# Patient Record
Sex: Female | Born: 1995 | Race: White | Hispanic: No | Marital: Single | Smoking: Current some day smoker
Health system: Southern US, Community
[De-identification: ages and names within clinical notes are randomized; demographics above are authoritative.]

## PROBLEM LIST (undated history)

## (undated) ENCOUNTER — Emergency Department: Discharge status: Absent without leave | Payer: Medicaid Other

## (undated) DIAGNOSIS — J309 Allergic rhinitis, unspecified: Secondary | ICD-10-CM

## (undated) DIAGNOSIS — E119 Type 2 diabetes mellitus without complications: Secondary | ICD-10-CM

## (undated) DIAGNOSIS — R569 Unspecified convulsions: Secondary | ICD-10-CM

## (undated) DIAGNOSIS — F909 Attention-deficit hyperactivity disorder, unspecified type: Secondary | ICD-10-CM

## (undated) DIAGNOSIS — K819 Cholecystitis, unspecified: Secondary | ICD-10-CM

## (undated) DIAGNOSIS — F319 Bipolar disorder, unspecified: Secondary | ICD-10-CM

## (undated) DIAGNOSIS — J45909 Unspecified asthma, uncomplicated: Secondary | ICD-10-CM

## (undated) HISTORY — PX: ADENOIDECTOMY: SUR15

## (undated) HISTORY — PX: TONSILLECTOMY: SUR1361

---

## 2016-10-15 ENCOUNTER — Emergency Department
Admission: EM | Admit: 2016-10-15 | Discharge: 2016-10-16 | Disposition: A | Discharge status: Home / Self Care | Payer: Medicaid - Out of State | Attending: Emergency Medicine | Admitting: Emergency Medicine

## 2016-10-15 DIAGNOSIS — J45909 Unspecified asthma, uncomplicated: Secondary | ICD-10-CM | POA: Insufficient documentation

## 2016-10-15 DIAGNOSIS — T7840XA Allergy, unspecified, initial encounter: Secondary | ICD-10-CM

## 2016-10-15 DIAGNOSIS — T782XXA Anaphylactic shock, unspecified, initial encounter: Secondary | ICD-10-CM | POA: Diagnosis not present

## 2016-10-15 DIAGNOSIS — Z87891 Personal history of nicotine dependence: Secondary | ICD-10-CM | POA: Diagnosis not present

## 2016-10-15 HISTORY — DX: Unspecified convulsions: R56.9

## 2016-10-15 HISTORY — DX: Unspecified asthma, uncomplicated: J45.909

## 2016-10-15 HISTORY — DX: Allergic rhinitis, unspecified: J30.9

## 2016-10-15 HISTORY — DX: Bipolar disorder, unspecified: F31.9

## 2016-10-16 LAB — BASIC METABOLIC PANEL
ANION GAP: 9 (ref 5–15)
BUN: 13 mg/dL (ref 6–20)
CHLORIDE: 105 mmol/L (ref 101–111)
CO2: 26 mmol/L (ref 22–32)
Calcium: 9.1 mg/dL (ref 8.9–10.3)
Creatinine, Ser: 0.77 mg/dL (ref 0.44–1.00)
GFR calc non Af Amer: >60 mL/min (ref >60)
GLUCOSE: 137 mg/dL — AB (ref 65–99)
POTASSIUM: 3.7 mmol/L (ref 3.5–5.1)
Sodium: 140 mmol/L (ref 135–145)

## 2016-10-16 LAB — CBC
HEMATOCRIT: 41 % (ref 35.0–47.0)
HEMOGLOBIN: 13.6 g/dL (ref 12.0–16.0)
MCH: 26.8 pg (ref 26.0–34.0)
MCHC: 33.1 g/dL (ref 32.0–36.0)
MCV: 81.1 fL (ref 80.0–100.0)
Platelets: 338 10*3/uL (ref 150–440)
RBC: 5.06 MIL/uL (ref 3.80–5.20)
RDW: 14.5 % (ref 11.5–14.5)
WBC: 23.6 10*3/uL — AB (ref 3.6–11.0)

## 2016-10-16 MED ORDER — EPINEPHRINE 0.3 MG/0.3ML IJ SOAJ
0.3000 mg | Freq: Once | INTRAMUSCULAR | Status: AC
  Administered 2016-10-16: 0.3 mg via INTRAMUSCULAR
  Filled 2016-10-16: qty 0.3

## 2016-10-16 MED ORDER — SODIUM CHLORIDE 0.9 % IV BOLUS (SEPSIS)
1000.0000 mL | Freq: Once | INTRAVENOUS | Status: AC
  Administered 2016-10-16: 1000 mL via INTRAVENOUS

## 2016-10-16 MED ORDER — ONDANSETRON HCL 4 MG/2ML IJ SOLN
4.0000 mg | Freq: Once | INTRAMUSCULAR | Status: AC
  Administered 2016-10-16: 4 mg via INTRAVENOUS

## 2016-10-16 MED ORDER — PREDNISONE 20 MG PO TABS: 60.0000 mg | ORAL_TABLET | Freq: Every day | ORAL | 0 refills | Status: DC

## 2016-10-16 MED ORDER — ONDANSETRON HCL 4 MG/2ML IJ SOLN
INTRAMUSCULAR | Status: AC
  Filled 2016-10-16: qty 2

## 2016-10-16 NOTE — ED Notes (Addendum)
Pt reports multiple allergies, and has hx of airborne allergies. Pt reports being around an opened box of ginger snaps and pt has hx of cinnamon allergy. Pt states once seeing ginger snaps she dev itchiness and "fire ant bite" sensation to body. Pt also reports SHOB and chest tightness. Pt given medication by EMS, on arrival pt reports breathing is better at this time and chest tightness has lessened.   Hx of hospitalization in the past due to recurrent allergic rxns.

## 2016-10-16 NOTE — ED Notes (Signed)
Per EDP, pt can drink water, pt drank water without difficulty.
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
## 2016-10-16 NOTE — Clinical Social Work Note (Signed)
CSW consulted for placement. Patient is from the homeless shelter and came in due to an allergic reaction she had to ginger snaps. Patient will need to have an Epi Pen obtained for her by RN CM. Patient will more than likely need to return to the homeless shelter at discharge from ED. CSW has left message for Gaye PollackRion Thompson: Outreach Coordinator: [phone] to discuss this further. CSW has updated patient's ED nurse, Vernona RiegerLaura. York SpanielMonica Jo-Ann Johanning MSW,LCSW [phone]

## 2016-10-16 NOTE — ED Notes (Signed)
Monica CSW spoke with Dr. Alphonzo LemmingsMcShane about pt plan of care.

## 2016-10-16 NOTE — ED Triage Notes (Signed)
Per EMS, pt had airborne allergic rxn to cinnamon. Pt states hx of airborne allergy and states she was around someone who opened ginger snaps and with pt's allergy to cinnamon she began to itch and "felt like fire ants all over my body" as well as SHOB and chest tightness. Per EMS, they gave 50 benadryl, .3epi, 125 solumedrol and pepcid. Pt states she is breathing better at this time and the "fire ants aren't as bad." Pt states last episode she had to be hospitalized. Pt able to swallow and maintain saliva at this time, 99% on RA. Pt able to answer questions without difficulty at this time.

## 2016-10-16 NOTE — ED Notes (Signed)
Spoke with Liberty from CSW. She states that if we are able to provide an epipen for the patient, she should be able to return to shelter. MD and Elnita Maxwell with case management made aware.

## 2016-10-16 NOTE — ED Notes (Signed)
Pt reports feeling nauseous, EDP notified, see MAR for follow up.

## 2016-10-16 NOTE — Care Management Note (Signed)
Case Management Note  Patient Details  Name: Sabrina HatchetJade Germond MRN: [id number] Date of Birth: 03-Jan-1996  Subjective/Objective:     Called pharmacy about pending medication assist with Epipen, and the pharmacy has now stocked out Pyxis. So the MD will write an order for it to be dispensed. RN for the patient is aware. No further needs identified.               Action/Plan:   Expected Discharge Date:                  Expected Discharge Plan:     In-House Referral:     Discharge planning Services     Post Acute Care Choice:    Choice offered to:     DME Arranged:    DME Agency:     HH Arranged:    HH Agency:     Status of Service:     If discussed at [employer] Length of Stay Meetings, dates discussed:    Additional Comments:  Berna BueCheryl Janssen Zee, RN 10/16/2016, 11:13 AM

## 2016-10-16 NOTE — ED Notes (Signed)
Monica from CSW stated that pt and mom wanted to leave and "take a chance" with being able to go back to homeless shelter. Mom drove to ED and will take pt back to gather belongings from homeless shelter. Dr. Alphonzo LemmingsMcShane updated and writing DC papers.

## 2016-10-16 NOTE — ED Notes (Signed)
[facility] Production designer, theatre/television/filmmanager about epi-pen for pt. CSW Maxine GlennMonica has stated that pt will need epi-pen to return to homeless shelter. Cheryl called pharmacy about sending prescription for epi-pen to be filled and then sent up to ED for pt to take with her. Pharmacy stated that ED has epi-pens in pyxis and the doctor could write for this RN to pull epi-pen out of pyxis and give to pt upon DC. Informed Dr. Alphonzo LemmingsMcShane about this plan. He and Dr. Cyril LoosenKinner verbalized concern about epi-pen from pyxis. Cheryl case manager talked to both doctors and informed that is what pharmacy stated to her. Both MD's verbalized understanding.
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
## 2016-10-20 ENCOUNTER — Emergency Department
Admission: EM | Admit: 2016-10-20 | Discharge: 2016-10-20 | Disposition: A | Discharge status: Home / Self Care | Payer: Medicaid - Out of State | Attending: Emergency Medicine | Admitting: Emergency Medicine

## 2016-10-20 ENCOUNTER — Emergency Department: Payer: Medicaid - Out of State

## 2016-10-20 ENCOUNTER — Emergency Department
Admission: EM | Admit: 2016-10-20 | Discharge: 2016-10-20 | Disposition: A | Discharge status: Home / Self Care | Payer: Medicaid Other | Attending: Emergency Medicine | Admitting: Emergency Medicine

## 2016-10-20 ENCOUNTER — Encounter: Payer: Self-pay | Admitting: Emergency Medicine

## 2016-10-20 DIAGNOSIS — Z87891 Personal history of nicotine dependence: Secondary | ICD-10-CM | POA: Insufficient documentation

## 2016-10-20 DIAGNOSIS — Y929 Unspecified place or not applicable: Secondary | ICD-10-CM | POA: Diagnosis not present

## 2016-10-20 DIAGNOSIS — S93402A Sprain of unspecified ligament of left ankle, initial encounter: Secondary | ICD-10-CM | POA: Diagnosis not present

## 2016-10-20 DIAGNOSIS — Y999 Unspecified external cause status: Secondary | ICD-10-CM | POA: Diagnosis not present

## 2016-10-20 DIAGNOSIS — J45909 Unspecified asthma, uncomplicated: Secondary | ICD-10-CM | POA: Diagnosis not present

## 2016-10-20 DIAGNOSIS — S99912A Unspecified injury of left ankle, initial encounter: Secondary | ICD-10-CM | POA: Diagnosis present

## 2016-10-20 DIAGNOSIS — T7840XA Allergy, unspecified, initial encounter: Secondary | ICD-10-CM | POA: Diagnosis not present

## 2016-10-20 DIAGNOSIS — W19XXXA Unspecified fall, initial encounter: Secondary | ICD-10-CM | POA: Insufficient documentation

## 2016-10-20 DIAGNOSIS — R600 Localized edema: Secondary | ICD-10-CM | POA: Diagnosis present

## 2016-10-20 DIAGNOSIS — Y939 Activity, unspecified: Secondary | ICD-10-CM | POA: Insufficient documentation

## 2016-10-20 MED ORDER — FAMOTIDINE IN NACL 20-0.9 MG/50ML-% IV SOLN
20.0000 mg | Freq: Once | INTRAVENOUS | Status: AC
  Administered 2016-10-20: 20 mg via INTRAVENOUS
  Filled 2016-10-20: qty 50

## 2016-10-20 MED ORDER — OXYCODONE-ACETAMINOPHEN 7.5-325 MG PO TABS: 1.0000 | ORAL_TABLET | Freq: Four times a day (QID) | ORAL | 0 refills | Status: DC | PRN

## 2016-10-20 MED ORDER — OXYCODONE-ACETAMINOPHEN 5-325 MG PO TABS
1.0000 | ORAL_TABLET | Freq: Once | ORAL | Status: AC
  Administered 2016-10-20: 1 via ORAL
  Filled 2016-10-20: qty 1

## 2016-10-20 MED ORDER — METHYLPREDNISOLONE SODIUM SUCC 125 MG IJ SOLR
125.0000 mg | Freq: Once | INTRAMUSCULAR | Status: AC
  Administered 2016-10-20: 125 mg via INTRAVENOUS
  Filled 2016-10-20: qty 2

## 2016-10-20 MED ORDER — PREDNISONE 50 MG PO TABS: 50.0000 mg | ORAL_TABLET | Freq: Every day | ORAL | 0 refills | Status: DC

## 2016-10-20 NOTE — ED Notes (Signed)
Discussed with patient medication/prescription. Pt stated she discussed this with the MD. Did not like or want prednisone and probably would not fill prescription anyways. This nurse asked to clarify, however pt stated "no, I just want to be discharged/ go home".

## 2016-10-20 NOTE — ED Provider Notes (Signed)
[facility] Emergency Department Provider Note   ____________________________________________   First MD Initiated Contact with Patient 10/20/16 2138     (approximate)  I have reviewed the triage vital signs and the nursing notes.   HISTORY  Chief Complaint Fall and Ankle Pain  HPI Sabrina Blevins is a 21 y.o. female Patient presents with left ankle pain secondary to a fall. Patient has a went beneath her and she fell on her ankle. Patient stated this obvious deformity. Patient rates the pain between 7 and 8/10. Patient immediately ice the ankle after fall. No other palliative measures for complaint.   Past Medical History:  Diagnosis Date  . Airborne allergy, current reaction   . Asthma   . Bipolar disorder (HCC)   . Seizures (HCC)     There are no active problems to display for this patient.   Past Surgical History:  Procedure Laterality Date  . ADENOIDECTOMY    . TONSILLECTOMY      Prior to Admission medications   Medication Sig Start Date End Date Taking? Authorizing Provider  oxyCODONE-acetaminophen (PERCOCET) 7.5-325 MG tablet Take 1 tablet by mouth every 6 (six) hours as needed for severe pain. 10/20/16   Joni Reining, PA-C  predniSONE (DELTASONE) 50 MG tablet Take 1 tablet (50 mg total) by mouth daily with breakfast. 10/20/16   Jene Every, MD    Allergies Bee venom; Cinnamon; Contrast media [iodinated diagnostic agents]; Motrin [ibuprofen]; Nutmeg oil (myristica oil); Penicillins; and Topamax [topiramate]  No family history on file.  Social History Social History  Substance Use Topics  . Smoking status: Former Games developer  . Smokeless tobacco: Never Used  . Alcohol use No    Review of Systems  Constitutional: No fever/chills Eyes: No visual changes. ENT: No sore throat. Cardiovascular: Denies chest pain. Respiratory: Denies shortness of breath. Gastrointestinal: No abdominal pain.  No nausea, no vomiting.  No diarrhea.  No  constipation. Genitourinary: Negative for dysuria. Musculoskeletal: Negative for back pain. Skin: Negative for rash. Neurological: Negative for headaches, focal weakness or numbness. Psychiatric:Bipolar Allergic/Immunilogical: See medication list ____________________________________________   PHYSICAL EXAM:  VITAL SIGNS: ED Triage Vitals  Enc Vitals Group     BP --      Pulse Rate 10/20/16 2118 (!) 125     Resp 10/20/16 2118 20     Temp 10/20/16 2118 98.2 F (36.8 C)     Temp Source 10/20/16 2118 Oral     SpO2 10/20/16 2118 98 %     Weight --      Height --      Head Circumference --      Peak Flow --      Pain Score 10/20/16 2117 8     Pain Loc --      Pain Edu? --      Excl. in GC? --     Constitutional: Alert and oriented. Well appearing and in no acute distress. Morbid obesity Cardiovascular: Initial tachycardic have retaken pulse which is in normal range. Grossly normal heart sounds.  Good peripheral circulation. Respiratory: Normal respiratory effort.  No retractions. Lungs CTAB. Musculoskeletal: No obvious deformity to the left ankle in comparison to the right. Patient is moderate edema to the medial aspect of the left ankle. Patient unable to weight-bear secondary to complain of pain.  Neurologic:  Normal speech and language. No gross focal neurologic deficits are appreciated. No gait instability. Skin:  Skin is warm, dry and intact. No rash noted. Psychiatric: Mood  and affect are normal. Speech and behavior are normal.  ____________________________________________   LABS (all labs ordered are listed, but only abnormal results are displayed)  Labs Reviewed - No data to display ____________________________________________  EKG   ____________________________________________  RADIOLOGY  Dg Ankle Complete Left  Result Date: 10/20/2016 CLINICAL DATA:  Acute left ankle pain and swelling following fall. Initial encounter. EXAM: LEFT ANKLE COMPLETE - 3+ VIEW  COMPARISON:  None. FINDINGS: There is no evidence of acute fracture, subluxation or dislocation. Medial soft tissue swelling is noted. No suspicious focal bony lesions are present. The joint spaces are unremarkable. IMPRESSION: Soft tissue swelling without acute bony abnormality. Electronically Signed   By: Harmon PierJeffrey  Hu M.D.   On: 10/20/2016 21:56    ____________________________________________   PROCEDURES  Procedure(s) performed: None  Procedures  Critical Care performed: No  ____________________________________________   INITIAL IMPRESSION / ASSESSMENT AND PLAN / ED COURSE  Pertinent labs & imaging results that were available during my care of the patient were reviewed by me and considered in my medical decision making (see chart for details).  Left ankle pain secondary to sprain. Discussed x-ray finding with patient. Patient placed in a ankle stirrup splint and given crutches to assist ambulation. Patient given a work note for 2 days. [facility].      ____________________________________________   FINAL CLINICAL IMPRESSION(S) / ED DIAGNOSES  Final diagnoses:  Sprain of left ankle, unspecified ligament, initial encounter      NEW MEDICATIONS STARTED DURING THIS VISIT:  Discharge Medication List as of 10/20/2016 10:08 PM    START taking these medications   Details  oxyCODONE-acetaminophen (PERCOCET) 7.5-325 MG tablet Take 1 tablet by mouth every 6 (six) hours as needed for severe pain., Starting Sun 10/20/2016, Print         Note:  This document was prepared using Dragon voice recognition software and may include unintentional dictation errors.    Joni ReiningSmith, Ronald K, PA-C 10/20/16 2223    Sharman CheekStafford, Phillip, MD 10/20/16 [phone]

## 2016-10-20 NOTE — ED Triage Notes (Signed)
Patient presents with deformity to left ankle after a fall. States her left leg went behind her and she fell on her ankle.

## 2016-10-20 NOTE — ED Provider Notes (Signed)
[facility] Emergency Department Provider Note   ____________________________________________    I have reviewed the triage vital signs and the nursing notes.   HISTORY  Chief Complaint Allergic Reaction     HPI Sabrina Blevins is a 21 y.o. female who presents after an allergic reaction. Patient reports she has an allergy to cinnamon and she smelled it today and developed a sensation of her skin being on fire which she states is typical of her allergic reaction. She also reports some mild facial swelling. No throat swelling or difficulty breathing. Her mother gave her an EpiPen and she had 50 mg of by mouth Benadryl as well. Currently she reports she is feeling better   Past Medical History:  Diagnosis Date  . Airborne allergy, current reaction   . Asthma   . Bipolar disorder (HCC)   . Seizures (HCC)     There are no active problems to display for this patient.   Past Surgical History:  Procedure Laterality Date  . ADENOIDECTOMY    . TONSILLECTOMY      Prior to Admission medications   Medication Sig Start Date End Date Taking? Authorizing Provider  predniSONE (DELTASONE) 20 MG tablet Take 3 tablets (60 mg total) by mouth daily. 10/16/16  Yes Jeanmarie PlantMcShane, James A, MD     Allergies Bee venom; Cinnamon; Contrast media [iodinated diagnostic agents]; Motrin [ibuprofen]; Nutmeg oil (myristica oil); Penicillins; and Topamax [topiramate]  History reviewed. No pertinent family history.  Social History Social History  Substance Use Topics  . Smoking status: Former Games developermoker  . Smokeless tobacco: Never Used  . Alcohol use No    Review of Systems  Constitutional: No Dizziness Eyes: No Swelling  ENT: No Throat swelling Cardiovascular: Denies chest pain. Respiratory: Denies shortness of breath. Gastrointestinal: No abdominal pain.  No nausea, no vomiting.    Musculoskeletal: Negative for back pain. Skin: Negative for rash. Neurological: Negative  for headaches    ____________________________________________   PHYSICAL EXAM:  VITAL SIGNS: ED Triage Vitals  Enc Vitals Group     BP 10/20/16 1015 (!) 136/98     Pulse Rate 10/20/16 1015 81     Resp 10/20/16 1015 18     Temp 10/20/16 1019 98.3 F (36.8 C)     Temp Source 10/20/16 1015 Oral     SpO2 10/20/16 1015 99 %     Weight 10/20/16 1015 136.1 kg (300 lb)     Height 10/20/16 1015 1.702 m (5\' 7" )     Head Circumference --      Peak Flow --      Pain Score 10/20/16 1015 0     Pain Loc --      Pain Edu? --      Excl. in GC? --     Constitutional: Alert and oriented. No acute distress.  Eyes: Conjunctivae are normal.   Nose: No congestion/rhinnorhea. Mouth/Throat: Mucous membranes are moist. Pharynx without swelling  Neck:  Painless ROM, no stridor Cardiovascular: Normal rate, regular rhythm. Good peripheral circulation. Respiratory: Normal respiratory effort.  No retractions. Lungs CTAB. Gastrointestinal: Soft and nontender.  Genitourinary: deferred Musculoskeletal: .  Warm and well perfused Neurologic:  Normal speech and language. No gross focal neurologic deficits are appreciated.  Skin:  Skin is warm, dry and intact. No rash noted. Psychiatric: Mood and affect are normal. Speech and behavior are normal.  ____________________________________________   LABS (all labs ordered are listed, but only abnormal results are displayed)  Labs Reviewed -  No data to display ____________________________________________  EKG  None ____________________________________________  RADIOLOGY  None ____________________________________________   PROCEDURES  Procedure(s) performed: No    Critical Care performed: No ____________________________________________   INITIAL IMPRESSION / ASSESSMENT AND PLAN / ED COURSE  Pertinent labs & imaging results that were available during my care of the patient were reviewed by me and considered in my medical decision making  (see chart for details).  Patient well-appearing and in no acute distress currently. We will give IV steroids IV Pepcid and monitor in the emergency department  Patient monitored with no recurrence of symptoms. Will d/c with prednisone   ____________________________________________   FINAL CLINICAL IMPRESSION(S) / ED DIAGNOSES  Final diagnoses:  Allergic reaction, initial encounter      NEW MEDICATIONS STARTED DURING THIS VISIT:  New Prescriptions   No medications on file     Note:  This document was prepared using Dragon voice recognition software and may include unintentional dictation errors.    Jene Every, MD 10/20/16 [phone]

## 2016-10-20 NOTE — ED Triage Notes (Addendum)
Pt presents to ED via [facility] c/o allergic reaction. Pt reports she has an airborne allegy to cinnamon. Is currently staying at a shelter with mother and shelter staff made cinnamon rolls this morning that pt could smell from down the hall. Pt states reaction felt "like fire ants all over my body." No hives or difficulty breathing noted at this time. Mother gave pt 50mg  benadryl around 0930 and an epi-pen injection at 0950.

## 2016-10-20 NOTE — Discharge Instructions (Signed)
Wear splint and ambulate with support for 2-3 days as needed.

## 2016-10-29 ENCOUNTER — Emergency Department: Admission: EM | Admit: 2016-10-29 | Payer: Self-pay | Source: Home / Self Care

## 2016-10-30 ENCOUNTER — Encounter: Payer: Self-pay | Admitting: Emergency Medicine

## 2016-10-30 ENCOUNTER — Emergency Department
Admission: EM | Admit: 2016-10-30 | Discharge: 2016-10-30 | Disposition: A | Discharge status: Home / Self Care | Payer: Medicaid - Out of State | Attending: Emergency Medicine | Admitting: Emergency Medicine

## 2016-10-30 DIAGNOSIS — L509 Urticaria, unspecified: Secondary | ICD-10-CM | POA: Diagnosis present

## 2016-10-30 DIAGNOSIS — J45909 Unspecified asthma, uncomplicated: Secondary | ICD-10-CM | POA: Diagnosis not present

## 2016-10-30 DIAGNOSIS — T7840XA Allergy, unspecified, initial encounter: Secondary | ICD-10-CM | POA: Insufficient documentation

## 2016-10-30 DIAGNOSIS — Z87891 Personal history of nicotine dependence: Secondary | ICD-10-CM | POA: Diagnosis not present

## 2016-10-30 MED ORDER — FAMOTIDINE IN NACL 20-0.9 MG/50ML-% IV SOLN
20.0000 mg | Freq: Once | INTRAVENOUS | Status: AC
  Administered 2016-10-30: 20 mg via INTRAVENOUS
  Filled 2016-10-30: qty 50

## 2016-10-30 MED ORDER — EPINEPHRINE 0.3 MG/0.3ML IJ SOAJ
0.3000 mg | Freq: Once | INTRAMUSCULAR | Status: AC
  Administered 2016-10-30: 0.3 mg via INTRAMUSCULAR
  Filled 2016-10-30: qty 0.3

## 2016-10-30 MED ORDER — PREDNISONE 20 MG PO TABS: 60.0000 mg | ORAL_TABLET | Freq: Every day | ORAL | 0 refills | Status: AC

## 2016-10-30 MED ORDER — DIPHENHYDRAMINE HCL 50 MG/ML IJ SOLN
25.0000 mg | Freq: Once | INTRAMUSCULAR | Status: AC
  Administered 2016-10-30: 25 mg via INTRAVENOUS
  Filled 2016-10-30: qty 1

## 2016-10-30 NOTE — ED Notes (Signed)
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
## 2016-10-30 NOTE — ED Notes (Signed)
Pt calls out requesting RN. THis RN to room, pt c/o worsening rash to face and chest. Rash appears to be unchanged by this RN, MD consulted. MD agreed, but complied with giving pt additional meds as requested.

## 2016-10-30 NOTE — ED Notes (Signed)
Mother at bedside and pt voice continual concerns for need for admission, need for shelter, need for protection. This rn and md reassure pt and family additional resource attempts to be made.

## 2016-10-30 NOTE — ED Triage Notes (Addendum)
Pt bib [facility] from homeless shelter where she is currently staying with her mother. Pt recently seen here for same. Pt exposed to cinnamon, c/o chest tightness, bright red hives (reddened puffy skin on arrival), clear lungs. 50 benadryl given IV, 125 solumedrol though IV. #20 left AC established. Pt in NAD upon arrival

## 2016-11-16 ENCOUNTER — Emergency Department
Admission: EM | Admit: 2016-11-16 | Discharge: 2016-11-16 | Disposition: A | Discharge status: Home / Self Care | Payer: Medicaid - Out of State | Attending: Emergency Medicine | Admitting: Emergency Medicine

## 2016-11-16 ENCOUNTER — Encounter: Payer: Self-pay | Admitting: Emergency Medicine

## 2016-11-16 DIAGNOSIS — Z87891 Personal history of nicotine dependence: Secondary | ICD-10-CM | POA: Insufficient documentation

## 2016-11-16 DIAGNOSIS — S90462A Insect bite (nonvenomous), left great toe, initial encounter: Secondary | ICD-10-CM | POA: Diagnosis not present

## 2016-11-16 DIAGNOSIS — Y929 Unspecified place or not applicable: Secondary | ICD-10-CM | POA: Insufficient documentation

## 2016-11-16 DIAGNOSIS — Y939 Activity, unspecified: Secondary | ICD-10-CM | POA: Diagnosis not present

## 2016-11-16 DIAGNOSIS — Y999 Unspecified external cause status: Secondary | ICD-10-CM | POA: Insufficient documentation

## 2016-11-16 DIAGNOSIS — W57XXXA Bitten or stung by nonvenomous insect and other nonvenomous arthropods, initial encounter: Secondary | ICD-10-CM | POA: Diagnosis not present

## 2016-11-16 DIAGNOSIS — J45909 Unspecified asthma, uncomplicated: Secondary | ICD-10-CM | POA: Insufficient documentation

## 2016-11-16 DIAGNOSIS — R600 Localized edema: Secondary | ICD-10-CM | POA: Diagnosis present

## 2016-11-16 LAB — GLUCOSE, CAPILLARY: Glucose-Capillary: 173 mg/dL — ABNORMAL HIGH (ref 65–99)

## 2016-11-16 MED ORDER — DIPHENHYDRAMINE HCL 25 MG PO CAPS
50.0000 mg | ORAL_CAPSULE | Freq: Once | ORAL | Status: AC
  Administered 2016-11-16: 50 mg via ORAL
  Filled 2016-11-16: qty 2

## 2016-11-16 MED ORDER — ACETAMINOPHEN 500 MG PO TABS
1000.0000 mg | ORAL_TABLET | Freq: Once | ORAL | Status: AC
  Administered 2016-11-16: 1000 mg via ORAL
  Filled 2016-11-16: qty 2

## 2016-11-16 MED ORDER — TRIAMCINOLONE ACETONIDE 0.5 % EX OINT: 1.0000 "application " | TOPICAL_OINTMENT | Freq: Two times a day (BID) | CUTANEOUS | 0 refills | Status: DC

## 2016-11-16 NOTE — ED Triage Notes (Signed)
Pt to ed with c/o ?insect bite to left foot first toe.  Small amount of redness noted. No swelling noted.

## 2016-11-16 NOTE — ED Notes (Signed)
Entered pt's room to administer tylenol and benadryl. Pt informed of names of medications before taking them. After taking pt asked if the benadryl was name brand or generic because she has a swelling reaction to generic. Unsure which formulation is used. PA notified and benadryl added to allergy list.

## 2016-11-16 NOTE — ED Provider Notes (Signed)
[facility] Emergency Department Provider Note   ____________________________________________   First MD Initiated Contact with Patient 11/16/16 1326     (approximate)  I have reviewed the triage vital signs and the nursing notes.   HISTORY  Chief Complaint Insect Bite   HPI Sabrina Blevins is a 21 y.o. female is here with complaint of possible insect bite to her left great toe. Patient states that swallowing she felt something that was sharp. Since that time she has had pain and swelling to the area. Patient is fearful that her toe may become infected. Patient denies any difficulty breathing or swallowing. Currently she complains of itching and pain. Patient has a history of "borderline diabetes". Currently she rates her pain as 6/10.   Past Medical History:  Diagnosis Date  . Airborne allergy, current reaction   . Asthma   . Bipolar disorder (HCC)   . Seizures (HCC)     There are no active problems to display for this patient.   Past Surgical History:  Procedure Laterality Date  . ADENOIDECTOMY    . TONSILLECTOMY      Prior to Admission medications   Medication Sig Start Date End Date Taking? Authorizing Provider  oxyCODONE-acetaminophen (PERCOCET) 7.5-325 MG tablet Take 1 tablet by mouth every 6 (six) hours as needed for severe pain. 10/20/16   Joni Reining, PA-C  predniSONE (DELTASONE) 50 MG tablet Take 1 tablet (50 mg total) by mouth daily with breakfast. 10/20/16   Jene Every, MD  triamcinolone ointment (KENALOG) 0.5 % Apply 1 application topically 2 (two) times daily. 11/16/16   Tommi Rumps, PA-C    Allergies Bee venom; Cinnamon; Contrast media [iodinated diagnostic agents]; Motrin [ibuprofen]; Nutmeg oil (myristica oil); Penicillins; Benadryl [diphenhydramine hcl]; and Topamax [topiramate]  History reviewed. No pertinent family history.  Social History Social History  Substance Use Topics  . Smoking status: Former Games developer  .  Smokeless tobacco: Never Used  . Alcohol use No    Review of Systems  Constitutional: No fever/chills Cardiovascular: Denies chest pain. Respiratory: Denies shortness of breath. Gastrointestinal: No abdominal pain.  No nausea, no vomiting.   Musculoskeletal: Pain left great toe. Skin: Positive for erythema left great toe. Neurological: Negative for headaches, focal weakness or numbness. ____________________________________________   PHYSICAL EXAM:  VITAL SIGNS: ED Triage Vitals  Enc Vitals Group     BP 11/16/16 1303 111/82     Pulse Rate 11/16/16 1303 97     Resp 11/16/16 1303 18     Temp 11/16/16 1303 97.9 F (36.6 C)     Temp Source 11/16/16 1303 Oral     SpO2 11/16/16 1303 99 %     Weight 11/16/16 1304 300 lb (136.1 kg)     Height --      Head Circumference --      Peak Flow --      Pain Score 11/16/16 1303 6     Pain Loc --      Pain Edu? --      Excl. in GC? --    Constitutional: Alert and oriented. Well appearing and in no acute distress. Eyes: Conjunctivae are normal.  Head: Atraumatic. Nose: No congestion/rhinnorhea. Neck: No stridor.   Cardiovascular: Normal rate, regular rhythm. Grossly normal heart sounds.  Good peripheral circulation. Respiratory: Normal respiratory effort.  No retractions. Lungs CTAB. Musculoskeletal: No lower extremity tenderness nor edema.  No joint effusions. Neurologic:  Normal speech and language. No gross focal neurologic deficits are appreciated.  No gait instability. Skin:  Skin is warm, dry.  Dorsal aspect of left great toe is erythematous and tender to touch. There is 2 small superficial open areas without any bleeding or drainage. Motor sensory function intact and capillary refill is less than 3 seconds. Psychiatric: Mood and affect are normal. Speech and behavior are normal.  ____________________________________________   LABS (all labs ordered are listed, but only abnormal results are displayed)  Labs Reviewed  GLUCOSE,  CAPILLARY - Abnormal; Notable for the following:       Result Value   Glucose-Capillary 173 (*)    All other components within normal limits  CBG MONITORING, ED     PROCEDURES  Procedure(s) performed: None  Procedures  Critical Care performed: No  ____________________________________________   INITIAL IMPRESSION / ASSESSMENT AND PLAN / ED COURSE  Pertinent labs & imaging results that were available during my care of the patient were reviewed by me and considered in my medical decision making (see chart for details).  Patient was given Benadryl and Tylenol while in the department for pain and itching. She is given a prescription for Kenalog cream to apply to area as needed for itching. She is continue taking Tylenol as needed for pain. She is to follow-up with her PCP if any continued problems or one of  the clinics listed on her discharge papers. Patient also was made aware that her nonfasting blood sugar was elevated and should be rechecked.      ____________________________________________   FINAL CLINICAL IMPRESSION(S) / ED DIAGNOSES  Final diagnoses:  Insect bite, initial encounter      NEW MEDICATIONS STARTED DURING THIS VISIT:  Discharge Medication List as of 11/16/2016  3:03 PM    START taking these medications   Details  triamcinolone ointment (KENALOG) 0.5 % Apply 1 application topically 2 (two) times daily., Starting Sat 11/16/2016, Print         Note:  This document was prepared using Dragon voice recognition software and may include unintentional dictation errors.    Tommi Rumps, PA-C 11/16/16 Silva Bandy    Merrily Brittle, MD 11/17/16 1055

## 2016-11-16 NOTE — Discharge Instructions (Signed)
Follow-up with one of the 2 clinics listed on her discharge papers. Begin using triamcinolone cream to the area and take Tylenol as needed for pain.

## 2017-01-11 ENCOUNTER — Encounter: Payer: Self-pay | Admitting: Emergency Medicine

## 2017-01-11 ENCOUNTER — Other Ambulatory Visit: Payer: Self-pay

## 2017-01-11 DIAGNOSIS — F17210 Nicotine dependence, cigarettes, uncomplicated: Secondary | ICD-10-CM | POA: Diagnosis not present

## 2017-01-11 DIAGNOSIS — R112 Nausea with vomiting, unspecified: Secondary | ICD-10-CM | POA: Insufficient documentation

## 2017-01-11 DIAGNOSIS — E860 Dehydration: Secondary | ICD-10-CM | POA: Diagnosis not present

## 2017-01-11 DIAGNOSIS — R42 Dizziness and giddiness: Secondary | ICD-10-CM | POA: Insufficient documentation

## 2017-01-11 DIAGNOSIS — J45909 Unspecified asthma, uncomplicated: Secondary | ICD-10-CM | POA: Diagnosis not present

## 2017-01-11 DIAGNOSIS — Z79899 Other long term (current) drug therapy: Secondary | ICD-10-CM | POA: Diagnosis not present

## 2017-01-11 DIAGNOSIS — K219 Gastro-esophageal reflux disease without esophagitis: Secondary | ICD-10-CM | POA: Diagnosis not present

## 2017-01-11 DIAGNOSIS — R1013 Epigastric pain: Secondary | ICD-10-CM | POA: Insufficient documentation

## 2017-01-11 LAB — BASIC METABOLIC PANEL
ANION GAP: 10 (ref 5–15)
BUN: 17 mg/dL (ref 6–20)
CALCIUM: 9.1 mg/dL (ref 8.9–10.3)
CO2: 24 mmol/L (ref 22–32)
Chloride: 103 mmol/L (ref 101–111)
Creatinine, Ser: 0.7 mg/dL (ref 0.44–1.00)
GFR calc non Af Amer: >60 mL/min (ref >60)
Glucose, Bld: 171 mg/dL — ABNORMAL HIGH (ref 65–99)
POTASSIUM: 3.8 mmol/L (ref 3.5–5.1)
SODIUM: 137 mmol/L (ref 135–145)

## 2017-01-11 LAB — URINALYSIS, COMPLETE (UACMP) WITH MICROSCOPIC
BILIRUBIN URINE: NEGATIVE
Bacteria, UA: NONE SEEN
Glucose, UA: NEGATIVE mg/dL
HGB URINE DIPSTICK: NEGATIVE
Ketones, ur: NEGATIVE mg/dL
LEUKOCYTES UA: NEGATIVE
NITRITE: NEGATIVE
PH: 5 (ref 5.0–8.0)
Protein, ur: NEGATIVE mg/dL
SPECIFIC GRAVITY, URINE: 1.018 (ref 1.005–1.030)

## 2017-01-11 LAB — CBC
HCT: 43.1 % (ref 35.0–47.0)
HEMOGLOBIN: 14.5 g/dL (ref 12.0–16.0)
MCH: 27.3 pg (ref 26.0–34.0)
MCHC: 33.7 g/dL (ref 32.0–36.0)
MCV: 81.2 fL (ref 80.0–100.0)
Platelets: 336 10*3/uL (ref 150–440)
RBC: 5.31 MIL/uL — ABNORMAL HIGH (ref 3.80–5.20)
RDW: 14 % (ref 11.5–14.5)
WBC: 16.4 10*3/uL — AB (ref 3.6–11.0)

## 2017-01-11 LAB — POCT PREGNANCY, URINE: Preg Test, Ur: NEGATIVE

## 2017-01-11 LAB — TROPONIN I: Troponin I: <0.03 ng/mL (ref <0.03)

## 2017-01-11 NOTE — ED Triage Notes (Signed)
Pt reports that x 1 hour ago she has had dizziness with nausea. Pt states that she has ac ouple of episodes of emesis in the AM. Pt is ambulatory to triage with NAD.

## 2017-01-12 ENCOUNTER — Emergency Department
Admission: EM | Admit: 2017-01-12 | Discharge: 2017-01-12 | Disposition: A | Discharge status: Home / Self Care | Payer: Medicaid Other | Attending: Emergency Medicine | Admitting: Emergency Medicine

## 2017-01-12 ENCOUNTER — Emergency Department: Payer: Medicaid Other

## 2017-01-12 DIAGNOSIS — K219 Gastro-esophageal reflux disease without esophagitis: Secondary | ICD-10-CM

## 2017-01-12 DIAGNOSIS — E860 Dehydration: Secondary | ICD-10-CM

## 2017-01-12 DIAGNOSIS — R42 Dizziness and giddiness: Secondary | ICD-10-CM

## 2017-01-12 DIAGNOSIS — R1013 Epigastric pain: Secondary | ICD-10-CM

## 2017-01-12 LAB — LIPASE, BLOOD: Lipase: 27 U/L (ref 11–51)

## 2017-01-12 LAB — HEPATIC FUNCTION PANEL
ALBUMIN: 3.8 g/dL (ref 3.5–5.0)
ALT: 18 U/L (ref 14–54)
AST: 17 U/L (ref 15–41)
Alkaline Phosphatase: 93 U/L (ref 38–126)
BILIRUBIN TOTAL: 0.3 mg/dL (ref 0.3–1.2)
Bilirubin, Direct: <0.1 mg/dL — ABNORMAL LOW (ref 0.1–0.5)
Total Protein: 7.7 g/dL (ref 6.5–8.1)

## 2017-01-12 LAB — GLUCOSE, CAPILLARY: GLUCOSE-CAPILLARY: 116 mg/dL — AB (ref 65–99)

## 2017-01-12 MED ORDER — ONDANSETRON HCL 4 MG/2ML IJ SOLN
4.0000 mg | Freq: Once | INTRAMUSCULAR | Status: AC
  Administered 2017-01-12: 4 mg via INTRAVENOUS
  Filled 2017-01-12: qty 2

## 2017-01-12 MED ORDER — FAMOTIDINE 20 MG PO TABS: 20.0000 mg | ORAL_TABLET | Freq: Two times a day (BID) | ORAL | 0 refills | Status: DC

## 2017-01-12 MED ORDER — ONDANSETRON 4 MG PO TBDP: 4.0000 mg | ORAL_TABLET | Freq: Three times a day (TID) | ORAL | 0 refills | Status: DC | PRN

## 2017-01-12 MED ORDER — SODIUM CHLORIDE 0.9 % IV BOLUS (SEPSIS)
1000.0000 mL | Freq: Once | INTRAVENOUS | Status: AC
  Administered 2017-01-12: 1000 mL via INTRAVENOUS

## 2017-01-12 MED ORDER — FAMOTIDINE IN NACL 20-0.9 MG/50ML-% IV SOLN
20.0000 mg | Freq: Once | INTRAVENOUS | Status: AC
  Administered 2017-01-12: 20 mg via INTRAVENOUS
  Filled 2017-01-12: qty 50

## 2017-01-12 NOTE — Discharge Instructions (Signed)
1.  Start Pepcid 20 mg twice daily (#60). 2.  You may take Zofran as needed for nausea. 3.  Drink plenty of fluids daily. 4.  Return to the ER for worsening symptoms, persistent vomiting, difficulty breathing or other concerns.

## 2017-01-12 NOTE — ED Notes (Signed)
Pt states she is eating and drinking today but less appetite than normal

## 2017-01-12 NOTE — ED Provider Notes (Signed)
[facility] Emergency Department Provider Note   ____________________________________________   First MD Initiated Contact with Patient 01/12/17 0041     (approximate)  I have reviewed the triage vital signs and the nursing notes.   HISTORY  Chief Complaint Dizziness  History obtained via patient and her mother  HPI Sabrina Blevins is a 21 y.o. female who presents to the ED from home with a chief complaint of dizziness.  Patient reports a several day history of epigastric discomfort which she describes as burning, waxing/waning and associated with nausea.  Reports history of "gallbladder issues".  Approximately 1 hour prior to arrival, patient stood up from using the restroom, experience dizziness with a sensation of room spinning, associated with nausea.  Exacerbated by positioning.  States she had a couple of episodes of emesis earlier yesterday morning.  Denies recent fever, chills, chest pain, shortness of breath, vomiting, dysuria, diarrhea.  Denies recent travel or trauma.   Past Medical History:  Diagnosis Date  . Airborne allergy, current reaction   . Asthma   . Bipolar disorder (HCC)   . Seizures (HCC)     There are no active problems to display for this patient.   Past Surgical History:  Procedure Laterality Date  . ADENOIDECTOMY    . TONSILLECTOMY      Prior to Admission medications   Medication Sig Start Date End Date Taking? Authorizing Provider  oxyCODONE-acetaminophen (PERCOCET) 7.5-325 MG tablet Take 1 tablet by mouth every 6 (six) hours as needed for severe pain. 10/20/16   Joni Reining, PA-C  predniSONE (DELTASONE) 50 MG tablet Take 1 tablet (50 mg total) by mouth daily with breakfast. 10/20/16   Jene Every, MD  triamcinolone ointment (KENALOG) 0.5 % Apply 1 application topically 2 (two) times daily. 11/16/16   Tommi Rumps, PA-C    Allergies Bee venom; Cinnamon; Contrast media [iodinated diagnostic agents]; Motrin  [ibuprofen]; Nutmeg oil (myristica oil); Penicillins; Benadryl [diphenhydramine hcl]; and Topamax [topiramate]  No family history on file.  Social History Social History   Tobacco Use  . Smoking status: Current Some Day Smoker  . Smokeless tobacco: Never Used  Substance Use Topics  . Alcohol use: No  . Drug use: No    Review of Systems  Constitutional: No fever/chills. Eyes: No visual changes. ENT: No sore throat. Cardiovascular: Denies chest pain. Respiratory: Denies shortness of breath. Gastrointestinal: Positive for abdominal pain and nausea, no vomiting.  No diarrhea.  No constipation. Genitourinary: Negative for dysuria. Musculoskeletal: Negative for back pain. Skin: Negative for rash. Neurological: Positive for dizziness.  Negative for headaches, focal weakness or numbness.   ____________________________________________   PHYSICAL EXAM:  VITAL SIGNS: ED Triage Vitals  Enc Vitals Group     BP 01/11/17 2132 (!) 143/79     Pulse Rate 01/11/17 2132 79     Resp 01/11/17 2132 18     Temp 01/11/17 2132 98.7 F (37.1 C)     Temp Source 01/11/17 2132 Oral     SpO2 01/11/17 2132 97 %     Weight 01/11/17 2132 (!) 310 lb (140.6 kg)     Height 01/11/17 2132 5\' 8"  (1.727 m)     Head Circumference --      Peak Flow --      Pain Score 01/11/17 2128 7     Pain Loc --      Pain Edu? --      Excl. in GC? --     Constitutional: Alert  and oriented. Well appearing and in no acute distress. Eyes: Conjunctivae are normal. PERRL. EOMI. Head: Atraumatic. Nose: No congestion/rhinnorhea. Mouth/Throat: Mucous membranes are moist.  Oropharynx non-erythematous. Neck: No stridor.  No carotid bruits. Cardiovascular: Normal rate, regular rhythm. Grossly normal heart sounds.  Good peripheral circulation. Respiratory: Normal respiratory effort.  No retractions. Lungs CTAB. Gastrointestinal: Obese.  Soft and mildly tender to palpation epigastrium without rebound or guarding. No  distention. No abdominal bruits. No CVA tenderness. Musculoskeletal: No lower extremity tenderness nor edema.  No joint effusions. Neurologic: Alert and oriented x4.  CN II-XII grossly intact.  Normal speech and language. No gross focal neurologic deficits are appreciated. MAEx4. No gait instability. Skin:  Skin is warm, dry and intact. No rash noted. Psychiatric: Mood and affect are normal. Speech and behavior are normal.  ____________________________________________   LABS (all labs ordered are listed, but only abnormal results are displayed)  Labs Reviewed  BASIC METABOLIC PANEL - Abnormal; Notable for the following components:      Result Value   Glucose, Bld 171 (*)    All other components within normal limits  CBC - Abnormal; Notable for the following components:   WBC 16.4 (*)    RBC 5.31 (*)    All other components within normal limits  URINALYSIS, COMPLETE (UACMP) WITH MICROSCOPIC - Abnormal; Notable for the following components:   Color, Urine YELLOW (*)    APPearance CLEAR (*)    Squamous Epithelial / LPF 0-5 (*)    All other components within normal limits  HEPATIC FUNCTION PANEL - Abnormal; Notable for the following components:   Bilirubin, Direct <0.1 (*)    All other components within normal limits  GLUCOSE, CAPILLARY - Abnormal; Notable for the following components:   Glucose-Capillary 116 (*)    All other components within normal limits  TROPONIN I  LIPASE, BLOOD  CBG MONITORING, ED  POC URINE PREG, ED  POCT PREGNANCY, URINE   ____________________________________________  EKG  ED ECG REPORT I, Kurtiss Wence,Kazi J, the attending physician, personally viewed and interpreted this ECG.   Date: 01/12/2017  EKG Time: 2129  Rate: 80  Rhythm: normal EKG, normal sinus rhythm  Axis: Normal  Intervals:none  ST&T Change: Nonspecific  ____________________________________________  RADIOLOGY  [place] Abdomen Limited Ruq  Result Date: 01/12/2017 CLINICAL DATA:   Epigastric pain, nausea, and dizziness for 1 day. EXAM: ULTRASOUND ABDOMEN LIMITED RIGHT UPPER QUADRANT COMPARISON:  None. FINDINGS: Gallbladder: No gallstones or wall thickening visualized. No sonographic Murphy sign noted by sonographer. Common bile duct: Diameter: 2.2 mm, normal Liver: Diffusely increased hepatic parenchymal echotexture likely representing fatty infiltration. No focal lesions identified, but due to poor penetration, focal lesions would be difficult to visualize. Portal vein is patent on color Doppler imaging with normal direction of blood flow towards the liver. IMPRESSION: No evidence of cholelithiasis or cholecystitis. Diffuse fatty infiltration of the liver. Electronically Signed   By: Burman NievesWilliam  Stevens M.D.   On: 01/12/2017 02:57    ____________________________________________   PROCEDURES  Procedure(s) performed: None  Procedures  Critical Care performed: No  ____________________________________________   INITIAL IMPRESSION / ASSESSMENT AND PLAN / ED COURSE  As part of my medical decision making, I reviewed the following data within the electronic MEDICAL RECORD NUMBER History obtained from family, Nursing notes reviewed and incorporated, Labs reviewed, EKG interpreted, Radiograph reviewed and Notes from prior ED visits.   21 year old female who presents with dizziness, epigastric discomfort and nausea. Differential diagnosis includes, but is not limited to, vertigo, TIA,  CVA, encephalopathy, orthostasis, biliary disease (biliary colic, acute cholecystitis, cholangitis, choledocholithiasis, etc), intrathoracic causes for epigastric abdominal pain including ACS, gastritis, duodenitis, pancreatitis, small bowel or large bowel obstruction, abdominal aortic aneurysm, hernia, and gastritis.  Initial laboratory urinalysis results remarkable for moderate leukocytosis which is fairly nonspecific.  Will add hepatic panel, lipase and obtain limited RUQ ultrasound.  Will obtain  orthostatic vital signs, infuse IV fluids, IV Pepcid and antiemetic.  Clinical Course as of Jan 13 356  Wynelle LinkSun Jan 12, 2017  [id number] Updated patient and her mother of laboratory and ultrasound results.  She was orthostatic.  Received 1 L IV fluids and has improved.  Strict return precautions given.  Both verbalize understanding and agree with plan of care.  [JS]    Clinical Course User Index [JS] Irean HongSung, Sharnae J, MD     ____________________________________________   FINAL CLINICAL IMPRESSION(S) / ED DIAGNOSES  Final diagnoses:  Dizziness  Epigastric pain  Gastroesophageal reflux disease, esophagitis presence not specified  Dehydration     ED Discharge Orders    None       Note:  This document was prepared using Dragon voice recognition software and may include unintentional dictation errors.    Irean HongSung, Lexus J, MD 01/12/17 [phone]

## 2017-01-12 NOTE — ED Notes (Signed)
Pt has not been feeling good most of today, nauseated. Mother states she came out of the bathroom tonight and was unsteady and fell against the table.

## 2017-02-10 ENCOUNTER — Emergency Department
Admission: EM | Admit: 2017-02-10 | Discharge: 2017-02-11 | Disposition: A | Discharge status: Home / Self Care | Payer: Medicaid Other | Attending: Emergency Medicine | Admitting: Emergency Medicine

## 2017-02-10 DIAGNOSIS — F17200 Nicotine dependence, unspecified, uncomplicated: Secondary | ICD-10-CM | POA: Insufficient documentation

## 2017-02-10 DIAGNOSIS — Z79899 Other long term (current) drug therapy: Secondary | ICD-10-CM | POA: Diagnosis not present

## 2017-02-10 DIAGNOSIS — R0600 Dyspnea, unspecified: Secondary | ICD-10-CM | POA: Diagnosis present

## 2017-02-10 DIAGNOSIS — L509 Urticaria, unspecified: Secondary | ICD-10-CM | POA: Diagnosis not present

## 2017-02-10 DIAGNOSIS — J45909 Unspecified asthma, uncomplicated: Secondary | ICD-10-CM | POA: Insufficient documentation

## 2017-02-10 DIAGNOSIS — T781XXA Other adverse food reactions, not elsewhere classified, initial encounter: Secondary | ICD-10-CM | POA: Insufficient documentation

## 2017-02-10 DIAGNOSIS — F319 Bipolar disorder, unspecified: Secondary | ICD-10-CM | POA: Insufficient documentation

## 2017-02-10 MED ORDER — SODIUM CHLORIDE 0.9 % IV BOLUS (SEPSIS)
500.0000 mL | Freq: Once | INTRAVENOUS | Status: AC
  Administered 2017-02-10: 500 mL via INTRAVENOUS

## 2017-02-10 MED ORDER — METHYLPREDNISOLONE SODIUM SUCC 125 MG IJ SOLR
INTRAMUSCULAR | Status: AC
  Filled 2017-02-10: qty 2

## 2017-02-10 MED ORDER — DIPHENHYDRAMINE HCL 50 MG/ML IJ SOLN
25.0000 mg | Freq: Once | INTRAMUSCULAR | Status: AC
  Administered 2017-02-10: 25 mg via INTRAVENOUS

## 2017-02-10 MED ORDER — FAMOTIDINE IN NACL 20-0.9 MG/50ML-% IV SOLN
20.0000 mg | Freq: Once | INTRAVENOUS | Status: AC
  Administered 2017-02-10: 20 mg via INTRAVENOUS

## 2017-02-10 MED ORDER — FAMOTIDINE IN NACL 20-0.9 MG/50ML-% IV SOLN
INTRAVENOUS | Status: AC
  Filled 2017-02-10: qty 50

## 2017-02-10 MED ORDER — DIPHENHYDRAMINE HCL 50 MG/ML IJ SOLN
INTRAMUSCULAR | Status: DC
Start: 2017-02-10 — End: 2017-02-11
  Filled 2017-02-10: qty 1

## 2017-02-10 MED ORDER — PREDNISONE 20 MG PO TABS: 40.0000 mg | ORAL_TABLET | Freq: Every day | ORAL | 0 refills | Status: DC

## 2017-02-10 MED ORDER — METHYLPREDNISOLONE SODIUM SUCC 125 MG IJ SOLR
125.0000 mg | INTRAMUSCULAR | Status: AC
  Administered 2017-02-10: 125 mg via INTRAVENOUS

## 2017-02-10 MED ORDER — EPINEPHRINE 0.3 MG/0.3ML IJ SOAJ: 0.3000 mg | Freq: Once | INTRAMUSCULAR | 0 refills | Status: AC

## 2017-02-10 NOTE — ED Notes (Signed)
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 Was a [employer] and starting feeling her throat swelling

## 2017-02-10 NOTE — ED Triage Notes (Signed)
Pt came in via POV.  Pt brought straight back to room.  Pt states she was eating spaghetti at [employer] and started to develop a raspy voice and itching.  Pt allergic to cinnamon.  Pt unsure if she was exposed.  Pt states she took 50mg  benadryl at restaurant.  Pt is A&Ox4, tachypneic and shaky at this time.  EDP notified and ordered placed.

## 2017-02-10 NOTE — ED Notes (Signed)
EDP stating that he does not feel like pt requires epi pen at this time.

## 2017-02-10 NOTE — Discharge Instructions (Signed)
You have been seen in the Emergency Department (ED) today for an allergic reaction.  You have been stable throughout your stay in the Emergency Department.  Please take your medications as prescribed and follow up with your doctor as indicated.  You should also take over-the-counter Benadryl (BRAND NAME ONLY) around the clock for the next three days according to the dosing instructions on the package.  Please keep your Epi-Pen with you at all times and use it if experience shortness of breath or difficulty breathing or if you believe you are having a severe allergic reaction.  If you use the Epi-Pen, though, please call 911 afterwards or go immediately to your nearest Emergency Department.  Return to the Emergency Department (ED) if you experience any worsening or new symptoms that concern you.

## 2017-02-10 NOTE — ED Provider Notes (Signed)
[facility] Emergency Department Provider Note   ____________________________________________   First MD Initiated Contact with Patient 02/10/17 2149     (approximate)  I have reviewed the triage vital signs and the nursing notes.   HISTORY  Chief Complaint Allergic Reaction    HPI Sabrina Blevins is a 21 y.o. 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  She did not have an EpiPen with her, but did take 2 adult dose Benadryl prior to arrival.  She reports her symptoms seem to be slightly better at this time and she also had a red rash and some hives appearing around her mouth.  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  No wheezing or trouble breathing except for feeling of her throat closing.  No nausea or vomiting.  No fevers or chills or headache.  Past Medical History:  Diagnosis Date  . Airborne allergy, current reaction   . Asthma   . Bipolar disorder (HCC)   . Seizures (HCC)     There are no active problems to display for this patient.   Past Surgical History:  Procedure Laterality Date  . ADENOIDECTOMY    . TONSILLECTOMY      Prior to Admission medications   Medication Sig Start Date End Date Taking? Authorizing Provider  famotidine (PEPCID) 20 MG tablet Take 1 tablet (20 mg total) by mouth 2 (two) times daily. 01/12/17   Irean Hong, MD  ondansetron (ZOFRAN ODT) 4 MG disintegrating tablet Take 1 tablet (4 mg total) by mouth every 8 (eight) hours as needed for nausea or vomiting. 01/12/17   Irean Hong, MD  predniSONE  (DELTASONE) 20 MG tablet Take 2 tablets (40 mg total) by mouth daily with breakfast. 02/10/17   Sharyn Creamer, MD  triamcinolone ointment (KENALOG) 0.5 % Apply 1 application topically 2 (two) times daily. 11/16/16   Tommi Rumps, PA-C    Allergies Bee venom; Cinnamon; Contrast media [iodinated diagnostic agents]; Motrin [ibuprofen]; Nutmeg oil (myristica oil); Penicillins; Benadryl [diphenhydramine hcl]; and Topamax [topiramate] interestingly, patient lists an allergy to Benadryl however she took 2 Benadryl tablets on her own prior to arrival here.  Reports she can only take brand name Benadryl  No family history on file.  Social History Social History   Tobacco Use  . Smoking status: Current Some Day Smoker  . Smokeless tobacco: Never Used  Substance Use Topics  . Alcohol use: No  . Drug use: No    Review of Systems Constitutional: No fever/chills Eyes: No visual changes. ENT: See HPI Cardiovascular: Denies chest pain. Respiratory: Denies shortness of breath. Gastrointestinal: No abdominal pain.  No nausea, no vomiting.  No diarrhea.  No constipation. Genitourinary: Negative for dysuria. Musculoskeletal: Negative for back pain. Skin: Slight redness of the cheeks and a small hives around the jawline which are improving Neurological: Negative for headaches, focal weakness or numbness.    ____________________________________________   PHYSICAL EXAM:  VITAL SIGNS: ED Triage Vitals  Enc Vitals Group     BP --      Pulse --  Resp --      Temp --      Temp src --      SpO2 02/10/17 2138 96 %     Weight 02/10/17 2142 (!) 310 lb (140.6 kg)     Height --      Head Circumference --      Peak Flow --      Pain Score --      Pain Loc --      Pain Edu? --      Excl. in GC? --     Constitutional: Alert and oriented. Well appearing and in no acute distress. Eyes: Conjunctivae are normal. Head: Atraumatic. Nose: No congestion/rhinnorhea. Mouth/Throat: Mucous  membranes are moist.  Oropharynx is widely patent without any edema. Neck: No stridor.  Voice is clear, no hoarseness.  No trouble swallowing or breathing detected.  Of note, the patient does report her symptoms are starting to get better. Cardiovascular: Normal rate, regular rhythm. Grossly normal heart sounds.  Good peripheral circulation. Respiratory: Normal respiratory effort.  No retractions. Lungs CTAB. Gastrointestinal: Soft and nontender. No distention. Musculoskeletal: No lower extremity tenderness nor edema. Neurologic:  Normal speech and language. No gross focal neurologic deficits are appreciated.  Skin:  Skin is warm, dry and intact.  There is some slight erythema of both cheeks, and a few small hives are found over the patient's lower mandible. Psychiatric: Mood and affect are normal. Speech and behavior are normal.  ____________________________________________   LABS (all labs ordered are listed, but only abnormal results are displayed)  Labs Reviewed - No data to display ____________________________________________  EKG   ____________________________________________  RADIOLOGY   ____________________________________________   PROCEDURES  Procedure(s) performed: None  Procedures  Critical Care performed: No  ____________________________________________   INITIAL IMPRESSION / ASSESSMENT AND PLAN / ED COURSE  Pertinent labs & imaging results that were available during my care of the patient were reviewed by me and considered in my medical decision making (see chart for details).  Presents for evaluation of possible allergic reaction.  She does not demonstrate evidence of acute anaphylaxis on arrival, and her airway is widely patent with symptoms improving after taking Benadryl.  I will give her Pepcid as well as IV steroid continue to monitor very closely, at the present time she does not appear to require any epinephrine.  She is alert, well oriented and  reporting her symptoms are starting to improve.  Clinical Course as of Feb 11 9  Mon Feb 10, 2017  2236 Patient resting comfortably, reports she is feeling much better.  No ongoing trouble breathing or swallowing.  Her mother is present at the bedside, discussed plan to observe her for additional 2 hours as she does report having a history of a rebound anaphylaxis in the past once.  She did not require any epinephrine today however.  Plan to discharge her at roughly midnight should she continued to be asymptomatic.  She will continue with over-the-counter Benadryl, will prescribe her prednisone, as well as an epinephrine pen.  We discussed very careful symptoms and return precautions as well as use of an epinephrine pen.  But she does have some financial constraints, in the interim I certainly notified her that she should call 911 right away if she begins to experience any symptoms of severe reaction again.  [place], and she is agreeable with this plan  [MQ]    Clinical Course User Index [MQ] Sharyn CreamerQuale, Mark, MD   Redness over the  face is improved, hives over the lower mandible have disappeared.  She reports she is feeling much better.  ----------------------------------------- 12:09 AM on 02/11/2017 -----------------------------------------  Patient reported that she began to have some slight itching and redness, reevaluate her and she does have a little bit of redness right around her T shirt line but no hives.  Her lungs are clear prima her oropharynx completely clear no trouble breathing no hoarse voice.  Will give a little bit of IV Benadryl (reports she is not allergic to IV Benadryl )and continue to monitor closely.  Despite observation for an additional hour.  Ongoing care assigned to Dr. Dolores FrameSung.  ____________________________________________   FINAL CLINICAL IMPRESSION(S) / ED DIAGNOSES  Final diagnoses:  Urticaria  Allergic reaction to food, initial encounter       NEW MEDICATIONS STARTED DURING THIS VISIT:  This SmartLink is deprecated. Use AVSMEDLIST instead to display the medication list for a patient.   Note:  This document was prepared using Dragon voice recognition software and may include unintentional dictation errors.     Sharyn CreamerQuale, Mark, MD 02/11/17 Burna Mortimer0010

## 2017-02-11 NOTE — ED Provider Notes (Signed)
-----------------------------------------   1:43 AM on 02/11/2017 -----------------------------------------  Hives completely resolved.  Patient reports feeling much better.  Strict return precautions given.  Patient verbalizes understanding and agrees with plan of care.   Irean HongSung, Marchetta J, MD 02/11/17 [phone]

## 2017-02-11 NOTE — ED Notes (Signed)
Pt discharged to home.  Family member driving.  Discharge instructions reviewed.  Verbalized understanding.  No questions or concerns at this time.  Teach back verified.  Pt in NAD.  No items left in ED.   

## 2017-04-13 ENCOUNTER — Emergency Department: Payer: Medicaid Other

## 2017-04-13 ENCOUNTER — Encounter: Payer: Self-pay | Admitting: Emergency Medicine

## 2017-04-13 ENCOUNTER — Other Ambulatory Visit: Payer: Self-pay

## 2017-04-13 ENCOUNTER — Emergency Department
Admission: EM | Admit: 2017-04-13 | Discharge: 2017-04-14 | Disposition: A | Discharge status: Home / Self Care | Payer: Medicaid Other | Attending: Emergency Medicine | Admitting: Emergency Medicine

## 2017-04-13 DIAGNOSIS — R1010 Upper abdominal pain, unspecified: Secondary | ICD-10-CM | POA: Diagnosis not present

## 2017-04-13 DIAGNOSIS — J45909 Unspecified asthma, uncomplicated: Secondary | ICD-10-CM | POA: Insufficient documentation

## 2017-04-13 DIAGNOSIS — K760 Fatty (change of) liver, not elsewhere classified: Secondary | ICD-10-CM

## 2017-04-13 DIAGNOSIS — R0789 Other chest pain: Secondary | ICD-10-CM | POA: Insufficient documentation

## 2017-04-13 DIAGNOSIS — R079 Chest pain, unspecified: Secondary | ICD-10-CM | POA: Diagnosis present

## 2017-04-13 DIAGNOSIS — F17200 Nicotine dependence, unspecified, uncomplicated: Secondary | ICD-10-CM | POA: Diagnosis not present

## 2017-04-13 HISTORY — DX: Morbid (severe) obesity due to excess calories: E66.01

## 2017-04-13 HISTORY — DX: Cholecystitis, unspecified: K81.9

## 2017-04-13 LAB — HEPATIC FUNCTION PANEL
ALT: 26 U/L (ref 14–54)
AST: 31 U/L (ref 15–41)
Albumin: 3.8 g/dL (ref 3.5–5.0)
Alkaline Phosphatase: 88 U/L (ref 38–126)
BILIRUBIN DIRECT: 0.2 mg/dL (ref 0.1–0.5)
BILIRUBIN INDIRECT: 0.4 mg/dL (ref 0.3–0.9)
BILIRUBIN TOTAL: 0.6 mg/dL (ref 0.3–1.2)
Total Protein: 7.4 g/dL (ref 6.5–8.1)

## 2017-04-13 LAB — BASIC METABOLIC PANEL
Anion gap: 8 (ref 5–15)
BUN: 11 mg/dL (ref 6–20)
CHLORIDE: 102 mmol/L (ref 101–111)
CO2: 23 mmol/L (ref 22–32)
CREATININE: 0.72 mg/dL (ref 0.44–1.00)
Calcium: 8.8 mg/dL — ABNORMAL LOW (ref 8.9–10.3)
GFR calc Af Amer: >60 mL/min (ref >60)
GFR calc non Af Amer: >60 mL/min (ref >60)
GLUCOSE: 175 mg/dL — AB (ref 65–99)
Potassium: 3.9 mmol/L (ref 3.5–5.1)
Sodium: 133 mmol/L — ABNORMAL LOW (ref 135–145)

## 2017-04-13 LAB — CBC
HCT: 40.5 % (ref 35.0–47.0)
Hemoglobin: 13.8 g/dL (ref 12.0–16.0)
MCH: 27.2 pg (ref 26.0–34.0)
MCHC: 34.1 g/dL (ref 32.0–36.0)
MCV: 80 fL (ref 80.0–100.0)
PLATELETS: 317 10*3/uL (ref 150–440)
RBC: 5.06 MIL/uL (ref 3.80–5.20)
RDW: 14.6 % — ABNORMAL HIGH (ref 11.5–14.5)
WBC: 17.6 10*3/uL — ABNORMAL HIGH (ref 3.6–11.0)

## 2017-04-13 LAB — TROPONIN I: Troponin I: <0.03 ng/mL (ref <0.03)

## 2017-04-13 LAB — LIPASE, BLOOD: Lipase: 30 U/L (ref 11–51)

## 2017-04-13 NOTE — ED Triage Notes (Signed)
Cp x less than one hour. Was on the computer at the time. Mid chest radiating to rt shoulder with hx of gall bladder problems

## 2017-04-13 NOTE — ED Provider Notes (Signed)
[facility] Emergency Department Provider Note  ____________________________________________   First MD Initiated Contact with Patient 04/13/17 2257     (approximate)  I have reviewed the triage vital signs and the nursing notes.   HISTORY  Chief Complaint Chest Pain    HPI Sabrina Blevins is a 22 y.o. female with frequent emergency department visits typically for allergic reactions who presents for evaluation of acute onset mid substernal chest pain radiating to her right shoulder.  She states that she had something to eat, some rice, about 15 minutes prior to the onset of the pain.  She describes it as sharp, stabbing, and aching.  It will "hit me really hard" and then go away but then come back "even harder".  Nothing in particular makes it better or worse.  She describes it as severe.  She has felt nauseated all day but has not had any vomiting, abdominal pain, nor diarrhea.  Reports that she has had similar discomfort in the past multiple years ago when she was told she had a problem with her gallbladder, but at the time she and her mother report that she was told she was too young to have surgery, and they do not know exactly what the issue was at that time.  She has no history of blood clots in her legs nor her lungs.  She has had no recent surgeries, immobilizations, nor recent travel.  No exogenous estrogen use.  No known CAD or heart issues.   Past Medical History:  Diagnosis Date  . Airborne allergy, current reaction   . Asthma   . Bipolar disorder (HCC)   . Cholecystitis   . Morbid obesity (HCC)   . Seizures (HCC)     There are no active problems to display for this patient.   Past Surgical History:  Procedure Laterality Date  . ADENOIDECTOMY    . TONSILLECTOMY      Prior to Admission medications   Not on File    Allergies Bee venom; Cinnamon; Contrast media [iodinated diagnostic agents]; Motrin [ibuprofen]; Nutmeg oil (myristica oil);  Penicillins; Benadryl [diphenhydramine hcl]; and Topamax [topiramate]  History reviewed. No pertinent family history.  Social History Social History   Tobacco Use  . Smoking status: Current Some Day Smoker  . Smokeless tobacco: Never Used  Substance Use Topics  . Alcohol use: No  . Drug use: No    Review of Systems Constitutional: No fever/chills Eyes: No visual changes. ENT: No sore throat. Cardiovascular: Substernal chest pain as described above with pain radiating to the right shoulder Respiratory: Denies shortness of breath. Gastrointestinal: Upper abdominal pain.  nausea, no vomiting.  No diarrhea.  No constipation. Genitourinary: Negative for dysuria. Musculoskeletal: Negative for neck pain.  Negative for back pain. Integumentary: Negative for rash. Neurological: Negative for headaches, focal weakness or numbness.   ____________________________________________   PHYSICAL EXAM:  VITAL SIGNS: ED Triage Vitals  Enc Vitals Group     BP 04/13/17 2207 116/84     Pulse Rate 04/13/17 2207 (!) 105     Resp 04/13/17 2207 (!) 25     Temp 04/13/17 2207 98.3 F (36.8 C)     Temp Source 04/13/17 2207 Oral     SpO2 04/13/17 2207 97 %     Weight 04/13/17 2216 136.1 kg (300 lb)     Height 04/13/17 2216 1.727 m (5\' 8" )     Head Circumference --      Peak Flow --  Pain Score 04/13/17 2216 5     Pain Loc --      Pain Edu? --      Excl. in GC? --     Constitutional: Alert and oriented. Well appearing and in no acute distress. Eyes: Conjunctivae are normal.  Head: Atraumatic. Nose: No congestion/rhinnorhea. Mouth/Throat: Mucous membranes are moist. Neck: No stridor.  No meningeal signs.   Cardiovascular: Normal rate, regular rhythm. Good peripheral circulation. Grossly normal heart sounds. Respiratory: Normal respiratory effort.  No retractions. Lungs CTAB. Gastrointestinal: Morbid obesity.  Soft tenderness to palpation of her right upper quadrant but localization is  difficult secondary to her body habitus, equivocal Murphy sign.  No lower abdominal tenderness to palpation, no rebound and no guarding. no distention.  Musculoskeletal: No lower extremity tenderness nor edema. No gross deformities of extremities. Neurologic:  Normal speech and language. No gross focal neurologic deficits are appreciated.  Skin:  Skin is warm, dry and intact. No rash noted. Psychiatric: Mood and affect are normal. Speech and behavior are normal.  ____________________________________________   LABS (all labs ordered are listed, but only abnormal results are displayed)  Labs Reviewed  BASIC METABOLIC PANEL - Abnormal; Notable for the following components:      Result Value   Sodium 133 (*)    Glucose, Bld 175 (*)    Calcium 8.8 (*)    All other components within normal limits  CBC - Abnormal; Notable for the following components:   WBC 17.6 (*)    RDW 14.6 (*)    All other components within normal limits  TROPONIN I  LIPASE, BLOOD  HEPATIC FUNCTION PANEL  POC URINE PREG, ED   ____________________________________________  EKG  ED ECG REPORT I, Loleta Roseory Jahmai Finelli, the attending physician, personally viewed and interpreted this ECG.  Date: 04/13/2017 EKG Time: 22: 14 Rate: 103 Rhythm: Borderline sinus tachycardia QRS Axis: normal Intervals: normal ST/T Wave abnormalities: normal Narrative Interpretation: no evidence of acute ischemia  ____________________________________________  RADIOLOGY I, Loleta Roseory Camree Wigington, personally viewed and evaluated these images (plain radiographs) as part of my medical decision making, as well as reviewing the written report by the radiologist.  ED MD interpretation: No acute abnormalities visualized by me on chest x-ray.  Official radiologist report concurs.  Official radiology report(s): Dg Chest 2 View  Result Date: 04/13/2017 CLINICAL DATA:  22 year old female with chest pain. EXAM: CHEST  2 VIEW COMPARISON:  None. FINDINGS: The  heart size and mediastinal contours are within normal limits. Both lungs are clear. The visualized skeletal structures are unremarkable. IMPRESSION: No active cardiopulmonary disease. Electronically Signed   By: Elgie CollardArash  Radparvar M.D.   On: 04/13/2017 22:55   [place] Abdomen Limited Ruq  Result Date: 04/14/2017 CLINICAL DATA:  Chest pain EXAM: ULTRASOUND ABDOMEN LIMITED RIGHT UPPER QUADRANT COMPARISON:  01/12/2017 FINDINGS: Gallbladder: No gallstones or wall thickening visualized. No sonographic Murphy sign noted by sonographer. Common bile duct: Diameter: Normal caliber, 3 mm Liver: Increased echotexture compatible with fatty infiltration. No focal abnormality or biliary ductal dilatation. Portal vein is patent on color Doppler imaging with normal direction of blood flow towards the liver. IMPRESSION: Fatty infiltration of the liver. Electronically Signed   By: Charlett NoseKevin  Dover M.D.   On: 04/14/2017 00:48    ____________________________________________   PROCEDURES  Critical Care performed: No   Procedure(s) performed:   Procedures   ____________________________________________   INITIAL IMPRESSION / ASSESSMENT AND PLAN / ED COURSE  As part of my medical decision making, I reviewed the following data  within the electronic MEDICAL RECORD NUMBER History obtained from family, Nursing notes reviewed and incorporated, Labs reviewed , EKG interpreted , Old chart reviewed and Notes from prior ED visits    Differential diagnosis includes, but is not limited to, biliary disease (biliary colic, acute cholecystitis, cholangitis, choledocholithiasis, etc), intrathoracic causes for epigastric abdominal pain including ACS, PE, gastritis, duodenitis, pancreatitis, small bowel or large bowel obstruction, abdominal aortic aneurysm, hernia, and gastritis.  The patient's labs are still being processed but her lab work is notable for a mild leukocytosis.  Her vital signs are normal and she is afebrile.  She does have  some upper abdominal tenderness palpation and biliary colic is much more likely in this patient than ACS, PE, or most other acute or life-threatening causes of her chest/upper abdominal pain.  I am afraid that ultrasound evaluation will be difficult based on body habitus but we will try.  It may be that a CT scan is necessary to evaluate the gallbladder but if the ultrasound technologist is able to visualize the gallbladder that may provide all the information we need. Patient is PERC negative and low risk for ACS based on HEART score.  Discussed plan with patient and mother, they agree with plan.   Clinical Course as of Apr 15 111  Wynelle Link Apr 13, 2017  2307 WBC: (!) 17.6 [CF]  Mon Apr 14, 2017  0006 Normal hepatic function pain and lipase  [CF]  0109 Patient has been watching TV in no apparent discomfort.  I updated her about her reassuring ultrasound results demonstrating only fatty liver disease but no biliary disease.  I told her and her mother about her lab work including the slight leukocytosis and her mother commented that she always has an elevated white blood cell count, which I actually find reassuring.  I will give a GI cocktail but I suspect her symptoms are related to acid reflux or GI causes in general as I described above.  The patient is comfortable and also comfortable with the plan to go home and follow-up as an outpatient.  I gave my usual and customary return precautions.   [CF]    Clinical Course User Index [CF] Loleta Rose, MD    ____________________________________________  FINAL CLINICAL IMPRESSION(S) / ED DIAGNOSES  Final diagnoses:  Atypical chest pain  Upper abdominal pain  Fatty liver disease, nonalcoholic     MEDICATIONS GIVEN DURING THIS VISIT:  Medications  gi cocktail (Maalox,Lidocaine,Donnatal) (not administered)     ED Discharge Orders    None       Note:  This document was prepared using Dragon voice recognition software and may include  unintentional dictation errors.    Loleta Rose, MD 04/14/17 [phone]

## 2017-04-14 ENCOUNTER — Emergency Department: Payer: Medicaid Other

## 2017-04-14 MED ORDER — GI COCKTAIL ~~LOC~~: 30.0000 mL | Freq: Once | ORAL | Status: DC

## 2017-04-14 NOTE — Discharge Instructions (Signed)
You have been seen in the Emergency Department (ED) for chest/abdominal pain.  Your evaluation did not identify a clear cause of your symptoms but was generally reassuring.  Please follow up as instructed above regarding today?s emergent visit and the symptoms that are bothering you.  Return to the ED if your abdominal pain worsens or fails to improve, you develop bloody vomiting, bloody diarrhea, you are unable to tolerate fluids due to vomiting, fever greater than 101, or other symptoms that concern you.

## 2017-06-02 ENCOUNTER — Other Ambulatory Visit: Payer: Self-pay

## 2017-06-02 ENCOUNTER — Emergency Department
Admission: EM | Admit: 2017-06-02 | Discharge: 2017-06-02 | Disposition: A | Discharge status: Other Acute Inpatient Hospital | Payer: Medicaid Other | Attending: Emergency Medicine | Admitting: Emergency Medicine

## 2017-06-02 ENCOUNTER — Inpatient Hospital Stay
Admission: AD | Admit: 2017-06-02 | Discharge: 2017-06-06 | DRG: 885 | Disposition: A | Discharge status: Home / Self Care | Payer: Medicaid Other | Source: Intra-hospital | Attending: Psychiatry | Admitting: Psychiatry

## 2017-06-02 ENCOUNTER — Encounter: Payer: Self-pay | Admitting: Emergency Medicine

## 2017-06-02 DIAGNOSIS — R45851 Suicidal ideations: Secondary | ICD-10-CM

## 2017-06-02 DIAGNOSIS — J45909 Unspecified asthma, uncomplicated: Secondary | ICD-10-CM | POA: Diagnosis present

## 2017-06-02 DIAGNOSIS — Z886 Allergy status to analgesic agent status: Secondary | ICD-10-CM

## 2017-06-02 DIAGNOSIS — G4700 Insomnia, unspecified: Secondary | ICD-10-CM | POA: Diagnosis present

## 2017-06-02 DIAGNOSIS — Z62810 Personal history of physical and sexual abuse in childhood: Secondary | ICD-10-CM | POA: Diagnosis present

## 2017-06-02 DIAGNOSIS — F332 Major depressive disorder, recurrent severe without psychotic features: Secondary | ICD-10-CM | POA: Diagnosis not present

## 2017-06-02 DIAGNOSIS — Z91018 Allergy to other foods: Secondary | ICD-10-CM | POA: Diagnosis not present

## 2017-06-02 DIAGNOSIS — R569 Unspecified convulsions: Secondary | ICD-10-CM | POA: Diagnosis present

## 2017-06-02 DIAGNOSIS — F339 Major depressive disorder, recurrent, unspecified: Principal | ICD-10-CM

## 2017-06-02 DIAGNOSIS — Z888 Allergy status to other drugs, medicaments and biological substances status: Secondary | ICD-10-CM | POA: Diagnosis not present

## 2017-06-02 DIAGNOSIS — Z720 Tobacco use: Secondary | ICD-10-CM | POA: Diagnosis not present

## 2017-06-02 DIAGNOSIS — Z880 Allergy status to penicillin: Secondary | ICD-10-CM

## 2017-06-02 DIAGNOSIS — Z915 Personal history of self-harm: Secondary | ICD-10-CM | POA: Diagnosis not present

## 2017-06-02 DIAGNOSIS — Z811 Family history of alcohol abuse and dependence: Secondary | ICD-10-CM

## 2017-06-02 DIAGNOSIS — F410 Panic disorder [episodic paroxysmal anxiety] without agoraphobia: Secondary | ICD-10-CM | POA: Diagnosis present

## 2017-06-02 DIAGNOSIS — F909 Attention-deficit hyperactivity disorder, unspecified type: Secondary | ICD-10-CM | POA: Diagnosis present

## 2017-06-02 DIAGNOSIS — Z91030 Bee allergy status: Secondary | ICD-10-CM | POA: Diagnosis not present

## 2017-06-02 DIAGNOSIS — F4310 Post-traumatic stress disorder, unspecified: Secondary | ICD-10-CM | POA: Diagnosis present

## 2017-06-02 DIAGNOSIS — Z91041 Radiographic dye allergy status: Secondary | ICD-10-CM

## 2017-06-02 DIAGNOSIS — F329 Major depressive disorder, single episode, unspecified: Secondary | ICD-10-CM | POA: Diagnosis present

## 2017-06-02 DIAGNOSIS — F4010 Social phobia, unspecified: Secondary | ICD-10-CM | POA: Diagnosis present

## 2017-06-02 DIAGNOSIS — Z818 Family history of other mental and behavioral disorders: Secondary | ICD-10-CM | POA: Diagnosis not present

## 2017-06-02 DIAGNOSIS — Z91411 Personal history of adult psychological abuse: Secondary | ICD-10-CM | POA: Diagnosis not present

## 2017-06-02 DIAGNOSIS — Z62811 Personal history of psychological abuse in childhood: Secondary | ICD-10-CM | POA: Diagnosis present

## 2017-06-02 DIAGNOSIS — Z5181 Encounter for therapeutic drug level monitoring: Secondary | ICD-10-CM | POA: Diagnosis not present

## 2017-06-02 LAB — URINALYSIS, COMPLETE (UACMP) WITH MICROSCOPIC
Bilirubin Urine: NEGATIVE
Glucose, UA: NEGATIVE mg/dL
Hgb urine dipstick: NEGATIVE
KETONES UR: NEGATIVE mg/dL
LEUKOCYTES UA: NEGATIVE
Nitrite: NEGATIVE
PH: 5 (ref 5.0–8.0)
PROTEIN: NEGATIVE mg/dL
Specific Gravity, Urine: 1.021 (ref 1.005–1.030)

## 2017-06-02 LAB — COMPREHENSIVE METABOLIC PANEL
ALK PHOS: 103 U/L (ref 38–126)
ALT: 36 U/L (ref 14–54)
AST: 35 U/L (ref 15–41)
Albumin: 4 g/dL (ref 3.5–5.0)
Anion gap: 8 (ref 5–15)
BILIRUBIN TOTAL: 0.4 mg/dL (ref 0.3–1.2)
BUN: 9 mg/dL (ref 6–20)
CALCIUM: 9.2 mg/dL (ref 8.9–10.3)
CO2: 25 mmol/L (ref 22–32)
Chloride: 105 mmol/L (ref 101–111)
Creatinine, Ser: 0.66 mg/dL (ref 0.44–1.00)
GFR calc Af Amer: >60 mL/min (ref >60)
GFR calc non Af Amer: >60 mL/min (ref >60)
GLUCOSE: 203 mg/dL — AB (ref 65–99)
Potassium: 3.6 mmol/L (ref 3.5–5.1)
SODIUM: 138 mmol/L (ref 135–145)
Total Protein: 7.7 g/dL (ref 6.5–8.1)

## 2017-06-02 LAB — URINE DRUG SCREEN, QUALITATIVE (ARMC ONLY)
Amphetamines, Ur Screen: NOT DETECTED
BARBITURATES, UR SCREEN: NOT DETECTED
Benzodiazepine, Ur Scrn: NOT DETECTED
Cannabinoid 50 Ng, Ur ~~LOC~~: NOT DETECTED
Cocaine Metabolite,Ur ~~LOC~~: NOT DETECTED
MDMA (Ecstasy)Ur Screen: NOT DETECTED
METHADONE SCREEN, URINE: NOT DETECTED
OPIATE, UR SCREEN: NOT DETECTED
PHENCYCLIDINE (PCP) UR S: NOT DETECTED
Tricyclic, Ur Screen: NOT DETECTED

## 2017-06-02 LAB — CBC
HEMATOCRIT: 41.7 % (ref 35.0–47.0)
HEMOGLOBIN: 13.8 g/dL (ref 12.0–16.0)
MCH: 27 pg (ref 26.0–34.0)
MCHC: 33.2 g/dL (ref 32.0–36.0)
MCV: 81.3 fL (ref 80.0–100.0)
Platelets: 330 10*3/uL (ref 150–440)
RBC: 5.12 MIL/uL (ref 3.80–5.20)
RDW: 14.1 % (ref 11.5–14.5)
WBC: 16.3 10*3/uL — ABNORMAL HIGH (ref 3.6–11.0)

## 2017-06-02 LAB — ACETAMINOPHEN LEVEL

## 2017-06-02 LAB — PREGNANCY, URINE: Preg Test, Ur: NEGATIVE

## 2017-06-02 LAB — ETHANOL: Alcohol, Ethyl (B): <10 mg/dL (ref <10)

## 2017-06-02 LAB — SALICYLATE LEVEL: Salicylate Lvl: <7 mg/dL (ref 2.8–30.0)

## 2017-06-02 MED ORDER — ACETAMINOPHEN 500 MG PO TABS
1000.0000 mg | ORAL_TABLET | Freq: Once | ORAL | Status: AC
  Administered 2017-06-02: 1000 mg via ORAL
  Filled 2017-06-02: qty 2

## 2017-06-02 NOTE — ED Notes (Signed)
Hourly rounding reveals patient in day room. No complaints, stable, in no acute distress. Q15 minute rounds and monitoring via [employer] to continue. 

## 2017-06-02 NOTE — Consult Note (Signed)
[place] [facility] Consult   Reason for Consult: Consult for 22 year old woman who comes to the emergency room complaining of suicidal thoughts Referring Physician: Joni Fears Patient Identification: Sabrina Blevins MRN:  [id number] Principal Diagnosis: Severe recurrent major depression without psychotic features [facility]) Diagnosis:   Patient Active Problem List   Diagnosis Date Noted  . Severe recurrent major depression without psychotic features ([place]) [F33.2] 06/02/2017  . Suicidal ideation [R45.851] 06/02/2017    Total Time spent with patient: 1 hour  Subjective:   Sabrina Blevins is a 22 y.o. female patient admitted with "I am having depression and suicidal thoughts".  HPI: Patient interviewed chart reviewed.  22 year old woman comes in voluntarily saying that for at least the last week she has been extremely depressed.  Mood feels down and negative and overwhelmed.  She has not been sleeping for several days and has not been eating well.  She has been having intrusive thoughts about killing herself.  She is not reporting any acute hallucinations.  She says a lot of the stress she is under is caused by the upcoming anniversary of a traumatic event a year ago in which she "lost 7 members of our family".  I did not press her for details on this but looking at the chart it sounds like she has been through a lot of stress and relocation and the death of her father in the past year.  Medical history: Patient is overweight and has possibly a history of asthma but most notably seems to have profound allergies to several things most notably cinnamon which she has had a life threatening anaphylaxis 2.  Social history: Lives with her mother.  Not currently working.  Little activity during the day.  Substance abuse history: Denies alcohol or drug use saying the only time she is ever used marijuana and it made her psychotic.  Past Psychiatric History: One previous psychiatric hospitalization in [place]  and one previous suicide attempt both in 2017.  She says she has been prescribed medications in the past including Topamax but it caused some kind of profound side effects.  She is not currently on any medication.  She has gone to [facility] but they referred her over to here.  Risk to Self: Is patient at risk for suicide?: Yes Risk to Others:   Prior Inpatient Therapy:   Prior Outpatient Therapy:    Past Medical History:  Past Medical History:  Diagnosis Date  . Airborne allergy, current reaction   . Asthma   . Bipolar disorder ([place])   . Cholecystitis   . Morbid obesity ([place])   . Seizures ([place])     Past Surgical History:  Procedure Laterality Date  . ADENOIDECTOMY    . TONSILLECTOMY     Family History: No family history on file. Family Psychiatric  History: Family history of bipolar disorder Social History:  Social History   Substance and Sexual Activity  Alcohol Use No     Social History   Substance and Sexual Activity  Drug Use No    Social History   Socioeconomic History  . Marital status: Single    Spouse name: Not on file  . Number of children: Not on file  . Years of education: Not on file  . Highest education level: Not on file  Occupational History  . Not on file  Social Needs  . Financial resource strain: Not on file  . Food insecurity:    Worry: Not on file    Inability: Not on file  .  Transportation needs:    Medical: Not on file    Non-medical: Not on file  Tobacco Use  . Smoking status: Current Some Day Smoker  . Smokeless tobacco: Never Used  Substance and Sexual Activity  . Alcohol use: No  . Drug use: No  . Sexual activity: Not on file  Lifestyle  . Physical activity:    Days per week: Not on file    Minutes per session: Not on file  . Stress: Not on file  Relationships  . Social connections:    Talks on phone: Not on file    Gets together: Not on file    Attends religious service: Not on file    Active member of club or organization:  Not on file    Attends meetings of clubs or organizations: Not on file    Relationship status: Not on file  Other Topics Concern  . Not on file  Social History Narrative  . Not on file   Additional Social History:    Allergies:   Allergies  Allergen Reactions  . Bee Venom Anaphylaxis  . Cinnamon Anaphylaxis  . Contrast Media [Iodinated Diagnostic Agents] Anaphylaxis  . Motrin [Ibuprofen] Anaphylaxis  . Nutmeg Oil (Myristica Oil) Anaphylaxis  . Penicillins Anaphylaxis    Has patient had a PCN reaction causing immediate rash, facial/tongue/throat swelling, SOB or lightheadedness with hypotension: Yes Has patient had a PCN reaction causing severe rash involving mucus membranes or skin necrosis: No Has patient had a PCN reaction that required hospitalization: Unknown Has patient had a PCN reaction occurring within the last 10 years: Unknown If all of the above answers are "NO", then may proceed with Cephalosporin use.   . Benadryl [Diphenhydramine Hcl] Swelling    Pt states reaction is to GENERIC formula only  . Topamax [Topiramate] Other (See Comments)    Drops BP per mother    Labs:  Results for orders placed or performed during the hospital encounter of 06/02/17 (from the past 48 hour(s))  Comprehensive metabolic panel     Status: Abnormal   Collection Time: 06/02/17  4:30 PM  Result Value Ref Range   Sodium 138 135 - 145 mmol/L   Potassium 3.6 3.5 - 5.1 mmol/L   Chloride 105 101 - 111 mmol/L   CO2 25 22 - 32 mmol/L   Glucose, Bld 203 (H) 65 - 99 mg/dL   BUN 9 6 - 20 mg/dL   Creatinine, Ser 0.66 0.44 - 1.00 mg/dL   Calcium 9.2 8.9 - 10.3 mg/dL   Total Protein 7.7 6.5 - 8.1 g/dL   Albumin 4.0 3.5 - 5.0 g/dL   AST 35 15 - 41 U/L   ALT 36 14 - 54 U/L   Alkaline Phosphatase 103 38 - 126 U/L   Total Bilirubin 0.4 0.3 - 1.2 mg/dL   GFR calc non Af Amer >60 >60 mL/min   GFR calc Af Amer >60 >60 mL/min    Comment: (NOTE) The eGFR has been calculated using the CKD EPI  equation. This calculation has not been validated in all clinical situations. eGFR's persistently <60 mL/min signify possible Chronic Kidney Disease.    Anion gap 8 5 - 15    Comment: Performed at [facility], [address] [place], [place] [address]  Ethanol     Status: None   Collection Time: 06/02/17  4:30 PM  Result Value Ref Range   Alcohol, Ethyl (B) <10 <10 mg/dL    Comment:  LOWEST DETECTABLE LIMIT FOR SERUM ALCOHOL IS 10 mg/dL FOR MEDICAL PURPOSES ONLY Performed at [facility], [address] [place], [place] [address]   Salicylate level     Status: None   Collection Time: 06/02/17  4:30 PM  Result Value Ref Range   Salicylate Lvl <5.4 2.8 - 30.0 mg/dL    Comment: Performed at [facility], [address] [place], [place] [address]  Acetaminophen level     Status: Abnormal   Collection Time: 06/02/17  4:30 PM  Result Value Ref Range   Acetaminophen (Tylenol), Serum <10 (L) 10 - 30 ug/mL    Comment:        THERAPEUTIC CONCENTRATIONS VARY SIGNIFICANTLY. A RANGE OF 10-30 ug/mL MAY BE AN EFFECTIVE CONCENTRATION FOR MANY PATIENTS. HOWEVER, SOME ARE BEST TREATED AT CONCENTRATIONS OUTSIDE THIS RANGE. ACETAMINOPHEN CONCENTRATIONS >150 ug/mL AT 4 HOURS AFTER INGESTION AND >50 ug/mL AT 12 HOURS AFTER INGESTION ARE OFTEN ASSOCIATED WITH TOXIC REACTIONS. Performed at [facility], [address] [place], [place] [address]   cbc     Status: Abnormal   Collection Time: 06/02/17  4:30 PM  Result Value Ref Range   WBC 16.3 (H) 3.6 - 11.0 K/uL   RBC 5.12 3.80 - 5.20 MIL/uL   Hemoglobin 13.8 12.0 - 16.0 g/dL   HCT 41.7 35.0 - 47.0 %   MCV 81.3 80.0 - 100.0 fL   MCH 27.0 26.0 - 34.0 pg   MCHC 33.2 32.0 - 36.0 g/dL   RDW 14.1 11.5 - 14.5 %   Platelets 330 150 - 440 K/uL    Comment: Performed at [facility], [address] [place], [place] [address]  Urine Drug Screen, Qualitative     Status: None    Collection Time: 06/02/17  5:20 PM  Result Value Ref Range   Tricyclic, Ur Screen NONE DETECTED NONE DETECTED   Amphetamines, Ur Screen NONE DETECTED NONE DETECTED   MDMA (Ecstasy)Ur Screen NONE DETECTED NONE DETECTED   Cocaine Metabolite,Ur [place] NONE DETECTED NONE DETECTED   Opiate, Ur Screen NONE DETECTED NONE DETECTED   Phencyclidine (PCP) Ur S NONE DETECTED NONE DETECTED   Cannabinoid 50 Ng, Ur [place] NONE DETECTED NONE DETECTED   Barbiturates, Ur Screen NONE DETECTED NONE DETECTED   Benzodiazepine, Ur Scrn NONE DETECTED NONE DETECTED   Methadone Scn, Ur NONE DETECTED NONE DETECTED    Comment: (NOTE) Tricyclics + metabolites, urine    Cutoff 1000 ng/mL Amphetamines + metabolites, urine  Cutoff 1000 ng/mL MDMA (Ecstasy), urine              Cutoff 500 ng/mL Cocaine Metabolite, urine          Cutoff 300 ng/mL Opiate + metabolites, urine        Cutoff 300 ng/mL Phencyclidine (PCP), urine         Cutoff 25 ng/mL Cannabinoid, urine                 Cutoff 50 ng/mL Barbiturates + metabolites, urine  Cutoff 200 ng/mL Benzodiazepine, urine              Cutoff 200 ng/mL Methadone, urine                   Cutoff 300 ng/mL The urine drug screen provides only a preliminary, unconfirmed analytical test result and should not be used for non-medical purposes. Clinical consideration and professional judgment should be applied to any positive drug screen result due to possible  interfering substances. A more specific alternate chemical method must be used in order to obtain a confirmed analytical result. Gas chromatography / mass spectrometry (GC/MS) is the preferred confirmat ory method. Performed at [facility], [address] [place], [place] [address]   Urinalysis, Complete w Microscopic     Status: Abnormal   Collection Time: 06/02/17  5:20 PM  Result Value Ref Range   Color, Urine YELLOW (A) YELLOW   APPearance HAZY (A) CLEAR   Specific Gravity, Urine 1.021 1.005 - 1.030   pH 5.0  5.0 - 8.0   Glucose, UA NEGATIVE NEGATIVE mg/dL   Hgb urine dipstick NEGATIVE NEGATIVE   Bilirubin Urine NEGATIVE NEGATIVE   Ketones, ur NEGATIVE NEGATIVE mg/dL   Protein, ur NEGATIVE NEGATIVE mg/dL   Nitrite NEGATIVE NEGATIVE   Leukocytes, UA NEGATIVE NEGATIVE   RBC / HPF 0-5 0 - 5 RBC/hpf   WBC, UA 6-30 0 - 5 WBC/hpf   Bacteria, UA RARE (A) NONE SEEN   Squamous Epithelial / LPF 6-30 (A) NONE SEEN    Comment: Performed at [facility], [address] [place], [place] [address]    No current facility-administered medications for this encounter.    No current outpatient medications on file.    Musculoskeletal: Strength & Muscle Tone: within normal limits Gait & Station: normal Patient leans: N/A  Psychiatric Specialty Exam: Physical Exam  Nursing note and vitals reviewed. Constitutional: She appears well-developed and well-nourished.  HENT:  Head: Normocephalic and atraumatic.  Eyes: Pupils are equal, round, and reactive to light. Conjunctivae are normal.  Neck: Normal range of motion.  Cardiovascular: Regular rhythm and normal heart sounds.  Respiratory: Effort normal. No respiratory distress.  GI: Soft.  Musculoskeletal: Normal range of motion.  Neurological: She is alert.  Skin: Skin is warm and dry.  Psychiatric: Her mood appears anxious. Her speech is delayed. She is slowed and withdrawn. Cognition and memory are impaired. She expresses impulsivity. She exhibits a depressed mood. She expresses suicidal ideation. She expresses no suicidal plans.    Review of Systems  Constitutional: Negative.   HENT: Negative.   Eyes: Negative.   Respiratory: Negative.   Cardiovascular: Negative.   Gastrointestinal: Negative.   Musculoskeletal: Negative.   Skin: Negative.   Neurological: Negative.   Psychiatric/Behavioral: Positive for depression and suicidal ideas. Negative for hallucinations, memory loss and substance abuse. The patient is nervous/anxious and has  insomnia.     Blood pressure (!) 136/103, pulse (!) 112, temperature 97.8 F (36.6 C), temperature source Oral, resp. rate 16, height 5' 8"  (1.727 m), weight (!) 167.8 kg (370 lb), last menstrual period 05/15/2017, SpO2 98 %.Body mass index is 56.26 kg/m.  General Appearance: Casual  Eye Contact:  Good  Speech:  Slow  Volume:  Decreased  Mood:  Depressed  Affect:  Congruent  Thought Process:  Goal Directed  Orientation:  Full (Time, Place, and Person)  Thought Content:  Logical  Suicidal Thoughts:  Yes.  with intent/plan  Homicidal Thoughts:  No  Memory:  Immediate;   Fair Recent;   Fair Remote;   Fair  Judgement:  Fair  Insight:  Fair  Psychomotor Activity:  Decreased  Concentration:  Concentration: Fair  Recall:  [employer] of Knowledge:  Fair  Language:  Fair  Akathisia:  No  Handed:  Right  AIMS (if indicated):     Assets:  Desire for Improvement Housing Resilience Social Support  ADL's:  Intact  Cognition:  WNL  Sleep:  Treatment Plan Summary: Plan 22 year old woman is reporting multiple symptoms of severe depression along with active suicidal ideation.  Patient meets criteria for admission to the hospital.  Case reviewed with TTS and emergency room physician.  Patient agreeable to plan.  Because of her history of bad reactions to multiple medicines I am not going to get aggressive about starting anything right now.  Full set of labs to be done.  Disposition: Recommend psychiatric Inpatient admission when medically cleared. Supportive therapy provided about ongoing stressors.  Alethia Berthold, MD 06/02/2017 7:14 PM

## 2017-06-02 NOTE — ED Triage Notes (Addendum)
C/O being "extrememly suicidal and depressed".  [facility].  SI.  Denies HI.  Has had suicide attempts in the past, last in 2017.  Denies current plan.  Patient is voluntary.

## 2017-06-02 NOTE — BH Assessment (Signed)
Assessment Note  Sabrina HatchetJade Blevins is an 22 y.o. female. Sabrina Blevins arrive to the ED by way of personal transportation by her mother.  She reports that she is "depressed and suicidal thoughts". She reports that this has been occurring for "a couple days". She states that she has not slept in 3 days.  She reports that she is "barely eating, not getting any sleep, my mind is constantly racing, I tend to lock myself in the bedroom and stay to myself, and I have been showering more".  She states that she is new to [place] and has been here approximately 3 months.  She reports that she has "massive social anxiety".  She denied having auditory or visual hallucinations. She denied homicidal ideation or intent.  She reports that she is facing financial stressors and family stressors, in addition she report that she has been getting a lot of cyberbullying, and the internet was one of her basic coping skills. She reports this time is the anniversary of her miscarriage and her father's death.  She reports that she lost 7 people in the same month.  Diagnosis: Depression Past Medical History:  Past Medical History:  Diagnosis Date  . Airborne allergy, current reaction   . Asthma   . Bipolar disorder ([facility])   . Cholecystitis   . Morbid obesity ([facility])   . Seizures ([facility])     Past Surgical History:  Procedure Laterality Date  . ADENOIDECTOMY    . TONSILLECTOMY      Family History: No family history on file.  Social History:  reports that she has been smoking.  She has never used smokeless tobacco. She reports that she does not drink alcohol or use drugs.  Additional Social History:  Alcohol / Drug Use History of alcohol / drug use?: No history of alcohol / drug abuse  CIWA: CIWA-Ar BP: (!) 136/103 Pulse Rate: (!) 112 COWS:    Allergies:  Allergies  Allergen Reactions  . Bee Venom Anaphylaxis  . Cinnamon Anaphylaxis  . Contrast Media [Iodinated Diagnostic Agents] Anaphylaxis  . Motrin [Ibuprofen]  Anaphylaxis  . Nutmeg Oil (Myristica Oil) Anaphylaxis  . Penicillins Anaphylaxis    Has patient had a PCN reaction causing immediate rash, facial/tongue/throat swelling, SOB or lightheadedness with hypotension: Yes Has patient had a PCN reaction causing severe rash involving mucus membranes or skin necrosis: No Has patient had a PCN reaction that required hospitalization: Unknown Has patient had a PCN reaction occurring within the last 10 years: Unknown If all of the above answers are "NO", then may proceed with Cephalosporin use.   . Benadryl [Diphenhydramine Hcl] Swelling    Pt states reaction is to GENERIC formula only  . Topamax [Topiramate] Other (See Comments)    Drops BP per mother    Home Medications:  (Not in a hospital admission)  OB/GYN Status:  Patient's last menstrual period was 05/15/2017.  General Assessment Data [facility] T[facility] Assessment: In system Is this a Tele or Face-to-Face Assessment?: Face-to-Face Is this an Initial Assessment or a Re-assessment for this encounter?: Initial Assessment Marital status: Single Maiden name: Stephen Is patient pregnant?: No Pregnancy Status: No Living Arrangements: Parent Can pt return to current living arrangement?: Yes Admission Status: Voluntary Is patient capable of signing voluntary admission?: Yes Referral Source: Self/Family/Friend Insurance type: Medicaid  Medical Screening Exam [facility] Walk-in ONLY) Medical Exam completed: Yes  Crisis Care Plan Living Arrangements: Parent Legal Guardian: Other:(Self) Name of Psychiatrist: None Name of Therapist: None  Education Status Is patient currently in school?: No Is the patient employed, unemployed or receiving disability?: Unemployed  Risk to self with the past 6 months Suicidal Ideation: Yes-Currently Present Has patient been a risk to self within the past 6 months prior to admission? : Yes Suicidal Intent: Yes-Currently Present Has patient had  any suicidal intent within the past 6 months prior to admission? : Yes Is patient at risk for suicide?: No Suicidal Plan?: Yes-Currently Present Has patient had any suicidal plan within the past 6 months prior to admission? : Yes Specify Current Suicidal Plan: Overdosing Access to Means: No What has been your use of drugs/alcohol within the last 12 months?: denied use Previous Attempts/Gestures: Yes How many times?: 3 Other Self Harm Risks: history of cutting Triggers for Past Attempts: None known Intentional Self Injurious Behavior: Cutting(in the past) Family Suicide History: Yes(Aunt) Recent stressful life event(s): Conflict (Comment), Financial Problems, Other (Comment)(Cyberbullying,  family stressors) Persecutory voices/beliefs?: No Depression: Yes Depression Symptoms: Despondent, Feeling worthless/self pity Substance abuse history and/or treatment for substance abuse?: No Suicide prevention information given to non-admitted patients: Not applicable  Risk to Others within the past 6 months Homicidal Ideation: No Does patient have any lifetime risk of violence toward others beyond the six months prior to admission? : No Thoughts of Harm to Others: No Current Homicidal Intent: No Current Homicidal Plan: No Access to Homicidal Means: No Identified Victim: None identified History of harm to others?: No Assessment of Violence: None Noted Violent Behavior Description: None reported Does patient have access to weapons?: No Criminal Charges Pending?: No Does patient have a court date: No Is patient on probation?: No  Psychosis Hallucinations: None noted Delusions: None noted  Mental Status Report Appearance/Hygiene: In scrubs Eye Contact: Fair Motor Activity: Unremarkable Speech: Logical/coherent Level of Consciousness: Alert Mood: Depressed Affect: Flat Anxiety Level: Minimal Thought Processes: Coherent Judgement: Partial Orientation: Person, Place,  Situation Obsessive Compulsive Thoughts/Behaviors: None  Cognitive Functioning Concentration: Poor Memory: Recent Intact Is patient IDD: No Is patient DD?: No Insight: Fair Impulse Control: Fair Appetite: Poor Have you had any weight changes? : No Change Sleep: Decreased Vegetative Symptoms: None  ADLScreening [facility] Assessment Services) Patient's cognitive ability adequate to safely complete daily activities?: Yes Patient able to express need for assistance with ADLs?: Yes Independently performs ADLs?: Yes (appropriate for developmental age)  Prior Inpatient Therapy Prior Inpatient Therapy: Yes Prior Therapy Dates: 2017 Prior Therapy Facilty/Provider(s): (in [place]) Reason for Treatment: Depression, Suicidal thoughts, prior attempt  Prior Outpatient Therapy Prior Outpatient Therapy: Yes Prior Therapy Dates: Current Prior Therapy Facilty/Provider(s): [facility] Reason for Treatment: Depression Does patient have an ACCT team?: No Does patient have Intensive In-House Services?  : No Does patient have [facility] services? : No Does patient have P4CC services?: No  ADL Screening (condition at time of admission) Patient's cognitive ability adequate to safely complete daily activities?: Yes Is the patient deaf or have difficulty hearing?: No Does the patient have difficulty seeing, even when wearing glasses/contacts?: No Does the patient have difficulty concentrating, remembering, or making decisions?: No Patient able to express need for assistance with ADLs?: Yes Does the patient have difficulty dressing or bathing?: No Independently performs ADLs?: Yes (appropriate for developmental age) Does the patient have difficulty walking or climbing stairs?: No Weakness of Legs: None Weakness of Arms/Hands: None  Home Assistive Devices/Equipment Home Assistive Devices/Equipment: None    Abuse/Neglect Assessment (Assessment to be complete while patient is alone) Abuse/Neglect Assessment  Can Be Completed: (Patient denied)  Disposition:  Disposition Initial Assessment Completed for this Encounter: Yes  On Site Evaluation by:   Reviewed with Physician:    Justice Deeds 06/02/2017 8:34 PM

## 2017-06-02 NOTE — ED Notes (Signed)
Hourly rounding reveals patient in room. No complaints, stable, in no acute distress. Q15 minute rounds and monitoring via Security Cameras to continue. 

## 2017-06-02 NOTE — ED Provider Notes (Addendum)
[facility] Emergency Department Provider Note  ____________________________________________  Time seen: Approximately 6:26 PM  I have reviewed the triage vital signs and the nursing notes.   HISTORY  Chief Complaint Suicidal    HPI Sabrina Blevins is a 22 y.o. female who complains of feeling very depressed and having suicidal thoughts. She has a history of self-harm including with ingesting foods that she knows she has an anaphylactic reaction to an with cutting. She hasn't slept in 3 days, has loss of appetite, is tearful and has a depressed mood. She was seen at [facility] where she reported her suicidal thoughts and they sent her to the ED for evaluation. Symptoms are severe and constant without aggravating or alleviating factors.      Past Medical History:  Diagnosis Date  . Airborne allergy, current reaction   . Asthma   . Bipolar disorder (HCC)   . Cholecystitis   . Morbid obesity (HCC)   . Seizures (HCC)      There are no active problems to display for this patient.    Past Surgical History:  Procedure Laterality Date  . ADENOIDECTOMY    . TONSILLECTOMY       Prior to Admission medications   Not on File     Allergies Bee venom; Cinnamon; Contrast media [iodinated diagnostic agents]; Motrin [ibuprofen]; Nutmeg oil (myristica oil); Penicillins; Benadryl [diphenhydramine hcl]; and Topamax [topiramate]   No family history on file.  Social History Social History   Tobacco Use  . Smoking status: Current Some Day Smoker  . Smokeless tobacco: Never Used  Substance Use Topics  . Alcohol use: No  . Drug use: No    Review of Systems  Constitutional:   No fever or chills.  ENT:   No sore throat. No rhinorrhea. Cardiovascular:   No chest pain or syncope. Respiratory:   No dyspnea or cough. Gastrointestinal:   Negative for abdominal pain, vomiting and diarrhea.  Musculoskeletal:   Negative for focal pain or swelling All other systems  reviewed and are negative except as documented above in ROS and HPI.  ____________________________________________   PHYSICAL EXAM:  VITAL SIGNS: ED Triage Vitals  Enc Vitals Group     BP 06/02/17 1625 (!) 136/103     Pulse Rate 06/02/17 1625 (!) 112     Resp 06/02/17 1625 16     Temp 06/02/17 1625 97.8 F (36.6 C)     Temp Source 06/02/17 1625 Oral     SpO2 06/02/17 1625 98 %     Weight 06/02/17 1623 (!) 370 lb (167.8 kg)     Height 06/02/17 1623 5\' 8"  (1.727 m)     Head Circumference --      Peak Flow --      Pain Score 06/02/17 1623 0     Pain Loc --      Pain Edu? --      Excl. in GC? --     Vital signs reviewed, nursing assessments reviewed.   Constitutional:   Alert and oriented. Well appearing and in no distress. tearful Eyes:   Conjunctivae are normal. EOMI. ENT      Head:   Normocephalic and atraumatic.      Nose:   No congestion/rhinnorhea.       Mouth/Throat:   MMM, no pharyngeal erythema. No peritonsillar mass.       Neck:   No meningismus. Full ROM. Hematological/Lymphatic/Immunilogical:   No cervical lymphadenopathy. Cardiovascular:   RRR. Symmetric bilateral radial and  DP pulses.  No murmurs.  Respiratory:   Normal respiratory effort without tachypnea/retractions. Breath sounds are clear and equal bilaterally. No wheezes/rales/rhonchi. Gastrointestinal:   Soft and nontender. Non distended. There is no CVA tenderness.  No rebound, rigidity, or guarding.  Musculoskeletal:   Normal range of motion in all extremities. No joint effusions.  No lower extremity tenderness.  No edema.no apparent injuries Neurologic:   Normal speech and language.  Motor grossly intact. No acute focal neurologic deficits are appreciated.  Skin:    Skin is warm, dry and intact. no wounds ____________________________________________    LABS (pertinent positives/negatives) (all labs ordered are listed, but only abnormal results are displayed) Labs Reviewed  COMPREHENSIVE  METABOLIC PANEL - Abnormal; Notable for the following components:      Result Value   Glucose, Bld 203 (*)    All other components within normal limits  ACETAMINOPHEN LEVEL - Abnormal; Notable for the following components:   Acetaminophen (Tylenol), Serum <10 (*)    All other components within normal limits  CBC - Abnormal; Notable for the following components:   WBC 16.3 (*)    All other components within normal limits  ETHANOL  SALICYLATE LEVEL  URINE DRUG SCREEN, QUALITATIVE ([facility] ONLY)  URINALYSIS, COMPLETE (UACMP) WITH MICROSCOPIC  POC URINE PREG, ED   ____________________________________________   EKG    ____________________________________________    RADIOLOGY  No results found.  ____________________________________________   PROCEDURES Procedures  ____________________________________________    CLINICAL IMPRESSION / ASSESSMENT AND PLAN / ED COURSE  Pertinent labs & imaging results that were available during my care of the patient were reviewed by me and considered in my medical decision making (see chart for details).      Clinical Course as of Jun 02 1824  Mon Jun 02, 2017  1743 SI, sx concerning for MDD. Will consult psych / TTS.    [PS]    Clinical Course User Index [PS] Sharman CheekStafford, Gordon Vandunk, MD    ----------------------------------------- 6:28 PM on 06/02/2017 -----------------------------------------  Discussed with psychiatry Dr. Toni Amendlapacs after his evaluation. Plans to admit for further psychiatric stabilization. Patient remains medically stable. Labs unremarkable. Suitable for transfer to the ED BHU while further psychiatric care is coordinated.   ----------------------------------------- 6:54 PM on 06/02/2017 -----------------------------------------  Urinalysis just resulted, negative. No evidence of infection. I think her triage tachycardia and elevated white blood cell count or due to her current psychological state and not indicative  of underlying infection. Patient is certainly not septic. She has no acute complaints to suggest any underlying infection.  ____________________________________________   FINAL CLINICAL IMPRESSION(S) / ED DIAGNOSES    Final diagnoses:  Suicidal ideation  Severe episode of recurrent major depressive disorder, without psychotic features [facility](HCC)     ED Discharge Orders    None      Portions of this note were generated with dragon dictation software. Dictation errors may occur despite best attempts at proofreading.    Sharman CheekStafford, Anaaya Fuster, MD 06/02/17 Izola [id number]    Sharman CheekStafford, Jenness Stemler, MD 06/02/17 [phone]

## 2017-06-02 NOTE — ED Notes (Signed)
Pt. Transferred to BHU from ED to room 5 after screening for contraband. Report to include Situation, Background, Assessment and [facility]. Pt. Oriented to unit including Q15 minute rounds as well as the security cameras for their protection. Patient is alert and oriented, warm and dry in no acute distress. Patient denies SI, HI, and AVH. Pt. Encouraged to let me know if needs arise.

## 2017-06-02 NOTE — ED Notes (Signed)
Patient in hallway beside nurse's station. Pt has had questions regarding why she is in purple scrubs, etc. Explained our goal of patient safety and assured her we are in her corner and will take good care of her, providing her with resources, etc. Pt tearful, but demonstrates acceptance.

## 2017-06-03 DIAGNOSIS — Z5181 Encounter for therapeutic drug level monitoring: Secondary | ICD-10-CM

## 2017-06-03 DIAGNOSIS — F339 Major depressive disorder, recurrent, unspecified: Principal | ICD-10-CM

## 2017-06-03 LAB — LIPID PANEL
CHOL/HDL RATIO: 4.5 ratio
Cholesterol: 181 mg/dL (ref 0–200)
HDL: 40 mg/dL — AB (ref >40)
LDL Cholesterol: 103 mg/dL — ABNORMAL HIGH (ref 0–99)
Triglycerides: 192 mg/dL — ABNORMAL HIGH (ref <150)
VLDL: 38 mg/dL (ref 0–40)

## 2017-06-03 LAB — HEMOGLOBIN A1C
Hgb A1c MFr Bld: 6 % — ABNORMAL HIGH (ref 4.8–5.6)
MEAN PLASMA GLUCOSE: 125.5 mg/dL

## 2017-06-03 LAB — TSH: TSH: 5.183 u[IU]/mL — AB (ref 0.350–4.500)

## 2017-06-03 LAB — T4, FREE: FREE T4: 1.05 ng/dL (ref 0.61–1.12)

## 2017-06-03 LAB — GLUCOSE, CAPILLARY: Glucose-Capillary: 196 mg/dL — ABNORMAL HIGH (ref 65–99)

## 2017-06-03 MED ORDER — ACETAMINOPHEN 325 MG PO TABS: 650.0000 mg | ORAL_TABLET | Freq: Four times a day (QID) | ORAL | Status: DC | PRN

## 2017-06-03 MED ORDER — TRAZODONE HCL 100 MG PO TABS
100.0000 mg | ORAL_TABLET | Freq: Every day | ORAL | Status: DC
  Administered 2017-06-03 – 2017-06-05 (×3): 100 mg via ORAL
  Filled 2017-06-03 (×3): qty 1

## 2017-06-03 MED ORDER — ALUM & MAG HYDROXIDE-SIMETH 200-200-20 MG/5ML PO SUSP: 30.0000 mL | ORAL | Status: DC | PRN

## 2017-06-03 MED ORDER — LAMOTRIGINE 25 MG PO TABS
25.0000 mg | ORAL_TABLET | Freq: Every day | ORAL | Status: DC
  Administered 2017-06-03 – 2017-06-06 (×4): 25 mg via ORAL
  Filled 2017-06-03 (×4): qty 1

## 2017-06-03 MED ORDER — MAGNESIUM HYDROXIDE 400 MG/5ML PO SUSP: 30.0000 mL | Freq: Every day | ORAL | Status: DC | PRN

## 2017-06-03 NOTE — [facility] Suicide Risk Assessment (Signed)
[facility] INPATIENT:  Family/Significant Other Suicide Prevention Education  Suicide Prevention Education:  Patient Refusal for Family/Significant Other Suicide Prevention Education: The patient Sabrina HatchetJade Milam has refused to provide written consent for family/significant other to be provided Family/Significant Other Suicide Prevention Education during admission and/or prior to discharge.  Physician notified.  Johny ShearsCassandra  Amit Leece 06/03/2017, 10:40 AM

## 2017-06-03 NOTE — [facility] Group Notes (Signed)
06/03/2017 1PM  Type of Therapy/Topic:  Group Therapy:  Feelings about Diagnosis  Participation Level:  Active   Description of Group:   This group will allow patients to explore their thoughts and feelings about diagnoses they have received. Patients will be guided to explore their level of understanding and acceptance of these diagnoses. Facilitator will encourage patients to process their thoughts and feelings about the reactions of others to their diagnosis and will guide patients in identifying ways to discuss their diagnosis with significant others in their lives. This group will be process-oriented, with patients participating in exploration of their own experiences, giving and receiving support, and processing challenge from other group members.   Therapeutic Goals: 1. Patient will demonstrate understanding of diagnosis as evidenced by identifying two or more symptoms of the disorder 2. Patient will be able to express two feelings regarding the diagnosis 3. Patient will demonstrate their ability to communicate their needs through discussion and/or role play  Summary of Patient Progress: Actively and appropriately engaged in the group. Patient was able to provide support and validation to other group members.Patient practiced active listening when interacting with the facilitator and other group members. Sabrina RubensteinJade spoke about her symptoms that she experiences such as anxiety. She also reports "my dog helps me when I feel sad or upset." Patient in still in the process of obtaining treatment goals.        Therapeutic Modalities:   Cognitive Behavioral Therapy Brief Therapy Feelings Identification    Johny ShearsCassandra  Camaron Cammack, LCSW 06/03/2017 2:00 PM

## 2017-06-03 NOTE — Progress Notes (Signed)
Recreation Therapy Notes  INPATIENT RECREATION THERAPY ASSESSMENT  Patient Details Name: Sabrina Blevins MRN: [id number] DOB: 03-01-1995 Today's Date: 06/03/2017       Information Obtained From: Patient  Able to Participate in Assessment/Interview: Yes  Patient Presentation: Responsive  Reason for Admission (Per Patient): Other (Comments)(stress (anniversaryof death of father and miscarriage))  Patient Stressors: Family, Death(Finances)  Coping Skills:   Other (Comment)(Video games, walk dog)  Leisure Interests (2+):  (Walk dog in park)  Frequency of Recreation/Participation:    Awareness of Community Resources:  Yes  Community Resources:  Library, [employer], [place]  Current Use: Yes  If no, Barriers?:    Expressed Interest in [employer] CorporationCommunity Resource Information:    [place]  Patient Main Form of Transportation: Other (Comment)(My mother drives me)  Patient Strengths:  easy going, fast learner, polite  Patient Identified Areas of Improvement:  Be more social  Patient Goal for Hospitalization:  Try to get on the best medicine for my anxiety  Current SI (including self-harm):  No  Current HI:  No  Current AVH: No  Staff Intervention Plan: Collaborate with Interdisciplinary Treatment Team, Group Attendance  Consent to Intern Participation: N/A  Sabrina Blevins 06/03/2017, 4:40 PM

## 2017-06-03 NOTE — Plan of Care (Signed)
Patient has the ability to utilize techniques to improve her thought processes. Patient verbalizes understanding of the general information that has been provided to her as well as her prescribed therapeutic regimen and has not voiced any further questions or concerns at this time. Patient has the ability to cope and has been out in the milieu as well as attending unit groups. Patient has the ability to verbalize feelings by stating to this writer her depression level is a "5/10" and her anxiety level is an "8/10" stating she feels this way because of "just being here in a new environment". Patient has the ability to identify the available resources to help assist her in meeting her health care needs, however she has not mentioned any to this Clinical research associatewriter today. Patient denies SI/HI/AVH at this time. Patient has been free from any health-related complications. Patient has worked towards keeping her clinical measurements within normal limits. Patient has maintained adequate nutrition and her risk for activity intolerance has decreased. Patient's overall comfort level has improved and her risk for impaired skin integrity has also decreased. Patient has remained free from injury thus far on the unit and remains safe on the unit at this time.
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
## 2017-06-03 NOTE — BHH Group Notes (Signed)
  06/03/2017  Time: 0900  Type of Therapy and Topic: Group Therapy: Goals Group: SMART Goals   Participation Level:  Did Not Attend   Description of Group:   The purpose of a daily goals group is to assist and guide patients in setting recovery/wellness-related goals. The objective is to set goals as they relate to the crisis in which they were admitted. Patients will be using SMART goal modalities to set measurable goals. Characteristics of realistic goals will be discussed and patients will be assisted in setting and processing how one will reach their goal. Facilitator will also assist patients in applying interventions and coping skills learned in psycho-education groups to the SMART goal and process how one will achieve defined goal.   Therapeutic Goals:  -Patients will develop and document one goal related to or their crisis in which brought them into treatment.  -Patients will be guided by LCSW using SMART goal setting modality in how to set a measurable, attainable, realistic and time sensitive goal.  -Patients will process barriers in reaching goal.  -Patients will process interventions in how to overcome and successful in reaching goal.   Patient's Goal:  Pt was invited to attend group but chose not to attend. CSW will continue to encourage pt to attend group throughout their admission.   Therapeutic Modalities:  Motivational Interviewing  Cognitive Behavioral Therapy  Crisis Intervention Model  SMART goals setting   Federico Maiorino, MSW, LCSW Clinical Social Worker 06/03/2017 10:01 AM  

## 2017-06-03 NOTE — Progress Notes (Signed)
D- Patient alert and oriented. Patient presents in a pleasant mood on assessment stating that she didn't sleep well at all last night and has sleep issues on a regular. Patient rates her depression level a "5/10" and her anxiety level a "8/10" stating that she's feeling this way because of "just being here in a new environment". Patient denies SI, HI, AVH, and pain at this time. Patient's goal for today is to "create a goal to wellness, hopefully get more sleep", in which she doesn't know exactly how she's going to accomplish this goal.  A- Scheduled medications administered to patient, per MD orders. Support and encouragement provided.  Routine safety checks conducted every 15 minutes.  Patient informed to notify staff with problems or concerns..  R- No adverse drug reactions noted. Patient contracts for safety at this time. Patient compliant with medications and treatment plan. Patient receptive, calm, and cooperative. Patient interacts well with others on the unit.  Patient remains safe at this time.
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
## 2017-06-03 NOTE — [facility] Group Notes (Signed)
[facility] Group Notes:  (Nursing/MHT/Case Management/Adjunct)  Date:  06/03/2017  Time:  11:08 PM  Type of Therapy:  Group Therapy  Participation Level:  Active  Participation Quality:  Appropriate  Affect:  Appropriate  Cognitive:  Alert  Insight:  Appropriate  Engagement in Group:  Engaged  Modes of Intervention:  Support  Summary of Progress/Problems:  Mayra NeerJackie L Maddisen Vought 06/03/2017, 11:08 PM

## 2017-06-03 NOTE — Progress Notes (Signed)
Recreation Therapy Notes  Date: 06/03/2017  Time: 9:30 am   Location: Craft Room   Behavioral response: N/A   Intervention Topic: Coping Skills   Discussion/Intervention: Patient did not attend group.   Clinical Observations/Feedback:  Patient did not attend group.   Garvis Downum LRT/CTRS        Mariame Rybolt 06/03/2017 11:44 AM 

## 2017-06-03 NOTE — Tx Team (Signed)
Initial Treatment Plan 06/03/2017 1:16 AM Lorenda HatchetJade Sprecher WUJ:[id number]    PATIENT STRESSORS: Financial difficulties Health problems Medication change or noncompliance Occupational concerns   PATIENT STRENGTHS: Average or above average intelligence Capable of independent living Motivation for treatment/growth Physical Health   PATIENT IDENTIFIED PROBLEMS: Lack of sleep    Poor eating habit    Intrusive thought of harm to self    Loss of family members.         DISCHARGE CRITERIA:  Ability to meet basic life and health needs Adequate post-discharge living arrangements Improved stabilization in mood, thinking, and/or behavior Medical problems require only outpatient monitoring Motivation to continue treatment in a less acute level of care Reduction of life-threatening or endangering symptoms to within safe limits  PRELIMINARY DISCHARGE PLAN: Attend aftercare/continuing care group Outpatient therapy Participate in family therapy Placement in alternative living arrangements  PATIENT/FAMILY INVOLVEMENT: This treatment plan has been presented to and reviewed with the patient, Lorenda HatchetJade Ferryman,   The patient have been given the opportunity to ask questions and make suggestions.  Lelan PonsAlexander  Dresden Ament, RN 06/03/2017, 1:16 AM

## 2017-06-03 NOTE — Progress Notes (Signed)
New admit in the unit with complain of lack of sleep for several days and not eating well and intrusive thought of kelling her self couples with loss of her family members,patient is feeling depressed , mood is flat, patient is over weight and may contribute to her anxiety and depression, patient is supported and encouraged .Bukola RN did the body search and skin checks  No contraband found and skin is clean, patient now denies any thought of hurting her self or others and no signs of AVH,, patient is  reminded of 15 minute   Safety rounding. Assessment is complete no distress . Patient voice no complains at this time

## 2017-06-03 NOTE — [facility] Suicide Risk Assessment (Signed)
[facility]   Nursing information obtained from:  Patient Demographic factors:  Caucasian Current Mental Status:  Self-harm behaviors Loss Factors:  Loss of significant relationship Historical Factors:  NA Risk Reduction Factors:  Sense of responsibility to family  Total Time spent with patient: 1 hour Principal Problem: Major depressive disorder, recurrent episode with mixed features (HCC) Diagnosis:   Patient Active Problem List   Diagnosis Date Noted  . Major depressive disorder, recurrent episode with mixed features (HCC) [F33.9] 06/03/2017    Priority: High  . Suicidal ideation [R45.851] 06/02/2017   Subjective Data: See H&P  Continued Clinical Symptoms:  Alcohol Use Disorder Identification Test Final Score (AUDIT): 3 The "Alcohol Use Disorders Identification Test", Guidelines for Use in Primary Care, Second Edition.  World Science writerHealth Organization [facility](WHO). Score between 0-7:  no or low risk or alcohol related problems. Score between 8-15:  moderate risk of alcohol related problems. Score between 16-19:  high risk of alcohol related problems. Score 20 or above:  warrants further diagnostic evaluation for alcohol dependence and treatment.   CLINICAL FACTORS:   Depression:   Hopelessness Impulsivity Insomnia Personality Disorders:   Cluster B      COGNITIVE FEATURES THAT CONTRIBUTE TO RISK:  None    SUICIDE RISK:   Moderate:  Frequent suicidal ideation with limited intensity, and duration, some specificity in terms of plans, no associated intent, good self-control, limited dysphoria/symptomatology, some risk factors present, and identifiable protective factors, including available and accessible social support.  PLAN OF CARE: See H&P  I certify that inpatient services furnished can reasonably be expected to improve the patient's condition.   Sabrina RilingHolly R McNew, MD 06/03/2017, 12:39 PM

## 2017-06-04 MED ORDER — LORAZEPAM 1 MG PO TABS
1.0000 mg | ORAL_TABLET | Freq: Four times a day (QID) | ORAL | Status: DC | PRN
  Administered 2017-06-05: 1 mg via ORAL
  Filled 2017-06-04: qty 1

## 2017-06-04 MED ORDER — CALCIUM CARBONATE ANTACID 500 MG PO CHEW
2.0000 | CHEWABLE_TABLET | Freq: Three times a day (TID) | ORAL | Status: DC
  Administered 2017-06-04 – 2017-06-05 (×4): 400 mg via ORAL
  Filled 2017-06-04 (×7): qty 2

## 2017-06-04 NOTE — Plan of Care (Signed)
Patient slept for Estimated Hours of 6.30; Precautionary checks every 15 minutes for safety maintained, room free of safety hazards, patient sustains no injury or falls during this shift.  Problem: Education: Goal: Utilization of techniques to improve thought processes will improve Outcome: Progressing Goal: Knowledge of the prescribed therapeutic regimen will improve Outcome: Progressing   Problem: Coping: Goal: Coping ability will improve Outcome: Progressing Goal: Will verbalize feelings Outcome: Progressing   Problem: Health Behavior/Discharge Planning: Goal: Identification of resources available to assist in meeting health care needs will improve Outcome: Progressing   Problem: Self-Concept: Goal: Ability to disclose and discuss suicidal ideas will improve Outcome: Progressing Goal: Will verbalize positive feelings about self Outcome: Progressing   Problem: Education: Goal: Knowledge of General Education information will improve Outcome: Progressing   Problem: Health Behavior/Discharge Planning: Goal: Ability to manage health-related needs will improve Outcome: Progressing   Problem: Clinical Measurements: Goal: Ability to maintain clinical measurements within normal limits will improve Outcome: Progressing Goal: Will remain free from infection Outcome: Progressing Goal: Diagnostic test results will improve Outcome: Progressing Goal: Respiratory complications will improve Outcome: Progressing Goal: Cardiovascular complication will be avoided Outcome: Progressing   Problem: Activity: Goal: Risk for activity intolerance will decrease Outcome: Progressing   Problem: Nutrition: Goal: Adequate nutrition will be maintained Outcome: Progressing   Problem: Coping: Goal: Level of anxiety will decrease Outcome: Progressing   Problem: Elimination: Goal: Will not experience complications related to bowel motility Outcome: Progressing Goal: Will not experience  complications related to urinary retention Outcome: Progressing   Problem: Pain Managment: Goal: General experience of comfort will improve Outcome: Progressing   Problem: Safety: Goal: Ability to remain free from injury will improve Outcome: Progressing   Problem: Skin Integrity: Goal: Risk for impaired skin integrity will decrease Outcome: Progressing

## 2017-06-04 NOTE — Progress Notes (Signed)
Patient ID: Sabrina HatchetJade Berkey, female   DOB: [birth year], 22 y.o.   MRN: [id number] Visible in the day room, improved appearance and hygiene, denied pain, denied SI/HI/AVH, participated in group activities, complied with medications at bedtime.

## 2017-06-04 NOTE — Progress Notes (Signed)
[facility]  06/04/2017 1:49 PM Sabrina Blevins  MRN:  [id number]   Subjective:  Pt states that she slept much better last night. She has been socializing with peers on the unit and playing cards and laughing with them. She had a visit by her mother and went well. She denies SI or any thoughts of self harm. She is attend groups and actually has enjoyed them to her pleasant surprise. She states taht if the groups at [facility] were as small as they are here that she would like that. Lorella NimrodHarvey with [facility] did meet with her and reassured her that they are small groups. She feels they would be beneficial because she identifies one of her problems of being that she isolates and does not have much socialization. She is also going to be set up with a peer which she is looking froward to. She states that she felt slightly jittery after taking her first dose of Lamictal but feels it may be due to anxiety with being in the hospital. She states that she is very much looking forward to her birthday and found out that her friends were going to take her to [place] over the weekend.   Principal Problem: Major depressive disorder, recurrent episode with mixed features (HCC) Diagnosis:   Patient Active Problem List   Diagnosis Date Noted  . Major depressive disorder, recurrent episode with mixed features (HCC) [F33.9] 06/03/2017    Priority: High  . Suicidal ideation [R45.851] 06/02/2017   Total Time spent with patient: 20 minutes  Past Psychiatric History: See H&p  Past Medical History:  Past Medical History:  Diagnosis Date  . Airborne allergy, current reaction   . Asthma   . Bipolar disorder (HCC)   . Cholecystitis   . Morbid obesity (HCC)   . Seizures (HCC)     Past Surgical History:  Procedure Laterality Date  . ADENOIDECTOMY    . TONSILLECTOMY     Family History: History reviewed. No pertinent family history. Family Psychiatric  History: See H&P Social History:  Social History   Substance and Sexual  Activity  Alcohol Use No     Social History   Substance and Sexual Activity  Drug Use No    Social History   Socioeconomic History  . Marital status: Single    Spouse name: Not on file  . Number of children: Not on file  . Years of education: Not on file  . Highest education level: Not on file  Occupational History  . Not on file  Social Needs  . Financial resource strain: Not on file  . Food insecurity:    Worry: Not on file    Inability: Not on file  . Transportation needs:    Medical: Not on file    Non-medical: Not on file  Tobacco Use  . Smoking status: Current Some Day Smoker  . Smokeless tobacco: Never Used  Substance and Sexual Activity  . Alcohol use: No  . Drug use: No  . Sexual activity: Not on file  Lifestyle  . Physical activity:    Days per week: Not on file    Minutes per session: Not on file  . Stress: Not on file  Relationships  . Social connections:    Talks on phone: Not on file    Gets together: Not on file    Attends religious service: Not on file    Active member of club or organization: Not on file    Attends meetings  of clubs or organizations: Not on file    Relationship status: Not on file  Other Topics Concern  . Not on file  Social History Narrative  . Not on file   Additional Social History:                         Sleep: Fair-improving  Appetite:  Good  Current Medications: Current Facility-Administered Medications  Medication Dose Route Frequency Provider Last Rate Last Dose  . acetaminophen (TYLENOL) tablet 650 mg  650 mg Oral Q6H PRN Clapacs, John T, MD      . alum & mag hydroxide-simeth (MAALOX/MYLANTA) 200-200-20 MG/5ML suspension 30 mL  30 mL Oral Q4H PRN Clapacs, John T, MD      . calcium carbonate (TUMS - dosed in mg elemental calcium) chewable tablet 400 mg of elemental calcium  2 tablet Oral TID WC McNew, Holly R, MD   400 mg of elemental calcium at 06/04/17 1234  . lamoTRIgine (LAMICTAL) tablet 25 mg  25  mg Oral Daily McNew, Ileene Hutchinson, MD   25 mg at 06/04/17 0846  . LORazepam (ATIVAN) tablet 1 mg  1 mg Oral Q6H PRN McNew, Ileene Hutchinson, MD      . magnesium hydroxide (MILK OF MAGNESIA) suspension 30 mL  30 mL Oral Daily PRN Clapacs, Jackquline Denmark, MD      . traZODone (DESYREL) tablet 100 mg  100 mg Oral QHS McNew, Ileene Hutchinson, MD   100 mg at 06/03/17 2118    Lab Results:  Results for orders placed or performed during the hospital encounter of 06/02/17 (from the past 48 hour(s))  Hemoglobin A1c     Status: Abnormal   Collection Time: 06/03/17  7:05 AM  Result Value Ref Range   Hgb A1c MFr Bld 6.0 (H) 4.8 - 5.6 %    Comment: (NOTE) Pre diabetes:          5.7%-6.4% Diabetes:              >6.4% Glycemic control for   <7.0% adults with diabetes    Mean Plasma Glucose 125.5 mg/dL    Comment: Performed at [facility], [address] [place], [place] [address]  Lipid panel     Status: Abnormal   Collection Time: 06/03/17  7:05 AM  Result Value Ref Range   Cholesterol 181 0 - 200 mg/dL   Triglycerides 914 (H) <150 mg/dL   HDL 40 (L) >78 mg/dL   Total CHOL/HDL Ratio 4.5 RATIO   VLDL 38 0 - 40 mg/dL   LDL Cholesterol 295 (H) 0 - 99 mg/dL    Comment:        Total Cholesterol/HDL:CHD Risk Coronary Heart Disease Risk Table                     Men   Women  1/2 Average Risk   3.4   3.3  Average Risk       5.0   4.4  2 X Average Risk   9.6   7.1  3 X Average Risk  23.4   11.0        Use the calculated Patient Ratio above and the CHD Risk Table to determine the patient's CHD Risk.        ATP III CLASSIFICATION (LDL):  <100     mg/dL   Optimal  621-308  mg/dL   Near or Above  Optimal  130-159  mg/dL   Borderline  161-096  mg/dL   High  >045     mg/dL   Very High Performed at [facility], [address] [place], [place] [address]   TSH     Status: Abnormal   Collection Time: 06/03/17  7:05 AM  Result Value Ref Range   TSH 5.183 (H) 0.350 - 4.500 uIU/mL    Comment:  Performed by a 3rd Generation assay with a functional sensitivity of <=0.01 uIU/mL. Performed at [facility], [address] [place], [place] [address]   T4, free     Status: None   Collection Time: 06/03/17  7:05 AM  Result Value Ref Range   Free T4 1.05 0.61 - 1.12 ng/dL    Comment: (NOTE) Biotin ingestion may interfere with free T4 tests. If the results are inconsistent with the TSH level, previous test results, or the clinical presentation, then consider biotin interference. If needed, order repeat testing after stopping biotin. Performed at [facility], [address] [place], [place] [address]   Glucose, capillary     Status: Abnormal   Collection Time: 06/03/17  5:39 PM  Result Value Ref Range   Glucose-Capillary 196 (H) 65 - 99 mg/dL    Blood Alcohol level:  Lab Results  Component Value Date   ETH <10 06/02/2017    Metabolic Disorder Labs: Lab Results  Component Value Date   HGBA1C 6.0 (H) 06/03/2017   MPG 125.5 06/03/2017   No results found for: PROLACTIN Lab Results  Component Value Date   CHOL 181 06/03/2017   TRIG 192 (H) 06/03/2017   HDL 40 (L) 06/03/2017   CHOLHDL 4.5 06/03/2017   VLDL 38 06/03/2017   LDLCALC 103 (H) 06/03/2017    Physical Findings: AIMS: Facial and Oral Movements Muscles of Facial Expression: None, normal Lips and Perioral Area: None, normal Jaw: None, normal Tongue: None, normal,Extremity Movements Upper (arms, wrists, hands, fingers): None, normal Lower (legs, knees, ankles, toes): None, normal, Trunk Movements Neck, shoulders, hips: None, normal, Overall Severity Severity of abnormal movements (highest score from questions above): None, normal Incapacitation due to abnormal movements: None, normal Patient's awareness of abnormal movements (rate only patient's report): No Awareness, Dental Status Current problems with teeth and/or dentures?: No Does patient usually wear dentures?: No  CIWA:  CIWA-Ar  Total: 0 COWS:  COWS Total Score: 0  Musculoskeletal: Strength & Muscle Tone: within normal limits Gait & Station: normal Patient leans: N/A  Psychiatric Specialty Exam: Physical Exam  Nursing note and vitals reviewed.   Review of Systems  All other systems reviewed and are negative.   Blood pressure (!) 119/38, pulse (!) 120, temperature 98.2 F (36.8 C), temperature source Oral, resp. rate 18, height 5\' 8"  (1.727 m), weight (!) 161 kg (355 lb), last menstrual period 05/15/2017, SpO2 99 %.Body mass index is 53.98 kg/m.  General Appearance: Casual  Eye Contact:  Good  Speech:  Clear and Coherent  Volume:  Normal  Mood:  Euthymic  Affect:  Congruent  Thought Process:  Coherent and Goal Directed  Orientation:  NA  Thought Content:  Logical  Suicidal Thoughts:  No  Homicidal Thoughts:  No  Memory:  Immediate;   Fair  Judgement:  Fair  Insight:  Fair  Psychomotor Activity:  Normal  Concentration:  Concentration: Fair  Recall:  [employer] of Knowledge:  Fair  Language:  Fair  Akathisia:  No      Assets:  Resilience  ADL's:  Intact  Cognition:  WNL  Sleep:  Number of Hours: 6.3     Treatment Plan Summary: 22 yo female admitted due to SI and insomnia. Mood is improving now that she has been around people. Sleep is improving. She appears bright and social with peers. She does not appear manic or psychotic.   Plan:  MDD with mixed features r/o borderline Personality disorder -Continue Lamictal 25 mg daily -Continue trazodone 100 mg qhd  Dispo -She will return home on discharge. She will follow up with [facility] and will be assigned a peer for peer support. She is now interested in group therapy at [facility], MD 06/04/2017, 1:49 PM

## 2017-06-04 NOTE — [facility] Group Notes (Signed)
LCSW Group Therapy Note  06/04/2017 1:00 pm  Type of Therapy/Topic:  Group Therapy:  Emotion Regulation  Participation Level:  Active   Description of Group:    The purpose of this group is to assist patients in learning to regulate negative emotions and experience positive emotions. Patients will be guided to discuss ways in which they have been vulnerable to their negative emotions. These vulnerabilities will be juxtaposed with experiences of positive emotions or situations, and patients will be challenged to use positive emotions to combat negative ones. Special emphasis will be placed on coping with negative emotions in conflict situations, and patients will process healthy conflict resolution skills.  Therapeutic Goals: 1. Patient will identify two positive emotions or experiences to reflect on in order to balance out negative emotions 2. Patient will label two or more emotions that they find the most difficult to experience 3. Patient will demonstrate positive conflict resolution skills through discussion and/or role plays  Summary of Patient Progress:  Lesly RubensteinJade actively participated in today's group.  She was able to identify several positive emotions that she has experienced to include joy, excited as well as difficult emotions which include anxiety, lonely, distrust, and discomfort.  Lesly RubensteinJade shared that her past trauma often trigger her difficult emotions.  Lesly RubensteinJade shared that she has learned to "walk away" from conflict and use a variety of coping strategies to help her to include engaging in artistic activities.     Therapeutic Modalities:   Cognitive Behavioral Therapy Feelings Identification Dialectical Behavioral Therapy

## 2017-06-04 NOTE — Progress Notes (Signed)
Recreation Therapy Notes  Date: 06/04/2017  Time: 9:30 am  Location: Craft Room  Behavioral response: Appropriate  Intervention Topic: Communication  Discussion/Intervention:  Group content today was focused on communication. The group defined communication and ways to communicate with others. Individuals stated reason why communication is important and some reasons to communicate with others. Patients expressed if they thought they were good at communicating with others and ways they could improve their communication skills. The group identified important parts of communication and some experiences they have had in the past with communication. The group participated in the intervention "Board Scramble", where patients had a chance to test out their communication skills and identify ways to improve their communication techniques.  Clinical Observations/Feedback:  Patient came to group and stated body language a way she communicates with others. Individual described that eye contact and body language is important because it gives off a certain vibe. She participated in the intervention and was social with peers and staff during group.  Glen Kesinger LRT/CTRS         Elisa Sorlie 06/04/2017 11:23 AM

## 2017-06-04 NOTE — Plan of Care (Signed)
Patient is alert and oriented X 4. Patient denies HI and AVH. Patient states she has been having passive SI, but agreed to talk with staff if she begins to feel unsafe. Patient stated her goal today is to socialize with others. Patient has been interacting with peers, and attending groups. Patient is able to voice feelings, and is knowledgeable of medications. Nurse will continue to monitor. Problem: Coping: Goal: Coping ability will improve Outcome: Progressing

## 2017-06-04 NOTE — Tx Team (Addendum)
Interdisciplinary Treatment and Diagnostic Plan Update  06/04/2017 Time of Session: Sabrina Blevins MRN: [id number]  Principal Diagnosis: Major depressive disorder, recurrent episode with mixed features [facility])  Secondary Diagnoses: Principal Problem:   Major depressive disorder, recurrent episode with mixed features (HCC)   Current Medications:  Current Facility-Administered Medications  Medication Dose Route Frequency Provider Last Rate Last Dose  . acetaminophen (TYLENOL) tablet 650 mg  650 mg Oral Q6H PRN Clapacs, John T, MD      . alum & mag hydroxide-simeth (MAALOX/MYLANTA) 200-200-20 MG/5ML suspension 30 mL  30 mL Oral Q4H PRN Clapacs, John T, MD      . calcium carbonate (TUMS - dosed in mg elemental calcium) chewable tablet 400 mg of elemental calcium  2 tablet Oral TID WC McNew, Holly R, MD   400 mg of elemental calcium at 06/04/17 1234  . lamoTRIgine (LAMICTAL) tablet 25 mg  25 mg Oral Daily McNew, Ileene Hutchinson, MD   25 mg at 06/04/17 0846  . LORazepam (ATIVAN) tablet 1 mg  1 mg Oral Q6H PRN McNew, Holly R, MD      . magnesium hydroxide (MILK OF MAGNESIA) suspension 30 mL  30 mL Oral Daily PRN Clapacs, John T, MD      . traZODone (DESYREL) tablet 100 mg  100 mg Oral QHS McNew, Ileene Hutchinson, MD   100 mg at 06/03/17 2118   PTA Medications: No medications prior to admission.    Patient Stressors: Financial difficulties Health problems Medication change or noncompliance Occupational concerns  Patient Strengths: Average or above average intelligence Capable of independent living Motivation for treatment/growth Physical Health  Treatment Modalities: Medication Management, Group therapy, Case management,  1 to 1 session with clinician, Psychoeducation, Recreational therapy.   Physician Treatment Plan for Primary Diagnosis: Major depressive disorder, recurrent episode with mixed features (HCC) Long Term Goal(s): Improvement in symptoms so as ready for discharge   Short Term Goals:  Ability to disclose and discuss suicidal ideas  Medication Management: Evaluate patient's response, side effects, and tolerance of medication regimen.  Therapeutic Interventions: 1 to 1 sessions, Unit Group sessions and Medication administration.  Evaluation of Outcomes: Progressing  Physician Treatment Plan for Secondary Diagnosis: Principal Problem:   Major depressive disorder, recurrent episode with mixed features (HCC)  Long Term Goal(s): Improvement in symptoms so as ready for discharge   Short Term Goals: Ability to disclose and discuss suicidal ideas     Medication Management: Evaluate patient's response, side effects, and tolerance of medication regimen.  Therapeutic Interventions: 1 to 1 sessions, Unit Group sessions and Medication administration.  Evaluation of Outcomes: Progressing   RN Treatment Plan for Primary Diagnosis: Major depressive disorder, recurrent episode with mixed features (HCC) Long Term Goal(s): Knowledge of disease and therapeutic regimen to maintain health will improve  Short Term Goals: Ability to verbalize feelings will improve, Ability to identify and develop effective coping behaviors will improve and Compliance with prescribed medications will improve  Medication Management: RN will administer medications as ordered by provider, will assess and evaluate patient's response and provide education to patient for prescribed medication. RN will report any adverse and/or side effects to prescribing provider.  Therapeutic Interventions: 1 on 1 counseling sessions, Psychoeducation, Medication administration, Evaluate responses to treatment, Monitor vital signs and CBGs as ordered, Perform/monitor CIWA, COWS, AIMS and Fall Risk screenings as ordered, Perform wound care treatments as ordered.  Evaluation of Outcomes: Progressing   LCSW Treatment Plan for Primary Diagnosis: Major depressive disorder, recurrent episode with mixed  features [facility](HCC) Long Term  Goal(s): Safe transition to appropriate next level of care at discharge, Engage patient in therapeutic group addressing interpersonal concerns.  Short Term Goals: Engage patient in aftercare planning with referrals and resources, Increase social support, Increase ability to appropriately verbalize feelings and Increase skills for wellness and recovery  Therapeutic Interventions: Assess for all discharge needs, 1 to 1 time with Social worker, Explore available resources and support systems, Assess for adequacy in community support network, Educate family and significant other(s) on suicide prevention, Complete Psychosocial Assessment, Interpersonal group therapy.  Evaluation of Outcomes: Progressing   Progress in Treatment: Attending groups: Yes. Participating in groups: Yes. Taking medication as prescribed: Yes. Toleration medication: Yes. Family/Significant other contact made: No, will contact:  Patient refused family contact Patient understands diagnosis: Yes. Discussing patient identified problems/goals with staff: Yes. Medical problems stabilized or resolved: Yes. Denies suicidal/homicidal ideation: Yes. Issues/concerns per patient self-inventory: No. Other:   New problem(s) identified: No, Describe:  None  New Short Term/Long Term Goal(s): "To get on medications and to socialize with others more."   Discharge Plan or Barriers: To return home and [facility] up [facility].  Reason for Continuation of Hospitalization: Depression Medication stabilization  Estimated Length of Stay: 2 days  Recreational Therapy: Patient Stressors: Family, Death, Finances Patient Goal: Patient will identify 3 new relaxation techniques to better manage anxiety within 5 recreation therapy group sessions   Attendees: Patient: Sabrina Blevins 06/04/2017 12:41 PM  Physician: Corinna GabHolly McNew, MD 06/04/2017 12:41 PM  Nursing: Lyn HenriGwen Pharrish, RN 06/04/2017 12:41 PM  RN Care Manager: 06/04/2017  12:41 PM  Social Worker: Johny Shearsassandra Jarrett, LCSWA 06/04/2017 12:41 PM  Recreational Therapist: Danella DeisShay. Dreama SaaOutlaw CTRS, LRT 06/04/2017 12:41 PM  Other: Heidi DachKelsey Craig LCSW 06/04/2017 12:41 PM  Other: Huey RomansSonya Carter, LCSW 06/04/2017 12:41 PM  Other: Unk PintoHarvey Bryant- [facility] 06/04/2017 12:41 PM    Scribe for Treatment Team: Johny Shearsassandra  Jarrett, LCSW 06/04/2017 12:41 PM

## 2017-06-05 MED ORDER — TRAZODONE HCL 100 MG PO TABS: 100.0000 mg | ORAL_TABLET | Freq: Every day | ORAL | 0 refills | Status: DC

## 2017-06-05 MED ORDER — LAMOTRIGINE 25 MG PO TABS: 25.0000 mg | ORAL_TABLET | Freq: Every day | ORAL | 0 refills | Status: DC

## 2017-06-05 MED ORDER — LAMOTRIGINE 25 MG PO TABS: 50.0000 mg | ORAL_TABLET | Freq: Every day | ORAL | 0 refills | Status: DC

## 2017-06-05 NOTE — Progress Notes (Signed)
Recreation Therapy Notes  Date: 06/05/2017  Time: 9:30 am  Location: Craft Room  Behavioral response: Appropriate  Intervention Topic: Creative Expressions  Discussion/Intervention:  Group content on today was focused on creative expressions. The group defined creative expressions and ways they use creative expressions. Individual identified other positive ways creative expressions can be used and why it is important to express yourself. Patients participated in the intervention "expressive painting", where they had a chance to creatively express themselves.  Clinical Observations/Feedback:  Patient came to group and defined creative expressions as using art to express feelings. She explained she uses music and writing to creatively express herself. Individual stated she participated in creative expressions to relieve stress.   Manford Sprong LRT/CTRS           Karee Christopherson 06/05/2017 11:43 AM

## 2017-06-05 NOTE — Progress Notes (Signed)
[facility] MD Progress Note  06/05/2017 3:04 PM Sabrina Blevins  MRN:  [id number] Subjective:  Pt states that she had a rough night. She feels that she has been discriminated against by certain staff last night. She got upset and isolated to her room. She is feeling much better today and mood is improving. She is observed most of the day yesterday and today socializing with many peers. She has bright affect and smiling and laughing. She states taht it feels good to socialize again. She is really enjoying groups and plans to continue them with [facility]. She is also excited to have peer su pport. She denies SI or any thoughts of self harm. Denies AH, VH. She is tolerating medications. She is sleeping much better now. She is looking forward to her birthday on Monday.   Principal Problem: Major depressive disorder, recurrent episode with mixed features (HCC) Diagnosis:   Patient Active Problem List   Diagnosis Date Noted  . Major depressive disorder, recurrent episode with mixed features (HCC) [F33.9] 06/03/2017    Priority: High  . Suicidal ideation [R45.851] 06/02/2017   Total Time spent with patient: 20 minutes  Past Psychiatric History: See H&p  Past Medical History:  Past Medical History:  Diagnosis Date  . Airborne allergy, current reaction   . Asthma   . Bipolar disorder (HCC)   . Cholecystitis   . Morbid obesity (HCC)   . Seizures (HCC)     Past Surgical History:  Procedure Laterality Date  . ADENOIDECTOMY    . TONSILLECTOMY     Family History: History reviewed. No pertinent family history. Family Psychiatric  History: See h&P Social History:  Social History   Substance and Sexual Activity  Alcohol Use No     Social History   Substance and Sexual Activity  Drug Use No    Social History   Socioeconomic History  . Marital status: Single    Spouse name: Not on file  . Number of children: Not on file  . Years of education: Not on file  . Highest education level: Not on file   Occupational History  . Not on file  Social Needs  . Financial resource strain: Not on file  . Food insecurity:    Worry: Not on file    Inability: Not on file  . Transportation needs:    Medical: Not on file    Non-medical: Not on file  Tobacco Use  . Smoking status: Current Some Day Smoker  . Smokeless tobacco: Never Used  Substance and Sexual Activity  . Alcohol use: No  . Drug use: No  . Sexual activity: Not on file  Lifestyle  . Physical activity:    Days per week: Not on file    Minutes per session: Not on file  . Stress: Not on file  Relationships  . Social connections:    Talks on phone: Not on file    Gets together: Not on file    Attends religious service: Not on file    Active member of club or organization: Not on file    Attends meetings of clubs or organizations: Not on file    Relationship status: Not on file  Other Topics Concern  . Not on file  Social History Narrative  . Not on file   Additional Social History:                         Sleep: Good  Appetite:  Good  Current Medications: Current Facility-Administered Medications  Medication Dose Route Frequency Provider Last Rate Last Dose  . acetaminophen (TYLENOL) tablet 650 mg  650 mg Oral Q6H PRN Clapacs, John T, MD      . alum & mag hydroxide-simeth (MAALOX/MYLANTA) 200-200-20 MG/5ML suspension 30 mL  30 mL Oral Q4H PRN Clapacs, John T, MD      . calcium carbonate (TUMS - dosed in mg elemental calcium) chewable tablet 400 mg of elemental calcium  2 tablet Oral TID WC Bowden Boody R, MD   400 mg of elemental calcium at 06/05/17 1237  . lamoTRIgine (LAMICTAL) tablet 25 mg  25 mg Oral Daily Victoriana Aziz, Ileene HutchinsonHolly R, MD   25 mg at 06/05/17 0900  . LORazepam (ATIVAN) tablet 1 mg  1 mg Oral Q6H PRN Kynzleigh Bandel, Ileene HutchinsonHolly R, MD      . magnesium hydroxide (MILK OF MAGNESIA) suspension 30 mL  30 mL Oral Daily PRN Clapacs, Jackquline DenmarkJohn T, MD      . traZODone (DESYREL) tablet 100 mg  100 mg Oral QHS Lallie Strahm, Ileene HutchinsonHolly R, MD    100 mg at 06/04/17 2117    Lab Results:  Results for orders placed or performed during the hospital encounter of 06/02/17 (from the past 48 hour(s))  Glucose, capillary     Status: Abnormal   Collection Time: 06/03/17  5:39 PM  Result Value Ref Range   Glucose-Capillary 196 (H) 65 - 99 mg/dL    Blood Alcohol level:  Lab Results  Component Value Date   ETH <10 06/02/2017    Metabolic Disorder Labs: Lab Results  Component Value Date   HGBA1C 6.0 (H) 06/03/2017   MPG 125.5 06/03/2017   No results found for: PROLACTIN Lab Results  Component Value Date   CHOL 181 06/03/2017   TRIG 192 (H) 06/03/2017   HDL 40 (L) 06/03/2017   CHOLHDL 4.5 06/03/2017   VLDL 38 06/03/2017   LDLCALC 103 (H) 06/03/2017    Physical Findings: AIMS: Facial and Oral Movements Muscles of Facial Expression: None, normal Lips and Perioral Area: None, normal Jaw: None, normal Tongue: None, normal,Extremity Movements Upper (arms, wrists, hands, fingers): None, normal Lower (legs, knees, ankles, toes): None, normal, Trunk Movements Neck, shoulders, hips: None, normal, Overall Severity Severity of abnormal movements (highest score from questions above): None, normal Incapacitation due to abnormal movements: None, normal Patient's awareness of abnormal movements (rate only patient's report): No Awareness, Dental Status Current problems with teeth and/or dentures?: No Does patient usually wear dentures?: No  CIWA:  CIWA-Ar Total: 0 COWS:  COWS Total Score: 0  Musculoskeletal: Strength & Muscle Tone: within normal limits Gait & Station: normal Patient leans: N/A  Psychiatric Specialty Exam: Physical Exam  Nursing note and vitals reviewed.   Review of Systems  All other systems reviewed and are negative.   Blood pressure (!) 111/41, pulse 97, temperature 98.2 F (36.8 C), temperature source Oral, resp. rate 18, height 5\' 8"  (1.727 m), weight (!) 161 kg (355 lb), last menstrual period 05/15/2017,  SpO2 99 %.Body mass index is 53.98 kg/m.  General Appearance: Casual  Eye Contact:  Good  Speech:  Clear and Coherent  Volume:  Normal  Mood:  Euthymic  Affect:  Appropriate  Thought Process:  Coherent and Goal Directed  Orientation:  Full (Time, Place, and Person)  Thought Content:  Logical  Suicidal Thoughts:  No  Homicidal Thoughts:  No  Memory:  Immediate;   Fair  Judgement:  Fair  Insight:  Fair  Psychomotor  Activity:  Normal  Concentration:  Concentration: Fair  Recall:  [employer] of Knowledge:  Fair  Language:  Fair  Akathisia:  No      Assets:  Resilience  ADL's:  Intact  Cognition:  WNL  Sleep:  Number of Hours: 6.3     Treatment Plan Summary: 22 yo female admitted due to depression, insomnia and SI. Mood is improving and SI has resolved. She is social on the unit and sleeping much better. She does not appear manic at all.   Plan:  MDD -Continue Lamictal 25 mg daily. -trazodone for sleep  Dispo -She will discharge home tomorrow and follow up with [facility] next week.   Haskell Riling, MD 06/05/2017, 3:04 PM

## 2017-06-05 NOTE — [facility] Group Notes (Signed)
[facility] Group Notes:  (Nursing/MHT/Case Management/Adjunct)  Date:  06/05/2017  Time:  11:03 PM  Type of Therapy:  Group Therapy  Participation Level:  Active  Participation Quality:  Appropriate  Affect:  Appropriate  Cognitive:  Alert  Insight:  Good  Engagement in Group:  Engaged  Modes of Intervention:  Support  Summary of Progress/Problems:  Sabrina Blevins 06/05/2017, 11:03 PM

## 2017-06-05 NOTE — Progress Notes (Signed)
D- Patient alert and oriented. Patient presents in a pleasant mood stating that she "slept good, just the medication is making me tired". Patient rates her depression a "3/10" stating that "being in here" is making her depressed. Patient's anxiety level is a "10/10" reporting to this writer that she feels this way because "everything that was going on last night has my anxiety through the roof". Patient denies SI, HI, AVH, and pain at this time. Patient's goal for today is "getting my medication situated", in which she will "talk to the doctor" to accomplish her goal.  A- Scheduled medications administered to patient, per MD orders. Support and encouragement provided.  Routine safety checks conducted every 15 minutes.  Patient informed to notify staff with problems or concerns.  R- No adverse drug reactions noted. Patient contracts for safety at this time. Patient compliant with medications and treatment plan. Patient receptive, calm, and cooperative. Patient interacts well with others on the unit.  Patient remains safe at this time.

## 2017-06-05 NOTE — Progress Notes (Signed)
Patient ID: Sabrina HatchetJade Blevins, female   DOB: [birth year], 22 y.o.   MRN: [id number] Improved mood and affect, interacting well with peers and staffs, denied pain, denied SI/HI/AVH, medication compliant.

## 2017-06-05 NOTE — Progress Notes (Signed)
Chaplain responded to an Order Request to speak with the patient about suicidal thoughts. The patient said that she was overwhelmed and had anxiety last night, but fell short of saying that she was/is suicidal. She blamed the loud actions of others as creating her anxiety.  Patient explained the many losses she experienced in 2018 and Chaplain offered a suggestion to take advantage of [facility] at [facility] [address]. It is a free resource. Chaplain prayed with the patient and suggested that she focus on plan of recovery, regardless of the actions/behaviors of those on the unit.

## 2017-06-05 NOTE — [facility] Group Notes (Signed)
LCSW Group Therapy Note 06/05/2017 9:00 AM  Type of Therapy and Topic:  Group Therapy:  Setting Goals  Participation Level:  Minimal  Description of Group: In this process group, patients discussed using strengths to work toward goals and address challenges.  Patients identified two positive things about themselves and one goal they were working on.  Patients were given the opportunity to share openly and support each other's plan for self-empowerment.  The group discussed the value of gratitude and were encouraged to have a daily reflection of positive characteristics or circumstances.  Patients were encouraged to identify a plan to utilize their strengths to work on current challenges and goals.  Therapeutic Goals 1. Patient will verbalize personal strengths/positive qualities and relate how these can assist with achieving desired personal goals 2. Patients will verbalize affirmation of peers plans for personal change and goal setting 3. Patients will explore the value of gratitude and positive focus as related to successful achievement of goals 4. Patients will verbalize a plan for regular reinforcement of personal positive qualities and circumstances.  Summary of Patient Progress:  Sabrina Blevins presented to group today somewhat anxious and preoccupied.  She was not able to actively participate as she did in group yesterday.  Sabrina Blevins shared that she did have a better understanding about how she can develop her own SMART goals and that her goal for today is "be more active".     Therapeutic Modalities Cognitive Behavioral Therapy Motivational Interviewing    Sabrina Blevins, [place] 06/05/2017 3:20 PM

## 2017-06-05 NOTE — Plan of Care (Signed)
Patient slept for Estimated Hours of 6.30; Precautionary checks every 15 minutes for safety maintained, room free of safety hazards, patient sustains no injury or falls during this shift.  Problem: Education: Goal: Utilization of techniques to improve thought processes will improve Outcome: Progressing Goal: Knowledge of the prescribed therapeutic regimen will improve Outcome: Progressing   Problem: Coping: Goal: Coping ability will improve Outcome: Progressing Goal: Will verbalize feelings Outcome: Progressing   Problem: Health Behavior/Discharge Planning: Goal: Identification of resources available to assist in meeting health care needs will improve Outcome: Progressing   Problem: Self-Concept: Goal: Ability to disclose and discuss suicidal ideas will improve Outcome: Progressing Goal: Will verbalize positive feelings about self Outcome: Progressing   Problem: Education: Goal: Knowledge of General Education information will improve Outcome: Progressing   Problem: Health Behavior/Discharge Planning: Goal: Ability to manage health-related needs will improve Outcome: Progressing   Problem: Clinical Measurements: Goal: Ability to maintain clinical measurements within normal limits will improve Outcome: Progressing Goal: Will remain free from infection Outcome: Progressing Goal: Diagnostic test results will improve Outcome: Progressing Goal: Respiratory complications will improve Outcome: Progressing Goal: Cardiovascular complication will be avoided Outcome: Progressing   Problem: Activity: Goal: Risk for activity intolerance will decrease Outcome: Progressing   Problem: Nutrition: Goal: Adequate nutrition will be maintained Outcome: Progressing   Problem: Coping: Goal: Level of anxiety will decrease Outcome: Progressing   Problem: Elimination: Goal: Will not experience complications related to bowel motility Outcome: Progressing Goal: Will not experience  complications related to urinary retention Outcome: Progressing   Problem: Pain Managment: Goal: General experience of comfort will improve Outcome: Progressing   Problem: Safety: Goal: Ability to remain free from injury will improve Outcome: Progressing   Problem: Skin Integrity: Goal: Risk for impaired skin integrity will decrease Outcome: Progressing   Problem: Spiritual Needs Goal: Ability to function at adequate level Outcome: Progressing

## 2017-06-05 NOTE — [facility] Group Notes (Signed)
06/05/2017  Time: 0900  Type of Therapy/Topic:  Group Therapy:  Balance in Life  Participation Level:  Active  Description of Group:   This group will address the concept of balance and how it feels and looks when one is unbalanced. Patients will be encouraged to process areas in their lives that are out of balance and identify reasons for remaining unbalanced. Facilitators will guide patients in utilizing problem-solving interventions to address and correct the stressor making their life unbalanced. Understanding and applying boundaries will be explored and addressed for obtaining and maintaining a balanced life. Patients will be encouraged to explore ways to assertively make their unbalanced needs known to significant others in their lives, using other group members and facilitator for support and feedback.  Therapeutic Goals: 1. Patient will identify two or more emotions or situations they have that consume much of in their lives. 2. Patient will identify signs/triggers that life has become out of balance:  3. Patient will identify two ways to set boundaries in order to achieve balance in their lives:  4. Patient will demonstrate ability to communicate their needs through discussion and/or role plays  Summary of Patient Progress: Pt continues to work towards their tx goals but has not yet reached them. Pt was able to appropriately participate in group discussion, and was able to offer support/validation to other group members. Pt reported she feels she devotes too much attention to, "my mental health and finances." Pt reported she would like to devote more attention to, "my personal relationships." Pt reported one way she can achieve a better balance in life is to, "be more social."    Therapeutic Modalities:   Cognitive Behavioral Therapy Solution-Focused Therapy Assertiveness Training  Heidi DachKelsey Loyed Wilmes, MSW, LCSW Clinical Social Worker 06/05/2017 2:05 PM

## 2017-06-05 NOTE — Plan of Care (Addendum)
  D: Pt denies SI/HI/AVH. Pt is pleasant and cooperative. Pt stated she was feeling ok, pt got some bad news about someone taking the [facility] [place] [facility] [place]. Pt was upset, but after talking to writer pt appeared pleasant.   A: Pt was offered support and encouragement. Pt was given scheduled medications. Pt was encourage to attend groups. Q 15 minute checks were done for safety.   R:Pt attends groups and interacts well with peers and staff. Pt is taking medication. Pt has no complaints at this time .Pt receptive to treatment and safety maintained on unit.   Problem: Education: Goal: Knowledge of the prescribed therapeutic regimen will improve Outcome: Progressing   Problem: Coping: Goal: Coping ability will improve Outcome: Progressing   Problem: Coping: Goal: Will verbalize feelings Outcome: Progressing   Problem: Self-Concept: Goal: Will verbalize positive feelings about self Outcome: Progressing   Problem: Pain Managment: Goal: General experience of comfort will improve Outcome: Progressing   Problem: Safety: Goal: Ability to remain free from injury will improve Outcome: Progressing

## 2017-06-05 NOTE — Plan of Care (Signed)
Patient has the ability to utilize techniques to improve her thought processes. Patient verbalizes understanding of the general information that has been provided to her as well as her prescribed therapeutic regimen and has not voiced any further questions or concerns at this time. Patient has the ability to cope and has been out in the milieu as well as attending unit groups and was observed outside in the courtyard playing basketball. Patient has the ability to verbalize feelings by stating to this writer her depression level is a "3/10" and her anxiety level is an "10/10" stating she feels this way because of "being here and everything going on last night". Patient has the ability to identify the available resources to help assist her in meeting her health care needs, however she has not mentioned any to this Clinical research associatewriter today. Patient denies SI/HI/AVH at this time. Patient has been free from any health-related complications. Patient has worked towards keeping her clinical measurements within normal limits. Patient has maintained adequate nutrition and her risk for activity intolerance has decreased. Patient has been functioning at an adequate level and her overall level of comfort has improved.  Patient's risk for impaired skin integrity has decreased. Patient has remained free from injury thus far on the unit and remains safe on the unit at this time.   Problem: Education: Goal: Utilization of techniques to improve thought processes will improve Outcome: Progressing Goal: Knowledge of the prescribed therapeutic regimen will improve Outcome: Progressing   Problem: Coping: Goal: Coping ability will improve Outcome: Progressing Goal: Will verbalize feelings Outcome: Progressing   Problem: Health Behavior/Discharge Planning: Goal: Identification of resources available to assist in meeting health care needs will improve Outcome: Progressing   Problem: Self-Concept: Goal: Ability to disclose and  discuss suicidal ideas will improve Outcome: Progressing Goal: Will verbalize positive feelings about self Outcome: Progressing   Problem: Education: Goal: Knowledge of General Education information will improve Outcome: Progressing   Problem: Health Behavior/Discharge Planning: Goal: Ability to manage health-related needs will improve Outcome: Progressing   Problem: Clinical Measurements: Goal: Ability to maintain clinical measurements within normal limits will improve Outcome: Progressing Goal: Will remain free from infection Outcome: Progressing Goal: Diagnostic test results will improve Outcome: Progressing Goal: Respiratory complications will improve Outcome: Progressing Goal: Cardiovascular complication will be avoided Outcome: Progressing   Problem: Activity: Goal: Risk for activity intolerance will decrease Outcome: Progressing   Problem: Nutrition: Goal: Adequate nutrition will be maintained Outcome: Progressing   Problem: Coping: Goal: Level of anxiety will decrease Outcome: Progressing   Problem: Elimination: Goal: Will not experience complications related to bowel motility Outcome: Progressing Goal: Will not experience complications related to urinary retention Outcome: Progressing   Problem: Pain Managment: Goal: General experience of comfort will improve Outcome: Progressing   Problem: Safety: Goal: Ability to remain free from injury will improve Outcome: Progressing   Problem: Skin Integrity: Goal: Risk for impaired skin integrity will decrease Outcome: Progressing   Problem: Spiritual Needs Goal: Ability to function at adequate level Outcome: Progressing

## 2017-06-06 NOTE — Progress Notes (Signed)
D: Patient denies SI/HI/AVH. Patient verbally contracts for safety. Patient is calm, cooperative and pleasant. Patient is seen in milieu interacting with peers. Patient has no complaints at this time.  A: Patient was assessed by this nurse. Patient was oriented to unit. Patient's safety was maintained on unit. Q x 15 minute observation checks were completed for safety. Patient care plan was reviewed. Patient was offered support and encouragement. Patient was encourage to attend groups, participate in unit activities and continue with plan of care.   R: Patient has no complaints of pain at this time. Patient is receptive to treatment and safety maintained on unit. Patient is prepared to discharge home at this time, Patient is contacting family to receive her later today.

## 2017-06-06 NOTE — Progress Notes (Signed)
Recreation Therapy Notes  Date: 06/06/2017  Time: 9:30 am  Location: Craft Room  Behavioral response: Appropriate  Intervention Topic: Leisure  Discussion/Intervention:  Group content today was focused on leisure. The group defined what leisure is and some positive leisure activities they participate in. Individuals identified the difference between good and bad leisure. Participants expressed how they feel after participating in the leisure of their choice. The group discussed how they go about picking a leisure activity and if others are involved in their leisure activities. The patient stated how many leisure activities they too choose from and reasons why it is important to have leisure time. Individuals participated in the intervention "Leisure Jeopardy" where they had a chance to identify new leisure activities as well as benefits of leisure.  Clinical Observations/Feedback:  Patient came to group and stated that she spends time with her dog during her leisure time. She stated it is important for her to make time for leisure to keep from getting stressed out. Individual participated in the intervention and was social with peers and staff during group.  Sabrina Blevins LRT/CTRS         Tramayne Sebesta 06/06/2017 11:32 AM

## 2017-06-06 NOTE — Progress Notes (Signed)
Recreation Therapy Notes  INPATIENT RECREATION TR PLAN  Patient Details Name: Sabrina Blevins MRN: [id number] DOB: 03/01/1995 Today's Date: 06/06/2017  Rec Therapy Plan Is patient appropriate for Therapeutic Recreation?: Yes Treatment times per week: at least 3 Estimated Length of Stay: 5-7 days TR Treatment/Interventions: Group participation (Comment)  Discharge Criteria Pt will be discharged from therapy if:: Discharged Treatment plan/goals/alternatives discussed and agreed upon by:: Patient/family  Discharge Summary Short term goals set:  Patient will identify 3 new relaxation techniques to better manage anxiety within 5 recreation therapy group sessions Short term goals met: Adequate for discharge Progress toward goals comments: Groups attended Which groups?: Leisure education, Communication, Other (Comment)(Creative expression) Reason goals not met: N/A Therapeutic equipment acquired: N/A Reason patient discharged from therapy: Discharge from hospital Pt/family agrees with progress & goals achieved: Yes Date patient discharged from therapy: 06/06/17      06/06/2017, 11:36 AM  

## 2017-06-06 NOTE — [facility] Suicide Risk Assessment (Signed)
[facility]   Principal Problem: Major depressive disorder, recurrent episode with mixed features [facility](HCC) Discharge Diagnoses:  Patient Active Problem List   Diagnosis Date Noted  . Major depressive disorder, recurrent episode with mixed features (HCC) [F33.9] 06/03/2017    Priority: High  . Suicidal ideation [R45.851] 06/02/2017      Mental Status Per Nursing Assessment::   On Admission:  Self-harm behaviors  Demographic Factors:  Caucasian and Unemployed  Loss Factors: Financial problems/change in socioeconomic status  Historical Factors: Impulsivity  Risk Reduction Factors:   Living with another person, especially a relative, Positive social support, Positive therapeutic relationship and Positive coping skills or problem solving skills  Continued Clinical Symptoms:  Personality Disorders:   Cluster B  Cognitive Features That Contribute To Risk:  None    Suicide Risk:  Minimal Acute Risk: No identifiable suicidal ideation. Follow-up Information    [facility] Go on 06/11/2017.   Why:  Please attend your follow up appointment on Wednesday 06/11/2017 at 7:15am to meet with your peer support Unk PintoHarvey Bryant. Unk PintoHarvey Bryant [phone]. Thank you! Contact information: [address] [place] [address] [phone]           Plan Of Care/Follow-up recommendations: Follow up with [facility] Haskell RilingHolly R McNew, MD 06/06/2017, 8:22 AM

## 2017-06-06 NOTE — Progress Notes (Signed)
  [facility] :  Will you be returning to the same living situation after discharge:  Yes,  Home with family At discharge, do you have transportation home?: Yes,  Mother will come and get Do you have the ability to pay for your medications: Yes,  Referred to a provider who can assist  Release of information consent forms completed and in the chart;  Patient's signature needed at discharge.  Patient to Follow up at: Follow-up Information    [facility] Go on 06/11/2017.   Why:  Please attend your follow up appointment on Wednesday 06/11/2017 at 7:15am to meet with your peer support Unk PintoHarvey Bryant. Unk PintoHarvey Bryant [phone]. Thank you! Contact information: [address] [place] [address] [phone]           Next level of care provider has access to [facility] Link:no  Safety Planning and Suicide Prevention discussed: Yes,  Completed with patient  Have you used any form of tobacco in the last 30 days? (Cigarettes, Smokeless Tobacco, Cigars, and/or Pipes): No  Has patient been referred to the Quitline?: N/A patient is not a smoker  Patient has been referred for addiction treatment: N/A  Johny ShearsCassandra  Sybrina Laning, LCSW 06/06/2017, 8:49 AM

## 2017-06-06 NOTE — Discharge Planning (Signed)
Patient verbalizes readiness for discharge. Follow up plan explained, AVS, Transition record and SRA given. Prescriptions and teaching provided. Patient had no belongings to return. Suicide safety plan completed and signed. Patient verbalizes understanding. Patient denies SI/HI and assures this writer she will seek assistance should that change. Patient discharged to mother in lobby.

## 2017-06-06 NOTE — Discharge Summary (Signed)
Physician Discharge Summary Note  Patient:  Sabrina Blevins is an 22 y.o., female MRN:  [id number] DOB:  November 28, 1995 Patient phone:  [phone] (home)  Patient address:   [address] [place] [address],  Total Time spent with patient: 20 minutes  Plus 20 minutes of medication reconciliation, discharge planning, and discharge documentation   Date of Admission:  06/02/2017 Date of Discharge: 06/06/17  Reason for Admission:  Suicidal thoughts, insomnia  Principal Problem: Major depressive disorder, recurrent episode with mixed features [facility]) Discharge Diagnoses: Patient Active Problem List   Diagnosis Date Noted  . Major depressive disorder, recurrent episode with mixed features (HCC) [F33.9] 06/03/2017    Priority: High  . Suicidal ideation [R45.851] 06/02/2017    Past Psychiatric History: See H&P  Past Medical History:  Past Medical History:  Diagnosis Date  . Airborne allergy, current reaction   . Asthma   . Bipolar disorder (HCC)   . Cholecystitis   . Morbid obesity (HCC)   . Seizures (HCC)     Past Surgical History:  Procedure Laterality Date  . ADENOIDECTOMY    . TONSILLECTOMY     Family History: History reviewed. No pertinent family history. Family Psychiatric  History: See h&P Social History:  Social History   Substance and Sexual Activity  Alcohol Use No     Social History   Substance and Sexual Activity  Drug Use No    Social History   Socioeconomic History  . Marital status: Single    Spouse name: Not on file  . Number of children: Not on file  . Years of education: Not on file  . Highest education level: Not on file  Occupational History  . Not on file  Social Needs  . Financial resource strain: Not on file  . Food insecurity:    Worry: Not on file    Inability: Not on file  . Transportation needs:    Medical: Not on file    Non-medical: Not on file  Tobacco Use  . Smoking status: Current Some Day Smoker  . Smokeless tobacco:  Never Used  Substance and Sexual Activity  . Alcohol use: No  . Drug use: No  . Sexual activity: Not on file  Lifestyle  . Physical activity:    Days per week: Not on file    Minutes per session: Not on file  . Stress: Not on file  Relationships  . Social connections:    Talks on phone: Not on file    Gets together: Not on file    Attends religious service: Not on file    Active member of club or organization: Not on file    Attends meetings of clubs or organizations: Not on file    Relationship status: Not on file  Other Topics Concern  . Not on file  Social History Narrative  . Not on file    Hospital Course:  Pt was started on Lamictal for mood stabilization and depression. She exhibited symptoms of borderline personality disorder and ddi have some staff splitting behaviors. She remained safe on the unit. She consistently denied suicidal or self harm thoughts. She was sleeping much better with trazodone. She was very social with peers on the unit and enjoying playing cards and basketball. She was attending groups and learning a lot of coping skills. She felt it was helpful to sociable with people. She came to enjoy group therapy and is now willing to pursue groups at [facility]> She is also  looking forward to peer support. On day of discharge, she felt her mood was improved. She was tolerating Lamictal well. She denies SI or thoughts of self harm. She is looking forward to her birthday on Monday. She states that her mother is planning a party for her. She feels her mood is stable. Safety plan was discussed in detail and she plans to utilize crisis line (she states that she has the number), contact her peer support or [facility] or return to the ED if she were to feel unsafe. She has been able to reach out for help when needed in the past and plans to continue this. We discussed Lamictal titration schedule. She did not appear manic or psychotic.   The patient is at low risk of imminent suicide. Patient  denied thoughts, intent, or plan for harm to self or others, expressed significant future orientation, and expressed an ability to mobilize assistance for her needs. She is presently void of any contributing psychiatric symptoms, cognitive difficulties, or substance use which would elevate her risk for lethality. Chronic risk for lethality is elevated in light of borderline personality traits, not much social activity.  The chronic risk is presently mitigated by her ongoing desire and [facility] and mobilization of support from family and friends. Chronic risk may elevate if she experiences any significant loss or worsening of symptoms, which can be managed and monitored through outpatient providers. At this time,a cute risk for lethality is low and she is stable for ongoing outpatient management.   Modifiable risk factors were addressed during this hospitalization through appropriate pharmacotherapy and establishment of outpatient follow-up treatment. Some risk factors for suicide are situational (i.e. Unstable housing) or related personality pathology (i.e. Poor coping mechanisms) and thus cannot be further mitigated by continued hospitalization in this setting.    Physical Findings: AIMS: Facial and Oral Movements Muscles of Facial Expression: None, normal Lips and Perioral Area: None, normal Jaw: None, normal Tongue: None, normal,Extremity Movements Upper (arms, wrists, hands, fingers): None, normal Lower (legs, knees, ankles, toes): None, normal, Trunk Movements Neck, shoulders, hips: None, normal, Overall Severity Severity of abnormal movements (highest score from questions above): None, normal Incapacitation due to abnormal movements: None, normal Patient's awareness of abnormal movements (rate only patient's report): No Awareness, Dental Status Current problems with teeth and/or dentures?: No Does patient usually wear dentures?: No  CIWA:  CIWA-Ar Total: 0 COWS:  COWS  Total Score: 0  Musculoskeletal: Strength & Muscle Tone: within normal limits Gait & Station: normal Patient leans: N/A  Psychiatric Specialty Exam: Physical Exam  Nursing note and vitals reviewed.   Review of Systems  All other systems reviewed and are negative.   Blood pressure 115/72, pulse (!) 121, temperature 98.3 F (36.8 C), temperature source Oral, resp. rate 16, height 5\' 8"  (1.727 m), weight (!) 161 kg (355 lb), last menstrual period 05/15/2017, SpO2 98 %.Body mass index is 53.98 kg/m.  General Appearance: Casual  Eye Contact:  Good  Speech:  Clear and Coherent  Volume:  Normal  Mood:  Euthymic  Affect:  Appropriate  Thought Process:  Coherent and Goal Directed  Orientation:  Full (Time, Place, and Person)  Thought Content:  Logical  Suicidal Thoughts:  No  Homicidal Thoughts:  No  Memory:  Immediate;   Fair  Judgement:  Fair  Insight:  Fair  Psychomotor Activity:  Normal  Concentration:  Attention Span: Fair  Recall:  Fis[employer]Fair  Fund of Knowledge:  Fair  Language:  Fair  Akathisia:  No      Assets:  Communication Skills Desire for Improvement Housing Leisure Time Resilience Social Support  ADL's:  Intact  Cognition:  WNL  Sleep:  Number of Hours: 5.3     Have you used any form of tobacco in the last 30 days? (Cigarettes, Smokeless Tobacco, Cigars, and/or Pipes): No  Has this patient used any form of tobacco in the last 30 days? (Cigarettes, Smokeless Tobacco, Cigars, and/or Pipes)No  Blood Alcohol level:  Lab Results  Component Value Date   ETH <10 06/02/2017    Metabolic Disorder Labs:  Lab Results  Component Value Date   HGBA1C 6.0 (H) 06/03/2017   MPG 125.5 06/03/2017   No results found for: PROLACTIN Lab Results  Component Value Date   CHOL 181 06/03/2017   TRIG 192 (H) 06/03/2017   HDL 40 (L) 06/03/2017   CHOLHDL 4.5 06/03/2017   VLDL 38 06/03/2017   LDLCALC 103 (H) 06/03/2017    See Psychiatric Specialty Exam and Suicide Risk  Assessment completed by Attending Physician prior to discharge.  Discharge destination:  Home  Is patient on multiple antipsychotic therapies at discharge:  No   Has Patient had three or more failed trials of antipsychotic monotherapy by history:  No  Recommended Plan for Multiple Antipsychotic Therapies: NA  Discharge Instructions    Diet - low sodium heart healthy   Complete by:  As directed    Increase activity slowly   Complete by:  As directed      Allergies as of 06/06/2017      Reactions   Bee Venom Anaphylaxis   Cinnamon Anaphylaxis   Contrast Media [iodinated Diagnostic Agents] Anaphylaxis   Motrin [ibuprofen] Anaphylaxis   Nutmeg Oil (myristica Oil) Anaphylaxis   Penicillins Anaphylaxis   Has patient had a PCN reaction causing immediate rash, facial/tongue/throat swelling, SOB or lightheadedness with hypotension: Yes Has patient had a PCN reaction causing severe rash involving mucus membranes or skin necrosis: No Has patient had a PCN reaction that required hospitalization: Unknown Has patient had a PCN reaction occurring within the last 10 years: Unknown If all of the above answers are "NO", then may proceed with Cephalosporin use.   Benadryl [diphenhydramine Hcl] Swelling   Pt states reaction is to GENERIC formula only   Topamax [topiramate] Other (See Comments)   Drops BP per mother      Medication List    TAKE these medications     Indication  lamoTRIgine 25 MG tablet Commonly known as:  LAMICTAL Take 2 tablets (50 mg total) by mouth daily. Start after initial prescription titration Notes to patient:  You will start this prescription after initial titration of Lamictal. You will take 25 mg for 2 weeks and then increase to 50 mg daily  Indication:  Depression   lamoTRIgine 25 MG tablet Commonly known as:  LAMICTAL Take 1 tablet (25 mg total) by mouth daily. Take 25 mg daily (1 tablet) for 2 weeks then increase to 50 mg daily (2 tablets)  Indication:   Depression   traZODone 100 MG tablet Commonly known as:  DESYREL Take 1 tablet (100 mg total) by mouth at bedtime.  Indication:  Trouble Sleeping      Follow-up Information    [facility] Go on 06/11/2017.   Why:  Please attend your follow up appointment on Wednesday 06/11/2017 at 7:15am to meet with your peer support Unk Pinto. Unk Pinto [phone]. Thank you! Contact information: [address]  [place] [address] [phone]            Follow-up recommendations: Follow up with [facility]  Signed: Haskell Riling, MD 06/06/2017, 11:41 AM

## 2017-07-03 ENCOUNTER — Emergency Department
Admission: EM | Admit: 2017-07-03 | Discharge: 2017-07-04 | Discharge status: Against Medical Advice | Payer: Medicaid Other | Attending: Emergency Medicine | Admitting: Emergency Medicine

## 2017-07-03 DIAGNOSIS — Z79899 Other long term (current) drug therapy: Secondary | ICD-10-CM | POA: Insufficient documentation

## 2017-07-03 DIAGNOSIS — R1084 Generalized abdominal pain: Secondary | ICD-10-CM

## 2017-07-03 DIAGNOSIS — J45909 Unspecified asthma, uncomplicated: Secondary | ICD-10-CM | POA: Diagnosis not present

## 2017-07-03 DIAGNOSIS — R112 Nausea with vomiting, unspecified: Secondary | ICD-10-CM | POA: Insufficient documentation

## 2017-07-03 DIAGNOSIS — Z5329 Procedure and treatment not carried out because of patient's decision for other reasons: Secondary | ICD-10-CM | POA: Insufficient documentation

## 2017-07-03 DIAGNOSIS — R1013 Epigastric pain: Secondary | ICD-10-CM | POA: Insufficient documentation

## 2017-07-03 DIAGNOSIS — F17200 Nicotine dependence, unspecified, uncomplicated: Secondary | ICD-10-CM | POA: Insufficient documentation

## 2017-07-03 LAB — CBC
HEMATOCRIT: 42.4 % (ref 35.0–47.0)
Hemoglobin: 14.3 g/dL (ref 12.0–16.0)
MCH: 27.2 pg (ref 26.0–34.0)
MCHC: 33.8 g/dL (ref 32.0–36.0)
MCV: 80.6 fL (ref 80.0–100.0)
Platelets: 373 10*3/uL (ref 150–440)
RBC: 5.26 MIL/uL — AB (ref 3.80–5.20)
RDW: 14.2 % (ref 11.5–14.5)
WBC: 20 10*3/uL — AB (ref 3.6–11.0)

## 2017-07-03 MED ORDER — ONDANSETRON HCL 4 MG/2ML IJ SOLN
4.0000 mg | Freq: Once | INTRAMUSCULAR | Status: AC | PRN
  Administered 2017-07-03: 4 mg via INTRAVENOUS
  Filled 2017-07-03: qty 2

## 2017-07-03 NOTE — ED Triage Notes (Signed)
Patient c/o mid abdominal pain, N/V beginning 15 minutes.

## 2017-07-04 LAB — COMPREHENSIVE METABOLIC PANEL
ALT: 31 U/L (ref 14–54)
ANION GAP: 12 (ref 5–15)
AST: 30 U/L (ref 15–41)
Albumin: 4.2 g/dL (ref 3.5–5.0)
Alkaline Phosphatase: 98 U/L (ref 38–126)
BUN: 10 mg/dL (ref 6–20)
CHLORIDE: 103 mmol/L (ref 101–111)
CO2: 22 mmol/L (ref 22–32)
CREATININE: 0.69 mg/dL (ref 0.44–1.00)
Calcium: 9.7 mg/dL (ref 8.9–10.3)
Glucose, Bld: 153 mg/dL — ABNORMAL HIGH (ref 65–99)
POTASSIUM: 3.9 mmol/L (ref 3.5–5.1)
SODIUM: 137 mmol/L (ref 135–145)
Total Bilirubin: 0.6 mg/dL (ref 0.3–1.2)
Total Protein: 8 g/dL (ref 6.5–8.1)

## 2017-07-04 LAB — LIPASE, BLOOD: LIPASE: 30 U/L (ref 11–51)

## 2017-07-04 MED ORDER — HALOPERIDOL LACTATE 5 MG/ML IJ SOLN
2.0000 mg | Freq: Once | INTRAMUSCULAR | Status: DC
Start: 2017-07-04 — End: 2017-07-04

## 2017-07-04 MED ORDER — SODIUM CHLORIDE 0.9 % IV BOLUS: 500.0000 mL | INTRAVENOUS | Status: DC

## 2017-07-04 NOTE — ED Provider Notes (Addendum)
[facility] Emergency Department Provider Note  ____________________________________________   First MD Initiated Contact with Patient 07/03/17 2341     (approximate)  I have reviewed the triage vital signs and the nursing notes.   HISTORY  Chief Complaint Abdominal Pain and Emesis    HPI Sabrina Blevins is a 22 y.o. female with medical history as listed below which also includes episodic epigastric abdominal pain.  She presents by private vehicle for evaluation of acute onset severe epigastric pain, nausea, and a couple of episodes of vomiting.  The symptoms started about 15 minutes prior to arrival.  She had eaten a little bit and then gone to bed and her symptoms started immediately thereafter.  She reports the pain is severe and she had at least 2 episodes of vomiting.  She remains a little bit nauseated although she says it had improved significantly.  It feels similar to prior episodes but worse than prior.  She denies fever/chills, chest pain, shortness of breath, lower abdominal pain.  Nothing in particular makes it better or worse.  She does not take any medications regularly for her episodes of abdominal pain and she does not see a GI doctor.  Her mother states that she is in the process of getting set up with a primary care doctor.  Past Medical History:  Diagnosis Date  . Airborne allergy, current reaction   . Asthma   . Bipolar disorder (HCC)   . Cholecystitis   . Morbid obesity (HCC)   . Seizures [facility])     Patient Active Problem List   Diagnosis Date Noted  . Major depressive disorder, recurrent episode with mixed features (HCC) 06/03/2017  . Suicidal ideation 06/02/2017    Past Surgical History:  Procedure Laterality Date  . ADENOIDECTOMY    . TONSILLECTOMY      Prior to Admission medications   Medication Sig Start Date End Date Taking? Authorizing Provider  lamoTRIgine (LAMICTAL) 25 MG tablet Take 2 tablets (50 mg total) by  mouth daily. Start after initial prescription titration 06/05/17 06/05/18  McNew, Ileene Hutchinson, MD  lamoTRIgine (LAMICTAL) 25 MG tablet Take 1 tablet (25 mg total) by mouth daily. Take 25 mg daily (1 tablet) for 2 weeks then increase to 50 mg daily (2 tablets) 06/06/17   McNew, Ileene Hutchinson, MD  traZODone (DESYREL) 100 MG tablet Take 1 tablet (100 mg total) by mouth at bedtime. 06/05/17   McNew, Ileene Hutchinson, MD    Allergies Bee venom; Cinnamon; Contrast media [iodinated diagnostic agents]; Motrin [ibuprofen]; Nutmeg oil (myristica oil); Penicillins; Benadryl [diphenhydramine hcl]; and Topamax [topiramate]  No family history on file.  Social History Social History   Tobacco Use  . Smoking status: Current Some Day Smoker  . Smokeless tobacco: Never Used  Substance Use Topics  . Alcohol use: No  . Drug use: No    Review of Systems Constitutional: No fever/chills Eyes: No visual changes. ENT: No sore throat. Cardiovascular: Denies chest pain. Respiratory: Denies shortness of breath. Gastrointestinal: Abdominal pain with nausea and vomiting as described above Genitourinary: Negative for dysuria. Musculoskeletal: Negative for neck pain.  Negative for back pain. Integumentary: Negative for rash. Neurological: Negative for headaches, focal weakness or numbness.   ____________________________________________   PHYSICAL EXAM:  VITAL SIGNS: ED Triage Vitals  Enc Vitals Group     BP 07/03/17 2304 (!) 144/94     Pulse Rate 07/03/17 2304 (!) 114     Resp 07/03/17 2304 (!) 23  Temp 07/03/17 2304 98.2 F (36.8 C)     Temp Source 07/03/17 2304 Oral     SpO2 07/03/17 2304 98 %     Weight 07/03/17 2302 (!) 161 kg (355 lb)     Height --      Head Circumference --      Peak Flow --      Pain Score 07/03/17 2302 10     Pain Loc --      Pain Edu? --      Excl. in GC? --     Constitutional: Alert and oriented.  Generally well-appearing and does not appear to be in any acute distress Eyes:  Conjunctivae are normal.  Head: Atraumatic. Nose: No congestion/rhinnorhea. Mouth/Throat: Mucous membranes are moist. Neck: No stridor.  No meningeal signs.   Cardiovascular: Normal rate, regular rhythm. Good peripheral circulation. Grossly normal heart sounds. Respiratory: Normal respiratory effort.  No retractions. Lungs CTAB. Gastrointestinal: Obese.  Soft and nontender to palpation throughout, although she stated that when I was pressing on her abdomen and made her nauseated again.  Negative Murphy sign, no tenderness to palpation at McBurney's point.  No rebound and no guarding. Musculoskeletal: No lower extremity tenderness nor edema. No gross deformities of extremities. Neurologic:  Normal speech and language. No gross focal neurologic deficits are appreciated.  Skin:  Skin is warm, dry and intact. No rash noted. Psychiatric: Mood and affect are somewhat flat and blunted although she did laugh a couple times during our interaction when I was trying to cheer her up.  ____________________________________________   LABS (all labs ordered are listed, but only abnormal results are displayed)  Labs Reviewed  COMPREHENSIVE METABOLIC PANEL - Abnormal; Notable for the following components:      Result Value   Glucose, Bld 153 (*)    All other components within normal limits  CBC - Abnormal; Notable for the following components:   WBC 20.0 (*)    RBC 5.26 (*)    All other components within normal limits  LIPASE, BLOOD   ____________________________________________  EKG  ED ECG REPORT I, Loleta Rose, the attending physician, personally viewed and interpreted this ECG.  Date: 07/03/2017 EKG Time: 23:04 Rate: 119 Rhythm: Sinus tachycardia QRS Axis: normal Intervals: normal ST/T Wave abnormalities: Non-specific ST segment / T-wave changes, but no evidence of acute ischemia. Narrative Interpretation: no evidence of acute ischemia.  Artifact is present throughout the EKG but it is  still able to be analyzed and there is no indication of STEMI.    ____________________________________________  RADIOLOGY   ED MD interpretation: Ordered acute abdomen series but patient refused and eloped  Official radiology report(s): No results found.  ____________________________________________   PROCEDURES  Critical Care performed: No   Procedure(s) performed:   Procedures   ____________________________________________   INITIAL IMPRESSION / ASSESSMENT AND PLAN / ED COURSE  As part of my medical decision making, I reviewed the following data within the electronic MEDICAL RECORD NUMBER History obtained from family, Nursing notes reviewed and incorporated, Labs reviewed , Old chart reviewed and Notes from prior ED visits    Differential diagnosis includes, but is not limited to, acute gastritis, biliary colic, SBO/ileus, cyclic vomiting syndrome, renal colic.  I have seen the patient in the past, about 3 months ago, for similar symptoms and ordered an ultrasound at that time and there was no evidence of cholelithiasis.  She seems to suffer from episodic gastritis but does not take any PPI or other medication to help  with acid reflux.  I discussed her prior work-up and symptoms tonight and how they relate to prior episodes.  I explained that I did not think it would be a good idea to repeat an ultrasound since it would be very unlikely for her to develop gallstones over the last 3 months.  I explained that I will treat her with medication for her refractory nausea as well as obtain an acute abdomen series of radiographs to look for any dilated loops of bowels or other evidence of ileus or obstruction.  She and her mother are comfortable with that plan.  I have ordered Haldol 2 mg IV for the refractory nausea.  She has an allergy to Benadryl or I would give a small dose of Benadryl to help with the nausea as well as avoiding side effects from the Haldol.     Clinical Course as of  Jul 04 449  Fri Jul 04, 2017  0006 After discussing the plan with the patient and her mother and we seem to be on the same page, about 10 minutes later I happened to be walking by the room and saw the patient was walking down the hall towards exit.  I asked what was going on and she informed me she was going home.  I asked why and she refused to answer and Walking but her mother replied that the patient did not like the way that I told her that I was going to do the same thing as before.  I explained that I was not, in fact, doing the same thing as before, that I explained that a repeat ultrasound would not be helpful and that was why I was going to get an acute abdomen series of x-rays to make sure she did not have any dilated loops of bowel or any indication of obstruction, as well as medication to help with her discomfort and nausea.  Even after explaining this to the patient did not turn around and Ambulating in no apparent discomfort towards the exit.  I explained it is certainly her right to leave but I would like to proceed with the medication and imaging as planned.  The mother turned around and walked out as well.  They left AGAINST MEDICAL ADVICE and did not wait for any kind of paperwork or provide any additional opportunities for me to discuss alternative plans of care.   [CF]    Clinical Course User Index [CF] Loleta Rose, MD    ____________________________________________  FINAL CLINICAL IMPRESSION(S) / ED DIAGNOSES  Final diagnoses:  Generalized abdominal pain  Nausea and vomiting, intractability of vomiting not specified, unspecified vomiting type     MEDICATIONS GIVEN DURING THIS VISIT:  Medications  ondansetron (ZOFRAN) injection 4 mg (4 mg Intravenous Given 07/03/17 2349)     ED Discharge Orders    None       Note:  This document was prepared using Dragon voice recognition software and may include unintentional dictation errors.    Loleta Rose, MD 07/04/17  [id number]    Loleta Rose, MD 07/04/17 [phone]

## 2017-10-26 ENCOUNTER — Emergency Department
Admission: EM | Admit: 2017-10-26 | Discharge: 2017-10-26 | Disposition: A | Discharge status: Home / Self Care | Payer: Medicaid Other | Attending: Emergency Medicine | Admitting: Emergency Medicine

## 2017-10-26 ENCOUNTER — Emergency Department: Payer: Medicaid Other

## 2017-10-26 ENCOUNTER — Encounter: Payer: Self-pay | Admitting: Physician Assistant

## 2017-10-26 ENCOUNTER — Other Ambulatory Visit: Payer: Self-pay

## 2017-10-26 DIAGNOSIS — Y999 Unspecified external cause status: Secondary | ICD-10-CM | POA: Diagnosis not present

## 2017-10-26 DIAGNOSIS — Y92513 Shop (commercial) as the place of occurrence of the external cause: Secondary | ICD-10-CM | POA: Diagnosis not present

## 2017-10-26 DIAGNOSIS — F17200 Nicotine dependence, unspecified, uncomplicated: Secondary | ICD-10-CM | POA: Diagnosis not present

## 2017-10-26 DIAGNOSIS — S3992XA Unspecified injury of lower back, initial encounter: Secondary | ICD-10-CM | POA: Diagnosis present

## 2017-10-26 DIAGNOSIS — J45909 Unspecified asthma, uncomplicated: Secondary | ICD-10-CM | POA: Insufficient documentation

## 2017-10-26 DIAGNOSIS — W100XXA Fall (on)(from) escalator, initial encounter: Secondary | ICD-10-CM | POA: Insufficient documentation

## 2017-10-26 DIAGNOSIS — M5432 Sciatica, left side: Secondary | ICD-10-CM | POA: Insufficient documentation

## 2017-10-26 DIAGNOSIS — S39012A Strain of muscle, fascia and tendon of lower back, initial encounter: Secondary | ICD-10-CM | POA: Diagnosis not present

## 2017-10-26 DIAGNOSIS — Z79899 Other long term (current) drug therapy: Secondary | ICD-10-CM | POA: Diagnosis not present

## 2017-10-26 DIAGNOSIS — Y939 Activity, unspecified: Secondary | ICD-10-CM | POA: Diagnosis not present

## 2017-10-26 LAB — POC URINE PREG, ED: Preg Test, Ur: NEGATIVE

## 2017-10-26 MED ORDER — PREDNISONE 20 MG PO TABS
60.0000 mg | ORAL_TABLET | Freq: Once | ORAL | Status: AC
  Administered 2017-10-26: 60 mg via ORAL
  Filled 2017-10-26: qty 3

## 2017-10-26 MED ORDER — PREDNISONE 20 MG PO TABS: 20.0000 mg | ORAL_TABLET | Freq: Every day | ORAL | 0 refills | Status: AC

## 2017-10-26 MED ORDER — ORPHENADRINE CITRATE 30 MG/ML IJ SOLN
60.0000 mg | INTRAMUSCULAR | Status: AC
  Administered 2017-10-26: 60 mg via INTRAMUSCULAR
  Filled 2017-10-26: qty 2

## 2017-10-26 MED ORDER — CYCLOBENZAPRINE HCL 10 MG PO TABS: 10.0000 mg | ORAL_TABLET | Freq: Three times a day (TID) | ORAL | 0 refills | Status: AC | PRN

## 2017-10-26 NOTE — ED Provider Notes (Signed)
[facility] Emergency Department Provider Note ____________________________________________  Time seen: 1735  I have reviewed the triage vital signs and the nursing notes.  HISTORY  Chief Complaint  Back Pain  HPI Sabrina Blevins is a 22 y.o. female presents to the ED for evaluation of continued low back pain with left lower extremity referral pain after mechanical fall yesterday.  Patient's [place] the [place], when she stepped on an escalator that malfunction.  She describes the escalator stalled out after she stepped onto it.  This caused her to lurch forward, falling on her left leg, on several steps before she was finally able to catch herself.  She denies any head injury, loss of consciousness, or lacerations.  She was able to get up, and did not have any immediate symptoms of disability.  She reports that pain increased as the day went on.  She presents today after taking 650 mg of Tylenol today.  She denies any urinary incontinence, saddle anesthesia, leg weakness, or footdrop.  She denies denies any other pain at this time.  She denies any history of chronic ongoing back pain.  Past Medical History:  Diagnosis Date  . Airborne allergy, current reaction   . Asthma   . Bipolar disorder (HCC)   . Cholecystitis   . Morbid obesity (HCC)   . Seizures [facility])     Patient Active Problem List   Diagnosis Date Noted  . Major depressive disorder, recurrent episode with mixed features (HCC) 06/03/2017  . Suicidal ideation 06/02/2017    Past Surgical History:  Procedure Laterality Date  . ADENOIDECTOMY    . TONSILLECTOMY      Prior to Admission medications   Medication Sig Start Date End Date Taking? Authorizing Provider  cyclobenzaprine (FLEXERIL) 10 MG tablet Take 1 tablet (10 mg total) by mouth 3 (three) times daily as needed for up to 5 days for muscle spasms. 10/26/17 10/31/17  Bobbyjoe Pabst, Charlesetta Ivory, PA-C  lamoTRIgine (LAMICTAL) 25 MG tablet Take  2 tablets (50 mg total) by mouth daily. Start after initial prescription titration 06/05/17 06/05/18  McNew, Ileene Hutchinson, MD  lamoTRIgine (LAMICTAL) 25 MG tablet Take 1 tablet (25 mg total) by mouth daily. Take 25 mg daily (1 tablet) for 2 weeks then increase to 50 mg daily (2 tablets) 06/06/17   McNew, Ileene Hutchinson, MD  predniSONE (DELTASONE) 20 MG tablet Take 1 tablet (20 mg total) by mouth daily with breakfast for 5 days. 10/26/17 10/31/17  Lanaya Bennis, Charlesetta Ivory, PA-C  traZODone (DESYREL) 100 MG tablet Take 1 tablet (100 mg total) by mouth at bedtime. 06/05/17   McNew, Ileene Hutchinson, MD    Allergies Bee venom; Cinnamon; Contrast media [iodinated diagnostic agents]; Motrin [ibuprofen]; Nutmeg oil (myristica oil); Penicillins; Benadryl [diphenhydramine hcl]; and Topamax [topiramate]  No family history on file.  Social History Social History   Tobacco Use  . Smoking status: Current Some Day Smoker  . Smokeless tobacco: Never Used  Substance Use Topics  . Alcohol use: No  . Drug use: No    Review of Systems  Constitutional: Negative for fever. Eyes: Negative for visual changes. ENT: Negative for sore throat. Cardiovascular: Negative for chest pain. Respiratory: Negative for shortness of breath. Gastrointestinal: Negative for abdominal pain, vomiting and diarrhea. Genitourinary: Negative for dysuria. Musculoskeletal: Positive for back pain. Skin: Negative for rash. Neurological: Negative for headaches, focal weakness or numbness. ____________________________________________  PHYSICAL EXAM:  VITAL SIGNS: ED Triage Vitals  Enc Vitals Group  BP 10/26/17 1706 (!) 169/90     Pulse Rate 10/26/17 1706 (!) 110     Resp 10/26/17 1706 18     Temp 10/26/17 1706 98.2 F (36.8 C)     Temp Source 10/26/17 1706 Oral     SpO2 10/26/17 1706 98 %     Weight 10/26/17 1707 300 lb (136.1 kg)     Height 10/26/17 1707 5\' 8"  (1.727 m)     Head Circumference --      Peak Flow --      Pain Score 10/26/17 1706  7     Pain Loc --      Pain Edu? --      Excl. in GC? --     Constitutional: Alert and oriented. Well appearing and in no distress. Head: Normocephalic and atraumatic. Eyes: Conjunctivae are normal. Normal extraocular movements Neck: Supple. Normal ROM. No distracting midline tenderness is noted. Cardiovascular: Normal rate, regular rhythm. Normal distal pulses. Respiratory: Normal respiratory effort. No wheezes/rales/rhonchi. Gastrointestinal: Obese, soft and nontender. No distention. Normal bowel sounds noted. No CVA tenderness. Musculoskeletal: Patient without significant tenderness to palpation along the midline of the lumbar spine.  She is able to transition from supine to sit.  She is able to demonstrate in normal hip flexion extension range.  Knee flexion and extension without laxity is also appreciated.  No significant popliteal space fullness.  Patient without any palpable calf or Achilles tenderness on exam.  Nontender with normal range of motion in all extremities.  Neurologic: Negative seated straight leg raise bilaterally.  Normal toe dorsiflexion foot eversion.  Patient is able to toe raise and heel raise on command without deficits.  Negative Trendelenburg.  Normal LE DTRs bilaterally.  Normal gait without ataxia. Normal speech and language. No gross focal neurologic deficits are appreciated. Skin:  Skin is warm, dry and intact. No rash noted. Psychiatric: Mood and affect are normal. Patient exhibits appropriate insight and judgment. ____________________________________________   LABS (pertinent positives/negatives) Labs Reviewed  POC URINE PREG, ED  ____________________________________________   RADIOLOGY  Lumbar Spine IMPRESSION: Negative. ____________________________________________  PROCEDURES  Procedures Prednisone 60 mg PO Norflex 60 mg IM ____________________________________________  INITIAL IMPRESSION / ASSESSMENT AND PLAN / ED COURSE  Patient with ED  evaluation of low back pain and left lower extremity pain following a mechanical injury yesterday.  Patient describes a near fall on a defective escalator.  She describes lurching forward but was able to right herself after falling down a couple of the steps.  She has a reassuring exam from a neurological standpoint.  Her x-rays also negative showing no acute fracture or dislocation.  Patient did elect represent a lumbar strain with some mild sciatic nerve irritation.  She will be discharged with prescriptions for prednisone and Flexeril dose as directed.  She will follow-up with her primary care provider for ongoing symptom management.  Return precautions have been reviewed. ____________________________________________  FINAL CLINICAL IMPRESSION(S) / ED DIAGNOSES  Final diagnoses:  Strain of lumbar region, initial encounter  Sciatica of left side      Lissa Hoard, PA-C 10/26/17 1913    Jeanmarie Plant, MD 10/26/17 2003

## 2017-10-26 NOTE — Discharge Instructions (Addendum)
Your exam and x-rays are negative for any acute fracture or dislocation to the lower back. Your symptoms are consistent with a likely sciatic nerve irritation from the lumbar muscle strain. Take the prescription meds as directed. Apply ice or moist heat to any sore muscles. Follow-up with [facility] or your provider for ongoing symptoms.

## 2017-10-26 NOTE — ED Triage Notes (Signed)
Pt arrives via POV from home with complaints of left-sided lower back pain that radiates down your left leg to her knee s/p injury at [facility]. She reports being on the escalator when it suddenly stopped, jerking her forward. Pt states she stumbled down several steps before finally catching herself.   Pain with walking and sitting for prolonged periods. Denies any urinary symptoms.

## 2017-12-23 ENCOUNTER — Emergency Department: Payer: Medicaid Other

## 2017-12-23 ENCOUNTER — Encounter: Payer: Self-pay | Admitting: Emergency Medicine

## 2017-12-23 ENCOUNTER — Emergency Department
Admission: EM | Admit: 2017-12-23 | Discharge: 2017-12-23 | Disposition: A | Discharge status: Home / Self Care | Payer: Medicaid Other | Attending: Emergency Medicine | Admitting: Emergency Medicine

## 2017-12-23 ENCOUNTER — Other Ambulatory Visit: Payer: Self-pay

## 2017-12-23 DIAGNOSIS — W19XXXA Unspecified fall, initial encounter: Secondary | ICD-10-CM

## 2017-12-23 DIAGNOSIS — M25571 Pain in right ankle and joints of right foot: Secondary | ICD-10-CM | POA: Diagnosis present

## 2017-12-23 DIAGNOSIS — W208XXA Other cause of strike by thrown, projected or falling object, initial encounter: Secondary | ICD-10-CM | POA: Insufficient documentation

## 2017-12-23 HISTORY — DX: Attention-deficit hyperactivity disorder, unspecified type: F90.9

## 2017-12-23 HISTORY — DX: Bipolar disorder, unspecified: F31.9

## 2017-12-23 MED ORDER — MELOXICAM 15 MG PO TABS: 15.0000 mg | ORAL_TABLET | Freq: Every day | ORAL | 1 refills | Status: AC

## 2017-12-23 NOTE — ED Notes (Signed)
See triage note  Presents with pain to right foot and ankle  States she slipped and dropped TV  Landed on foot

## 2017-12-23 NOTE — ED Provider Notes (Signed)
[facility] Emergency Department Provider Note  ____________________________________________  Time seen: Approximately 5:09 PM  I have reviewed the triage vital signs and the nursing notes.   HISTORY  Chief Complaint Fall    HPI Sabrina Blevins is a 22 y.o. female presents to the emergency department with right lateral ankle pain after patient reports that she accidentally dropped a television onto her right foot and inverted foot during the process.  Patient denies prior right ankle sprains.  No numbness or tingling in the right foot.  No lacerations or abrasions.  Patient has been able to ambulate with some difficulty. No alleviating measures have been attempted.    Past Medical History:  Diagnosis Date  . ADHD   . Bipolar 1 disorder (HCC)   . Seizures (HCC)     There are no active problems to display for this patient.   History reviewed. No pertinent surgical history.  Prior to Admission medications   Medication Sig Start Date End Date Taking? Authorizing Provider  meloxicam (MOBIC) 15 MG tablet Take 1 tablet (15 mg total) by mouth daily for 7 days. 12/23/17 12/30/17  Orvil Feil, PA-C    Allergies Cinnamon and Penicillins  No family history on file.  Social History Social History   Tobacco Use  . Smoking status: Never Smoker  . Smokeless tobacco: Never Used  Substance Use Topics  . Alcohol use: Never    Frequency: Never  . Drug use: Never     Review of Systems  Constitutional: No fever/chills Eyes: No visual changes. No discharge ENT: No upper respiratory complaints. Cardiovascular: no chest pain. Respiratory: no cough. No SOB. Gastrointestinal: No abdominal pain.  No nausea, no vomiting.  No diarrhea.  No constipation. Genitourinary: Negative for dysuria. No hematuria Musculoskeletal: Patient has right foot and ankle pain.  Skin: Negative for rash, abrasions, lacerations, ecchymosis. Neurological: Negative for headaches, focal  weakness or numbness.  ____________________________________________   PHYSICAL EXAM:  VITAL SIGNS: ED Triage Vitals  Enc Vitals Group     BP 12/23/17 1539 131/66     Pulse Rate 12/23/17 1539 100     Resp 12/23/17 1539 16     Temp 12/23/17 1539 98.1 F (36.7 C)     Temp Source 12/23/17 1539 Oral     SpO2 12/23/17 1539 99 %     Weight 12/23/17 1540 300 lb (136.1 kg)     Height 12/23/17 1540 5\' 8"  (1.727 m)     Head Circumference --      Peak Flow --      Pain Score 12/23/17 1540 8     Pain Loc --      Pain Edu? --      Excl. in GC? --      Constitutional: Alert and oriented. Well appearing and in no acute distress. Eyes: Conjunctivae are normal. PERRL. EOMI. Head: Atraumatic. Cardiovascular: Normal rate, regular rhythm. Normal S1 and S2.  Good peripheral circulation. Respiratory: Normal respiratory effort without tachypnea or retractions. Lungs CTAB. Good air entry to the bases with no decreased or absent breath sounds. Musculoskeletal: Patient is able to perform limited range of motion at the right ankle, likely secondary to pain.  Pain is elicited with palpation of the anterior and posterior talofibular ligaments but not the metatarsals.  No pain over the course of the right fibula.  Palpable dorsalis pedis pulse, right. Neurologic:  Normal speech and language. No gross focal neurologic deficits are appreciated.  Skin:  Skin is warm,  dry and intact. No rash noted. Psychiatric: Mood and affect are normal. Speech and behavior are normal. Patient exhibits appropriate insight and judgement.   ____________________________________________   LABS (all labs ordered are listed, but only abnormal results are displayed)  Labs Reviewed - No data to display ____________________________________________  EKG   ____________________________________________  RADIOLOGY I personally viewed and evaluated these images as part of my medical decision making, as well as reviewing the  written report by the radiologist  Dg Ankle Complete Right  Result Date: 12/23/2017 CLINICAL DATA:  Right ankle pain after fall. EXAM: RIGHT ANKLE - COMPLETE 3+ VIEW COMPARISON:  None. FINDINGS: There is no evidence of fracture, dislocation, or joint effusion. There is no evidence of arthropathy or other focal bone abnormality. Soft tissues are unremarkable. IMPRESSION: Negative. Electronically Signed   By: Lupita Raider, M.D.   On: 12/23/2017 16:13   Dg Foot Complete Right  Result Date: 12/23/2017 CLINICAL DATA:  Right foot pain after fall. EXAM: RIGHT FOOT COMPLETE - 3+ VIEW COMPARISON:  None. FINDINGS: There is no evidence of fracture or dislocation. There is no evidence of arthropathy or other focal bone abnormality. Soft tissues are unremarkable. IMPRESSION: Negative. Electronically Signed   By: Lupita Raider, M.D.   On: 12/23/2017 16:15    ____________________________________________    PROCEDURES  Procedure(s) performed:    Procedures    Medications - No data to display   ____________________________________________   INITIAL IMPRESSION / ASSESSMENT AND PLAN / ED COURSE  Pertinent labs & imaging results that were available during my care of the patient were reviewed by me and considered in my medical decision making (see chart for details).  Review of the [place] CSRS was performed in accordance of the [employer] prior to dispensing any controlled drugs.      Assessment and Plan: Right ankle pain Patient presents to the emergency department with right lateral ankle pain after sustaining an inversion type ankle injury earlier today after dropping a television on her right foot and inverting right ankle.  X-ray examination of the right foot and ankle revealed no acute bony abnormality.  An ace wrap was applied in the emergency department and patient declined crutches.  She was discharged with meloxicam and advised to follow-up with podiatry as  needed.    ____________________________________________  FINAL CLINICAL IMPRESSION(S) / ED DIAGNOSES  Final diagnoses:  Fall, initial encounter      NEW MEDICATIONS STARTED DURING THIS VISIT:  ED Discharge Orders         Ordered    meloxicam (MOBIC) 15 MG tablet  Daily     12/23/17 1705              This chart was dictated using voice recognition software/Dragon. Despite best efforts to proofread, errors can occur which can change the meaning. Any change was purely unintentional.    Orvil Feil, PA-C 12/23/17 1715    Minna Antis, MD 12/23/17 [phone]

## 2017-12-23 NOTE — ED Triage Notes (Signed)
Patient reports she was moving a tv when she slipped and fell, and the tv landed on her right foot. Patient reports pain with movement. Good pedal pulses noted.  No obvious deformity.

## 2017-12-24 ENCOUNTER — Encounter: Payer: Self-pay | Admitting: Physician Assistant

## 2017-12-25 ENCOUNTER — Emergency Department
Admission: EM | Admit: 2017-12-25 | Discharge: 2017-12-25 | Discharge status: Against Medical Advice | Payer: Medicaid Other | Attending: Emergency Medicine | Admitting: Emergency Medicine

## 2017-12-25 ENCOUNTER — Encounter: Payer: Self-pay | Admitting: Emergency Medicine

## 2017-12-25 ENCOUNTER — Other Ambulatory Visit: Payer: Self-pay

## 2017-12-25 DIAGNOSIS — Z5321 Procedure and treatment not carried out due to patient leaving prior to being seen by health care provider: Secondary | ICD-10-CM | POA: Insufficient documentation

## 2017-12-25 DIAGNOSIS — R55 Syncope and collapse: Secondary | ICD-10-CM | POA: Diagnosis present

## 2017-12-25 LAB — CBC
HCT: 42.8 % (ref 36.0–46.0)
HEMOGLOBIN: 14.2 g/dL (ref 12.0–15.0)
MCH: 27.8 pg (ref 26.0–34.0)
MCHC: 33.2 g/dL (ref 30.0–36.0)
MCV: 83.9 fL (ref 80.0–100.0)
Platelets: 312 10*3/uL (ref 150–400)
RBC: 5.1 MIL/uL (ref 3.87–5.11)
RDW: 13.4 % (ref 11.5–15.5)
WBC: 11.6 10*3/uL — AB (ref 4.0–10.5)
nRBC: 0 % (ref 0.0–0.2)

## 2017-12-25 LAB — BASIC METABOLIC PANEL
ANION GAP: 11 (ref 5–15)
BUN: 11 mg/dL (ref 6–20)
CO2: 24 mmol/L (ref 22–32)
Calcium: 9.1 mg/dL (ref 8.9–10.3)
Chloride: 104 mmol/L (ref 98–111)
Creatinine, Ser: 0.75 mg/dL (ref 0.44–1.00)
GFR calc Af Amer: >60 mL/min (ref >60)
Glucose, Bld: 212 mg/dL — ABNORMAL HIGH (ref 70–99)
POTASSIUM: 4.2 mmol/L (ref 3.5–5.1)
Sodium: 139 mmol/L (ref 135–145)

## 2017-12-25 NOTE — ED Notes (Signed)
First Nurse Note: Pt here via ems and per report from EMS Pt was at home with mother and friend when they got into an argument and they left, pt reports she got into the shower and possibly passed out or had a seizure. Pt has hx of seizures with last one being 8-64mo ago and last time she took meds were 1 year ago. Pt has history of biploar, depression, migraines and seizures. EMS VS: 142/76, 97hr, 97RA, 180 CBG.

## 2017-12-25 NOTE — ED Notes (Signed)
Pt attempting to leave with IV in arm, pt was stopped and states that she does not want to wait anymore. Pt advised to stay for MD. Pt declined. AMA form signed.

## 2017-12-25 NOTE — ED Triage Notes (Signed)
PT arrived with mother with concerns over loss of consciousness/possible seizure that happened today. Mother states she came home to find pt laying in shower unresponsive. PT arrives to ED alert and oriented x 4. Pt reports she remembers an argument prior to episode and getting in shower "to relax" but no recollection after. Pt reports headache. PT reports seizure hx but no current medication regimen. Pt reports last seizure was 8 months prior.

## 2017-12-29 ENCOUNTER — Telehealth: Payer: Self-pay | Admitting: Emergency Medicine

## 2017-12-29 NOTE — Telephone Encounter (Signed)
Called patient due to lwot to inquire about condition and follow up plans. Was busy.

## 2018-01-02 ENCOUNTER — Emergency Department: Payer: Medicaid Other

## 2018-01-02 ENCOUNTER — Emergency Department
Admission: EM | Admit: 2018-01-02 | Discharge: 2018-01-03 | Disposition: A | Discharge status: Home / Self Care | Payer: Medicaid Other | Attending: Student in an Organized Health Care Education/Training Program | Admitting: Student in an Organized Health Care Education/Training Program

## 2018-01-02 ENCOUNTER — Other Ambulatory Visit: Payer: Self-pay

## 2018-01-02 DIAGNOSIS — J45909 Unspecified asthma, uncomplicated: Secondary | ICD-10-CM | POA: Diagnosis not present

## 2018-01-02 DIAGNOSIS — Z79899 Other long term (current) drug therapy: Secondary | ICD-10-CM | POA: Diagnosis not present

## 2018-01-02 DIAGNOSIS — R1030 Lower abdominal pain, unspecified: Secondary | ICD-10-CM | POA: Diagnosis not present

## 2018-01-02 DIAGNOSIS — R112 Nausea with vomiting, unspecified: Secondary | ICD-10-CM | POA: Diagnosis not present

## 2018-01-02 LAB — CBC
HEMATOCRIT: 42.7 % (ref 36.0–46.0)
Hemoglobin: 14.1 g/dL (ref 12.0–15.0)
MCH: 28 pg (ref 26.0–34.0)
MCHC: 33 g/dL (ref 30.0–36.0)
MCV: 84.7 fL (ref 80.0–100.0)
Platelets: 341 10*3/uL (ref 150–400)
RBC: 5.04 MIL/uL (ref 3.87–5.11)
RDW: 13.5 % (ref 11.5–15.5)
WBC: 15.7 10*3/uL — AB (ref 4.0–10.5)
nRBC: 0 % (ref 0.0–0.2)

## 2018-01-02 LAB — COMPREHENSIVE METABOLIC PANEL
ALT: 36 U/L (ref 0–44)
AST: 37 U/L (ref 15–41)
Albumin: 3.9 g/dL (ref 3.5–5.0)
Alkaline Phosphatase: 89 U/L (ref 38–126)
Anion gap: 13 (ref 5–15)
BUN: 13 mg/dL (ref 6–20)
CHLORIDE: 102 mmol/L (ref 98–111)
CO2: 22 mmol/L (ref 22–32)
CREATININE: 0.76 mg/dL (ref 0.44–1.00)
Calcium: 8.8 mg/dL — ABNORMAL LOW (ref 8.9–10.3)
GFR calc Af Amer: >60 mL/min (ref >60)
GLUCOSE: 220 mg/dL — AB (ref 70–99)
Potassium: 4 mmol/L (ref 3.5–5.1)
Sodium: 137 mmol/L (ref 135–145)
Total Bilirubin: 0.7 mg/dL (ref 0.3–1.2)
Total Protein: 7.2 g/dL (ref 6.5–8.1)

## 2018-01-02 LAB — URINALYSIS, COMPLETE (UACMP) WITH MICROSCOPIC
Bilirubin Urine: NEGATIVE
Glucose, UA: NEGATIVE mg/dL
Ketones, ur: 5 mg/dL — AB
Leukocytes, UA: NEGATIVE
NITRITE: NEGATIVE
PROTEIN: NEGATIVE mg/dL
SPECIFIC GRAVITY, URINE: 1.018 (ref 1.005–1.030)
pH: 5 (ref 5.0–8.0)

## 2018-01-02 LAB — LIPASE, BLOOD: LIPASE: 30 U/L (ref 11–51)

## 2018-01-02 LAB — POCT PREGNANCY, URINE: PREG TEST UR: NEGATIVE

## 2018-01-02 MED ORDER — MORPHINE SULFATE (PF) 4 MG/ML IV SOLN
4.0000 mg | INTRAVENOUS | Status: DC | PRN
  Administered 2018-01-02: 4 mg via INTRAVENOUS
  Filled 2018-01-02: qty 1

## 2018-01-02 MED ORDER — PROMETHAZINE HCL 25 MG/ML IJ SOLN
12.5000 mg | Freq: Four times a day (QID) | INTRAMUSCULAR | Status: DC | PRN
  Administered 2018-01-02: 12.5 mg via INTRAVENOUS
  Filled 2018-01-02: qty 1

## 2018-01-02 NOTE — ED Provider Notes (Signed)
[facility] Emergency Department Provider Note    First MD Initiated Contact with Patient 01/02/18 2212     (approximate)  I have reviewed the triage vital signs and the nursing notes.   HISTORY  Chief Complaint Abdominal Pain and Emesis    HPI Sabrina Blevins is a 22 y.o. female past medical history as listed below and morbid obesity presents to the ER with suprapubic abdominal pain nausea and vomiting x2 days.  Is not been able to keep any food down.  States she is never had pain like this before.  Pain is bilateral lower quadrants.  Denies any epigastric pain.  No back pain.  Denies any diarrhea or constipation though has a history of constipation.  States the pain is mild to moderate.    Past Medical History:  Diagnosis Date  . ADHD   . Airborne allergy, current reaction   . Asthma   . Bipolar 1 disorder (HCC)   . Bipolar disorder (HCC)   . Cholecystitis   . Morbid obesity (HCC)   . Seizures (HCC)    No family history on file. Past Surgical History:  Procedure Laterality Date  . ADENOIDECTOMY    . TONSILLECTOMY     Patient Active Problem List   Diagnosis Date Noted  . Major depressive disorder, recurrent episode with mixed features (HCC) 06/03/2017  . Suicidal ideation 06/02/2017      Prior to Admission medications   Medication Sig Start Date End Date Taking? Authorizing Provider  lamoTRIgine (LAMICTAL) 25 MG tablet Take 2 tablets (50 mg total) by mouth daily. Start after initial prescription titration 06/05/17 06/05/18  McNew, Ileene Hutchinson, MD  lamoTRIgine (LAMICTAL) 25 MG tablet Take 1 tablet (25 mg total) by mouth daily. Take 25 mg daily (1 tablet) for 2 weeks then increase to 50 mg daily (2 tablets) 06/06/17   McNew, Ileene Hutchinson, MD  traZODone (DESYREL) 100 MG tablet Take 1 tablet (100 mg total) by mouth at bedtime. 06/05/17   McNew, Ileene Hutchinson, MD    Allergies Bee venom; Cinnamon; Cinnamon; Contrast media [iodinated diagnostic agents];  Motrin [ibuprofen]; Nutmeg oil (myristica oil); Penicillins; Penicillins; Benadryl [diphenhydramine hcl]; and Topamax [topiramate]    Social History Social History   Tobacco Use  . Smoking status: Never Smoker  . Smokeless tobacco: Never Used  Substance Use Topics  . Alcohol use: Never    Frequency: Never  . Drug use: Never    Review of Systems Patient denies headaches, rhinorrhea, blurry vision, numbness, shortness of breath, chest pain, edema, cough, abdominal pain, nausea, vomiting, diarrhea, dysuria, fevers, rashes or hallucinations unless otherwise stated above in HPI. ____________________________________________   PHYSICAL EXAM:  VITAL SIGNS: Vitals:   01/02/18 2146 01/02/18 2257  BP: 131/60 126/84  Pulse: 97 94  Resp: 18 18  Temp: 98.3 F (36.8 C)   SpO2: 98% 98%    Constitutional: Alert and oriented.  Eyes: Conjunctivae are normal.  Head: Atraumatic. Nose: No congestion/rhinnorhea. Mouth/Throat: Mucous membranes are moist.   Neck: No stridor. Painless ROM.  Cardiovascular: Normal rate, regular rhythm. Grossly normal heart sounds.  Good peripheral circulation. Respiratory: Normal respiratory effort.  No retractions. Lungs CTAB. Gastrointestinal: Soft and nontender but exam limited 2/2 supermorbid obesity. No distention. No abdominal bruits. No CVA tenderness. Genitourinary: deferred Musculoskeletal: No lower extremity tenderness nor edema.  No joint effusions. Neurologic:  Normal speech and language. No gross focal neurologic deficits are appreciated. No facial droop Skin:  Skin is warm, dry and  intact. No rash noted. Psychiatric: Mood and affect are normal. Speech and behavior are normal.  ____________________________________________   LABS (all labs ordered are listed, but only abnormal results are displayed)  Results for orders placed or performed during the hospital encounter of 01/02/18 (from the past 24 hour(s))  Lipase, blood     Status: None    Collection Time: 01/02/18 10:26 PM  Result Value Ref Range   Lipase 30 11 - 51 U/L  Comprehensive metabolic panel     Status: Abnormal   Collection Time: 01/02/18 10:26 PM  Result Value Ref Range   Sodium 137 135 - 145 mmol/L   Potassium 4.0 3.5 - 5.1 mmol/L   Chloride 102 98 - 111 mmol/L   CO2 22 22 - 32 mmol/L   Glucose, Bld 220 (H) 70 - 99 mg/dL   BUN 13 6 - 20 mg/dL   Creatinine, Ser 1.61 0.44 - 1.00 mg/dL   Calcium 8.8 (L) 8.9 - 10.3 mg/dL   Total Protein 7.2 6.5 - 8.1 g/dL   Albumin 3.9 3.5 - 5.0 g/dL   AST 37 15 - 41 U/L   ALT 36 0 - 44 U/L   Alkaline Phosphatase 89 38 - 126 U/L   Total Bilirubin 0.7 0.3 - 1.2 mg/dL   GFR calc non Af Amer >60 >60 mL/min   GFR calc Af Amer >60 >60 mL/min   Anion gap 13 5 - 15  CBC     Status: Abnormal   Collection Time: 01/02/18 10:26 PM  Result Value Ref Range   WBC 15.7 (H) 4.0 - 10.5 K/uL   RBC 5.04 3.87 - 5.11 MIL/uL   Hemoglobin 14.1 12.0 - 15.0 g/dL   HCT 09.6 04.5 - 40.9 %   MCV 84.7 80.0 - 100.0 fL   MCH 28.0 26.0 - 34.0 pg   MCHC 33.0 30.0 - 36.0 g/dL   RDW 81.1 91.4 - 78.2 %   Platelets 341 150 - 400 K/uL   nRBC 0.0 0.0 - 0.2 %   ____________________________________________ ________________________________  RADIOLOGY  I personally reviewed all radiographic images ordered to evaluate for the above acute complaints and reviewed radiology reports and findings.  These findings were personally discussed with the patient.  Please see medical record for radiology report.  ____________________________________________   PROCEDURES  Procedure(s) performed:  Procedures    Critical Care performed: no ____________________________________________   INITIAL IMPRESSION / ASSESSMENT AND PLAN / ED COURSE  Pertinent labs & imaging results that were available during my care of the patient were reviewed by me and considered in my medical decision making (see chart for details).   DDX: Cystitis, colitis, diverticulitis,  constipation, ovarian cyst, hernia, appendicitis doubt torsion  Sabrina Blevins is a 22 y.o. who presents to the ED with symptoms as described above.  Patient is nontoxic-appearing but given her location and morbid obesity limiting exam of the believe that she will require a CT imaging to exclude more insidious process.  Denies any pelvic symptoms such as discharge or bleeding.  Denies any chance of being pregnant.  Will give IV fluids as of IV pain medication.  Clinical Course as of Jan 02 2302  Caleen Essex Jan 02, 2018  2246 Patient up ambulating without any distress.  Does have mild leukocytosis.  Patient be signed out to oncoming physician pending CT imaging and reassessment.   [PR]    Clinical Course User Index [PR] Willy Eddy, MD     As part of my medical  decision making, I reviewed the following data within the electronic MEDICAL RECORD NUMBER Nursing notes reviewed and incorporated, Labs reviewed, notes from prior ED visits.   ____________________________________________   FINAL CLINICAL IMPRESSION(S) / ED DIAGNOSES  Final diagnoses:  Non-intractable vomiting with nausea, unspecified vomiting type  Lower abdominal pain      NEW MEDICATIONS STARTED DURING THIS VISIT:  New Prescriptions   No medications on file     Note:  This document was prepared using Dragon voice recognition software and may include unintentional dictation errors.    Willy Eddyobinson, Kalon Erhardt, MD 01/02/18 [phone]

## 2018-01-02 NOTE — Discharge Instructions (Addendum)

## 2018-01-02 NOTE — ED Notes (Signed)
Patient transported to CT 

## 2018-01-02 NOTE — ED Triage Notes (Signed)
Pt arrives to ED via POV with c/o abdominal pain and N/V x2 days. Pt reports bilateral lower abdominal pain with radiation into the lower back. Pt denies fever, reports 5 episodes of emesis over the last 24 hrs.

## 2018-01-03 MED ORDER — ONDANSETRON 4 MG PO TBDP: 4.0000 mg | ORAL_TABLET | Freq: Three times a day (TID) | ORAL | 0 refills | Status: DC | PRN

## 2018-01-03 MED ORDER — DICYCLOMINE HCL 20 MG PO TABS: 20.0000 mg | ORAL_TABLET | Freq: Four times a day (QID) | ORAL | 0 refills | Status: DC | PRN

## 2018-01-03 NOTE — ED Provider Notes (Signed)
-----------------------------------------   1:10 AM on 01/03/2018 -----------------------------------------  CT abdomen pelvis interpreted per Dr. Allena KatzPatel: 1. No acute abdominal or pelvic pathology.  Updated patient on CT imaging result.  She is overall feeling better.  Tolerated liquids without emesis.  Strict return precautions given.  Patient verbalizes understanding and agrees with plan of care.   Irean HongSung, Rayley J, MD 01/03/18 [phone]

## 2018-02-01 ENCOUNTER — Encounter: Payer: Self-pay | Admitting: Emergency Medicine

## 2018-02-01 ENCOUNTER — Emergency Department
Admission: EM | Admit: 2018-02-01 | Discharge: 2018-02-02 | Disposition: A | Discharge status: Home / Self Care | Payer: Medicaid Other | Attending: Emergency Medicine | Admitting: Emergency Medicine

## 2018-02-01 ENCOUNTER — Other Ambulatory Visit: Payer: Self-pay

## 2018-02-01 DIAGNOSIS — R102 Pelvic and perineal pain: Secondary | ICD-10-CM | POA: Diagnosis present

## 2018-02-01 DIAGNOSIS — R112 Nausea with vomiting, unspecified: Secondary | ICD-10-CM | POA: Insufficient documentation

## 2018-02-01 DIAGNOSIS — J45909 Unspecified asthma, uncomplicated: Secondary | ICD-10-CM | POA: Insufficient documentation

## 2018-02-01 LAB — COMPREHENSIVE METABOLIC PANEL
ALBUMIN: 4.2 g/dL (ref 3.5–5.0)
ALK PHOS: 85 U/L (ref 38–126)
ALT: 72 U/L — ABNORMAL HIGH (ref 0–44)
AST: 43 U/L — AB (ref 15–41)
Anion gap: 8 (ref 5–15)
BILIRUBIN TOTAL: 0.5 mg/dL (ref 0.3–1.2)
BUN: 10 mg/dL (ref 6–20)
CALCIUM: 9.2 mg/dL (ref 8.9–10.3)
CO2: 26 mmol/L (ref 22–32)
Chloride: 103 mmol/L (ref 98–111)
Creatinine, Ser: 0.92 mg/dL (ref 0.44–1.00)
GFR calc Af Amer: >60 mL/min (ref >60)
GLUCOSE: 194 mg/dL — AB (ref 70–99)
POTASSIUM: 3.7 mmol/L (ref 3.5–5.1)
Sodium: 137 mmol/L (ref 135–145)
Total Protein: 7.7 g/dL (ref 6.5–8.1)

## 2018-02-01 LAB — CBC
HCT: 44.4 % (ref 36.0–46.0)
Hemoglobin: 14.5 g/dL (ref 12.0–15.0)
MCH: 27.3 pg (ref 26.0–34.0)
MCHC: 32.7 g/dL (ref 30.0–36.0)
MCV: 83.6 fL (ref 80.0–100.0)
PLATELETS: 340 10*3/uL (ref 150–400)
RBC: 5.31 MIL/uL — ABNORMAL HIGH (ref 3.87–5.11)
RDW: 13.1 % (ref 11.5–15.5)
WBC: 17.3 10*3/uL — AB (ref 4.0–10.5)
nRBC: 0 % (ref 0.0–0.2)

## 2018-02-01 LAB — URINALYSIS, COMPLETE (UACMP) WITH MICROSCOPIC
BILIRUBIN URINE: NEGATIVE
GLUCOSE, UA: NEGATIVE mg/dL
HGB URINE DIPSTICK: NEGATIVE
Ketones, ur: NEGATIVE mg/dL
Leukocytes, UA: NEGATIVE
NITRITE: NEGATIVE
PH: 6 (ref 5.0–8.0)
Protein, ur: NEGATIVE mg/dL
SPECIFIC GRAVITY, URINE: 1.017 (ref 1.005–1.030)

## 2018-02-01 LAB — LIPASE, BLOOD: Lipase: 33 U/L (ref 11–51)

## 2018-02-01 NOTE — ED Notes (Signed)
Did POC urine preg. Result was negative. Both triage POC devices unable to scan barcodes. Consulting civil engineerCharge RN notified.

## 2018-02-01 NOTE — ED Triage Notes (Signed)
Pt reports abd cramping with N/V intermittently for over 4 weeks; seen here 01/02/18 for same; has not followed up with anyone since; pt says the cramps are worsening; menstrual cycle due any day; pt says she had intercourse since LMP and the condom broke; there is a chance she is pregnant; reports some diarrhea; pt awake and alert; talking in complete coherent sentences

## 2018-02-02 ENCOUNTER — Emergency Department: Payer: Medicaid Other

## 2018-02-02 LAB — PREGNANCY, URINE: PREG TEST UR: NEGATIVE

## 2018-02-02 LAB — HCG, QUANTITATIVE, PREGNANCY: hCG, Beta Chain, Quant, S: <1 m[IU]/mL (ref <5)

## 2018-02-02 MED ORDER — OXYCODONE-ACETAMINOPHEN 5-325 MG PO TABS
1.0000 | ORAL_TABLET | Freq: Once | ORAL | Status: AC
  Administered 2018-02-02: 1 via ORAL

## 2018-02-02 MED ORDER — MORPHINE SULFATE (PF) 2 MG/ML IV SOLN
2.0000 mg | Freq: Once | INTRAVENOUS | Status: DC
  Filled 2018-02-02: qty 1

## 2018-02-02 MED ORDER — ONDANSETRON HCL 4 MG/2ML IJ SOLN
4.0000 mg | Freq: Once | INTRAMUSCULAR | Status: AC
  Administered 2018-02-02: 4 mg via INTRAVENOUS
  Filled 2018-02-02: qty 2

## 2018-02-02 MED ORDER — OXYCODONE-ACETAMINOPHEN 5-325 MG PO TABS
ORAL_TABLET | ORAL | Status: AC
  Filled 2018-02-02: qty 1

## 2018-02-02 NOTE — ED Provider Notes (Signed)
[facility] Emergency Department Provider Note   First MD Initiated Contact with Patient 02/01/18 2354     (approximate)  I have reviewed the triage vital signs and the nursing notes.   HISTORY  Chief Complaint Abdominal Pain   HPI Sabrina Blevins is a 22 y.o. female with below list of chronic medical conditions presents to the emergency department with 1 month history of pelvic pain that is currently 10 out of 10.  Patient to nausea and vomiting last episode 1 hour ago.  Patient denies any fever.  Patient denies any diarrhea constipation.  Patient denies any urinary symptoms.  Patient does admit to sexual intercourse.   Past Medical History:  Diagnosis Date  . ADHD   . Airborne allergy, current reaction   . Asthma   . Bipolar 1 disorder (HCC)   . Bipolar disorder (HCC)   . Cholecystitis   . Morbid obesity (HCC)   . Seizures [facility])     Patient Active Problem List   Diagnosis Date Noted  . Major depressive disorder, recurrent episode with mixed features (HCC) 06/03/2017  . Suicidal ideation 06/02/2017    Past Surgical History:  Procedure Laterality Date  . ADENOIDECTOMY    . TONSILLECTOMY      Prior to Admission medications   Not on File    Allergies Bee venom; Cinnamon; Cinnamon; Contrast media [iodinated diagnostic agents]; Motrin [ibuprofen]; Nutmeg oil (myristica oil); Penicillins; Penicillins; Benadryl [diphenhydramine hcl]; and Topamax [topiramate]  History reviewed. No pertinent family history.  Social History Social History   Tobacco Use  . Smoking status: Never Smoker  . Smokeless tobacco: Never Used  Substance Use Topics  . Alcohol use: Never    Frequency: Never  . Drug use: Never    Review of Systems Constitutional: No fever/chills Eyes: No visual changes. ENT: No sore throat. Cardiovascular: Denies chest pain. Respiratory: Denies shortness of breath. Gastrointestinal: Positive for abdominal pain nausea and  vomiting..  No diarrhea.  No constipation. Genitourinary: Negative for dysuria. Musculoskeletal: Negative for neck pain.  Negative for back pain. Integumentary: Negative for rash. Neurological: Negative for headaches, focal weakness or numbness.   ____________________________________________   PHYSICAL EXAM:  VITAL SIGNS: ED Triage Vitals  Enc Vitals Group     BP 02/01/18 2054 112/65     Pulse Rate 02/01/18 2054 (!) 103     Resp 02/01/18 2054 18     Temp 02/01/18 2054 98.2 F (36.8 C)     Temp Source 02/01/18 2054 Oral     SpO2 02/01/18 2054 100 %     Weight 02/01/18 2058 136.1 kg (300 lb)     Height 02/01/18 2058 1.727 m (5\' 8" )     Head Circumference --      Peak Flow --      Pain Score 02/01/18 2058 7     Pain Loc --      Pain Edu? --      Excl. in GC? --     Constitutional: Alert and oriented. Well appearing and in no acute distress. Eyes: Conjunctivae are normal. PERRL. EOMI. Mouth/Throat: Mucous membranes are moist.  Oropharynx non-erythematous. Neck: No stridor.   Cardiovascular: Normal rate, regular rhythm. Good peripheral circulation. Grossly normal heart sounds. Respiratory: Normal respiratory effort.  No retractions. Lungs CTAB. Gastrointestinal: Bilateral pelvic pain with palpation.  No distention.  Genitourinary: Not performed at patient's request Musculoskeletal: No lower extremity tenderness nor edema. No gross deformities of extremities. Neurologic:  Normal speech and  language. No gross focal neurologic deficits are appreciated.  Skin:  Skin is warm, dry and intact. No rash noted. Psychiatric: Mood and affect are normal. Speech and behavior are normal.  ____________________________________________   LABS (all labs ordered are listed, but only abnormal results are displayed)  Labs Reviewed  COMPREHENSIVE METABOLIC PANEL - Abnormal; Notable for the following components:      Result Value   Glucose, Bld 194 (*)    AST 43 (*)    ALT 72 (*)    All  other components within normal limits  CBC - Abnormal; Notable for the following components:   WBC 17.3 (*)    RBC 5.31 (*)    All other components within normal limits  URINALYSIS, COMPLETE (UACMP) WITH MICROSCOPIC - Abnormal; Notable for the following components:   Color, Urine YELLOW (*)    APPearance HAZY (*)    Bacteria, UA RARE (*)    All other components within normal limits  LIPASE, BLOOD  PREGNANCY, URINE  HCG, QUANTITATIVE, PREGNANCY    RADIOLOGY I, Panola N Clifton Safley, personally viewed and evaluated these images (plain radiographs) as part of my medical decision making, as well as reviewing the written report by the radiologist.  ED MD interpretation: Unremarkable pelvic ultrasound with nonvisualization of the right ovary per radiologist.  Official radiology report(s): [place] Pelvic Complete With Transvaginal  Result Date: 02/02/2018 CLINICAL DATA:  Pelvic pain. EXAM: TRANSABDOMINAL AND TRANSVAGINAL ULTRASOUND OF PELVIS TECHNIQUE: Both transabdominal and transvaginal ultrasound examinations of the pelvis were performed. Transabdominal technique was performed for global imaging of the pelvis including uterus, ovaries, adnexal regions, and pelvic cul-de-sac. It was necessary to proceed with endovaginal exam following the transabdominal exam to visualize the uterus, ovaries, and adnexa. COMPARISON:  CT 01/02/2018. FINDINGS: Technically challenging and limited exam due to habitus. Uterus Measurements: 6.3 x 2.9 x 2.5 cm = volume: 23.7 mL. No fibroids or other mass visualized. Endometrium Thickness: 4 mm.  No focal abnormality visualized. Right ovary Not visualized.  No adnexal mass. Left ovary Measurements: 2.2 x 1.9 x 1.8 cm = volume: 4 mL. Normal appearance/no adnexal mass. Ovarian blood flow is noted. Other findings No abnormal free fluid. IMPRESSION: Nonvisualization of the right ovary. Otherwise unremarkable pelvic ultrasound. Electronically Signed   By: Narda RutherfordMelanie  Sanford M.D.   On:  02/02/2018 01:55      Procedures   ____________________________________________   INITIAL IMPRESSION / ASSESSMENT AND PLAN / ED COURSE  As part of my medical decision making, I reviewed the following data within the electronic MEDICAL RECORD NUMBER   22 year old female presenting with above-stated history and physical exam secondary to pelvic pain patient was seen in November for the same with negative CT scan performed at that time.  Concern for possible PID and as such recommended pelvic exam to be performed as the patient states that the last time she had a pelvic exam performed a year and a half ago she had findings consistent with cervical motion tenderness.  In addition the patient has had an ectopic pregnancy in the past as well.  Patient also has persistently elevated white blood cell count.  Patient however stated that she did not want to have a pelvic exam performed today and that she would follow-up with OB/GYN to have it performed instead as she prefers to have a female practitioner do her exam.  Unfortunately they were only female physicians in the emergency department at this time and as such patient's request for female practitioner could not be  done.  Spoke with patient at length regarding the necessity of following up with OB/GYN for exam.  CT scan not performed again today as this was done for the same complaint on November 15.  Pelvic ultrasound revealed no acute abnormality ____________________________________________  FINAL CLINICAL IMPRESSION(S) / ED DIAGNOSES  Final diagnoses:  Pelvic pain     MEDICATIONS GIVEN DURING THIS VISIT:  Medications  morphine 2 MG/ML injection 2 mg (has no administration in time range)  ondansetron (ZOFRAN) injection 4 mg (has no administration in time range)     ED Discharge Orders    None       Note:  This document was prepared using Dragon voice recognition software and may include unintentional dictation errors.    Darci Current, MD 02/02/18 [phone]

## 2018-02-02 NOTE — ED Notes (Addendum)
In to rm to set up pelvic exam. Pt refuses pelvic exam and states that she will follow up with her OB-GYN. Manson PasseyBrown, MD notified.

## 2018-02-20 ENCOUNTER — Encounter: Payer: Self-pay | Admitting: Obstetrics and Gynecology

## 2018-02-20 ENCOUNTER — Other Ambulatory Visit (HOSPITAL_COMMUNITY)
Admission: RE | Admit: 2018-02-20 | Discharge: 2018-02-20 | Disposition: A | Discharge status: Home / Self Care | Payer: Medicaid Other | Source: Ambulatory Visit | Attending: Obstetrics and Gynecology | Admitting: Obstetrics and Gynecology

## 2018-02-20 ENCOUNTER — Ambulatory Visit (INDEPENDENT_AMBULATORY_CARE_PROVIDER_SITE_OTHER): Payer: Medicaid Other | Admitting: Obstetrics and Gynecology

## 2018-02-20 VITALS — BP 142/80 | HR 123 | Ht 68.0 in | Wt 324.0 lb

## 2018-02-20 DIAGNOSIS — Z113 Encounter for screening for infections with a predominantly sexual mode of transmission: Secondary | ICD-10-CM | POA: Insufficient documentation

## 2018-02-20 DIAGNOSIS — Z124 Encounter for screening for malignant neoplasm of cervix: Secondary | ICD-10-CM

## 2018-02-20 DIAGNOSIS — Z131 Encounter for screening for diabetes mellitus: Secondary | ICD-10-CM

## 2018-02-20 DIAGNOSIS — N912 Amenorrhea, unspecified: Secondary | ICD-10-CM

## 2018-02-20 DIAGNOSIS — R112 Nausea with vomiting, unspecified: Secondary | ICD-10-CM

## 2018-02-20 MED ORDER — PROMETHAZINE HCL 25 MG PO TABS: 25.0000 mg | ORAL_TABLET | Freq: Four times a day (QID) | ORAL | 2 refills | Status: DC | PRN

## 2018-02-20 NOTE — Progress Notes (Signed)
Patient ID: Sabrina Blevins, female   DOB: 01-02-1996, 23 y.o.   MRN: [id number]  Reason for Consult: Follow-up (ER follow up/ pain is gone, n/v every morning )   Referred by No ref. provider found  Subjective:     HPI:  Sabrina Blevins is a 23 y.o. female. She reports nausea. She believes she is pregnant.   Gynecological History  Patient's last menstrual period was 01/02/2018 (exact date).   History of fibroids, polyps, or ovarian cysts? : no  History of PCOS? no Hstory of Endometriosis? no History of abnormal pap smears? no Have you had any sexually transmitted infections in the past? no    Past Medical History:  Diagnosis Date  . ADHD   . Airborne allergy, current reaction   . Asthma   . Bipolar 1 disorder (HCC)   . Bipolar disorder (HCC)   . Cholecystitis   . Morbid obesity (HCC)   . Seizures (HCC)    History reviewed. No pertinent family history. Past Surgical History:  Procedure Laterality Date  . ADENOIDECTOMY    . TONSILLECTOMY      Short Social History:  Social History   Tobacco Use  . Smoking status: Never Smoker  . Smokeless tobacco: Never Used  Substance Use Topics  . Alcohol use: Never    Allergies  Allergen Reactions  . Bee Venom Anaphylaxis  . Cinnamon Anaphylaxis  . Cinnamon Anaphylaxis  . Contrast Media [Iodinated Diagnostic Agents] Anaphylaxis  . Depakote [Valproic Acid]     Lowers blood glucose quickly   . Motrin [Ibuprofen] Anaphylaxis  . Nutmeg Oil (Myristica Oil) Anaphylaxis  . Penicillins Anaphylaxis    Has patient had a PCN reaction causing immediate rash, facial/tongue/throat swelling, SOB or lightheadedness with hypotension: Yes Has patient had a PCN reaction causing severe rash involving mucus membranes or skin necrosis: No Has patient had a PCN reaction that required hospitalization: Unknown Has patient had a PCN reaction occurring within the last 10 years: Unknown If all of the above answers are "NO", then  may proceed with Cephalosporin use.   Marland Kitchen. Penicillins Anaphylaxis  . Benadryl [Diphenhydramine Hcl] Swelling    Pt states reaction is to GENERIC formula only  . Oxcarbazepine Other (See Comments)    Low bp, "bottoms out"  . Topamax [Topiramate] Other (See Comments)    Drops BP per mother    Current Outpatient Medications  Medication Sig Dispense Refill  . hydrOXYzine (ATARAX/VISTARIL) 50 MG tablet Take 1 tablet (50 mg total) by mouth 3 (three) times daily as needed for anxiety. 90 tablet 1  . lamoTRIgine (LAMICTAL) 25 MG tablet Take 2 tablets (50 mg total) by mouth at bedtime. Until 03/27/2018, 4 tabs for two weeks, then 8 tabs or 200 mg after 70 tablet 1  . prazosin (MINIPRESS) 2 MG capsule Take 1 capsule (2 mg total) by mouth 2 (two) times daily. 60 capsule 1  . traZODone (DESYREL) 100 MG tablet Take 1 tablet (100 mg total) by mouth at bedtime as needed for sleep. 30 tablet 1  . triamcinolone (KENALOG) 0.025 % ointment Apply 1 application topically 2 (two) times daily. For 5 days 30 g 0   No current facility-administered medications for this visit.    REVIEW OF SYSTEMS      Objective:  Objective   Vitals:   02/20/18 0836  BP: (!) 142/80  Pulse: (!) 123  Weight: (!) 324 lb (147 kg)  Height: 5\' 8"  (1.727 m)   Body mass  index is 49.26 kg/m.  Physical Exam  Assessment/Plan:     23 yo with nausea and irregular periods Beta hcg negative, patient is not pregnant.  Phenergan for nausea Follow up for pelvic US Labs and pap smear today.   Follow up in 1 week  Adelene Idler MD [facility] OB/GYN, [facility] 02/20/2018 9:25 AM

## 2018-02-22 LAB — NUSWAB VAGINITIS PLUS (VG+)
CANDIDA GLABRATA, NAA: NEGATIVE
CHLAMYDIA TRACHOMATIS, NAA: NEGATIVE
Candida albicans, NAA: NEGATIVE
Neisseria gonorrhoeae, NAA: NEGATIVE
TRICH VAG BY NAA: NEGATIVE

## 2018-02-24 LAB — CYTOLOGY - PAP
Chlamydia: NEGATIVE
DIAGNOSIS: NEGATIVE
Neisseria Gonorrhea: NEGATIVE
Trichomonas: NEGATIVE

## 2018-02-25 ENCOUNTER — Ambulatory Visit: Payer: Medicaid Other

## 2018-02-25 LAB — HEPATITIS PANEL, ACUTE
HEP B S AG: NEGATIVE
Hep A IgM: NEGATIVE
Hep B C IgM: NEGATIVE
Hep C Virus Ab: <0.1 s/co ratio (ref 0.0–0.9)

## 2018-02-25 LAB — BETA HCG QUANT (REF LAB)

## 2018-02-25 LAB — TSH+PRL+FSH+TESTT+LH+DHEA S...
17-Hydroxyprogesterone: 34 ng/dL
ANDROSTENEDIONE: 103 ng/dL (ref 41–262)
DHEA-SO4: 302.6 ug/dL (ref 110.0–431.7)
FSH: 6.7 m[IU]/mL
LH: 10.7 m[IU]/mL
Prolactin: 8.6 ng/mL (ref 4.8–23.3)
TSH: 2.29 u[IU]/mL (ref 0.450–4.500)
Testosterone, Free: 4.1 pg/mL (ref 0.0–4.2)
Testosterone: 49 ng/dL — ABNORMAL HIGH (ref 8–48)

## 2018-02-25 LAB — RPR: RPR Ser Ql: NONREACTIVE

## 2018-02-25 LAB — HIV ANTIBODY (ROUTINE TESTING W REFLEX): HIV Screen 4th Generation wRfx: NONREACTIVE

## 2018-02-25 LAB — HEMOGLOBIN A1C
Est. average glucose Bld gHb Est-mCnc: 160 mg/dL
Hgb A1c MFr Bld: 7.2 % — ABNORMAL HIGH (ref 4.8–5.6)

## 2018-03-12 ENCOUNTER — Emergency Department
Admission: EM | Admit: 2018-03-12 | Discharge: 2018-03-12 | Disposition: A | Discharge status: Other Acute Inpatient Hospital | Payer: Medicaid Other | Attending: Emergency Medicine | Admitting: Emergency Medicine

## 2018-03-12 ENCOUNTER — Encounter: Payer: Self-pay | Admitting: Emergency Medicine

## 2018-03-12 ENCOUNTER — Inpatient Hospital Stay
Admission: AD | Admit: 2018-03-12 | Discharge: 2018-03-16 | DRG: 885 | Disposition: A | Discharge status: Home / Self Care | Payer: Medicaid Other | Attending: Psychiatry | Admitting: Psychiatry

## 2018-03-12 ENCOUNTER — Other Ambulatory Visit: Payer: Self-pay

## 2018-03-12 DIAGNOSIS — R8279 Other abnormal findings on microbiological examination of urine: Secondary | ICD-10-CM | POA: Diagnosis not present

## 2018-03-12 DIAGNOSIS — F329 Major depressive disorder, single episode, unspecified: Secondary | ICD-10-CM | POA: Insufficient documentation

## 2018-03-12 DIAGNOSIS — R45851 Suicidal ideations: Secondary | ICD-10-CM | POA: Diagnosis not present

## 2018-03-12 DIAGNOSIS — R4587 Impulsiveness: Secondary | ICD-10-CM | POA: Diagnosis present

## 2018-03-12 DIAGNOSIS — N390 Urinary tract infection, site not specified: Secondary | ICD-10-CM | POA: Diagnosis present

## 2018-03-12 DIAGNOSIS — Z880 Allergy status to penicillin: Secondary | ICD-10-CM | POA: Diagnosis not present

## 2018-03-12 DIAGNOSIS — Z9119 Patient's noncompliance with other medical treatment and regimen: Secondary | ICD-10-CM

## 2018-03-12 DIAGNOSIS — F32A Depression, unspecified: Secondary | ICD-10-CM

## 2018-03-12 DIAGNOSIS — J45909 Unspecified asthma, uncomplicated: Secondary | ICD-10-CM | POA: Insufficient documentation

## 2018-03-12 DIAGNOSIS — Z91041 Radiographic dye allergy status: Secondary | ICD-10-CM | POA: Diagnosis not present

## 2018-03-12 DIAGNOSIS — F39 Unspecified mood [affective] disorder: Secondary | ICD-10-CM | POA: Diagnosis present

## 2018-03-12 DIAGNOSIS — Z886 Allergy status to analgesic agent status: Secondary | ICD-10-CM

## 2018-03-12 DIAGNOSIS — Z9102 Food additives allergy status: Secondary | ICD-10-CM | POA: Diagnosis not present

## 2018-03-12 DIAGNOSIS — Z634 Disappearance and death of family member: Secondary | ICD-10-CM | POA: Insufficient documentation

## 2018-03-12 DIAGNOSIS — Z818 Family history of other mental and behavioral disorders: Secondary | ICD-10-CM

## 2018-03-12 DIAGNOSIS — F319 Bipolar disorder, unspecified: Secondary | ICD-10-CM | POA: Diagnosis present

## 2018-03-12 DIAGNOSIS — F909 Attention-deficit hyperactivity disorder, unspecified type: Secondary | ICD-10-CM | POA: Diagnosis not present

## 2018-03-12 DIAGNOSIS — Z915 Personal history of self-harm: Secondary | ICD-10-CM | POA: Insufficient documentation

## 2018-03-12 DIAGNOSIS — F338 Other recurrent depressive disorders: Principal | ICD-10-CM | POA: Diagnosis present

## 2018-03-12 DIAGNOSIS — F4310 Post-traumatic stress disorder, unspecified: Secondary | ICD-10-CM | POA: Diagnosis present

## 2018-03-12 DIAGNOSIS — Z6841 Body Mass Index (BMI) 40.0 and over, adult: Secondary | ICD-10-CM | POA: Diagnosis not present

## 2018-03-12 DIAGNOSIS — Z91199 Patient's noncompliance with other medical treatment and regimen due to unspecified reason: Secondary | ICD-10-CM

## 2018-03-12 DIAGNOSIS — Z3202 Encounter for pregnancy test, result negative: Secondary | ICD-10-CM | POA: Diagnosis not present

## 2018-03-12 DIAGNOSIS — F339 Major depressive disorder, recurrent, unspecified: Secondary | ICD-10-CM | POA: Diagnosis not present

## 2018-03-12 DIAGNOSIS — Z62819 Personal history of unspecified abuse in childhood: Secondary | ICD-10-CM | POA: Diagnosis not present

## 2018-03-12 DIAGNOSIS — Z91030 Bee allergy status: Secondary | ICD-10-CM

## 2018-03-12 DIAGNOSIS — Z9089 Acquired absence of other organs: Secondary | ICD-10-CM

## 2018-03-12 DIAGNOSIS — Z008 Encounter for other general examination: Secondary | ICD-10-CM | POA: Diagnosis present

## 2018-03-12 LAB — URINALYSIS, COMPLETE (UACMP) WITH MICROSCOPIC
Bilirubin Urine: NEGATIVE
GLUCOSE, UA: 150 mg/dL — AB
Ketones, ur: 5 mg/dL — AB
Nitrite: NEGATIVE
Protein, ur: 30 mg/dL — AB
Specific Gravity, Urine: 1.027 (ref 1.005–1.030)
pH: 5 (ref 5.0–8.0)

## 2018-03-12 LAB — COMPREHENSIVE METABOLIC PANEL
ALT: 24 U/L (ref 0–44)
AST: 28 U/L (ref 15–41)
Albumin: 3.8 g/dL (ref 3.5–5.0)
Alkaline Phosphatase: 93 U/L (ref 38–126)
Anion gap: 11 (ref 5–15)
BUN: 13 mg/dL (ref 6–20)
CHLORIDE: 101 mmol/L (ref 98–111)
CO2: 23 mmol/L (ref 22–32)
CREATININE: 0.76 mg/dL (ref 0.44–1.00)
Calcium: 8.6 mg/dL — ABNORMAL LOW (ref 8.9–10.3)
GFR calc Af Amer: >60 mL/min (ref >60)
GFR calc non Af Amer: >60 mL/min (ref >60)
Glucose, Bld: 291 mg/dL — ABNORMAL HIGH (ref 70–99)
Potassium: 3.8 mmol/L (ref 3.5–5.1)
Sodium: 135 mmol/L (ref 135–145)
Total Bilirubin: 0.7 mg/dL (ref 0.3–1.2)
Total Protein: 7.3 g/dL (ref 6.5–8.1)

## 2018-03-12 LAB — CBC
HCT: 43.5 % (ref 36.0–46.0)
Hemoglobin: 14.3 g/dL (ref 12.0–15.0)
MCH: 27.3 pg (ref 26.0–34.0)
MCHC: 32.9 g/dL (ref 30.0–36.0)
MCV: 83.2 fL (ref 80.0–100.0)
Platelets: 316 10*3/uL (ref 150–400)
RBC: 5.23 MIL/uL — AB (ref 3.87–5.11)
RDW: 13.3 % (ref 11.5–15.5)
WBC: 16.8 10*3/uL — ABNORMAL HIGH (ref 4.0–10.5)
nRBC: 0 % (ref 0.0–0.2)

## 2018-03-12 LAB — SALICYLATE LEVEL

## 2018-03-12 LAB — URINE DRUG SCREEN, QUALITATIVE (ARMC ONLY)
Amphetamines, Ur Screen: NOT DETECTED
Barbiturates, Ur Screen: NOT DETECTED
Benzodiazepine, Ur Scrn: NOT DETECTED
Cannabinoid 50 Ng, Ur ~~LOC~~: NOT DETECTED
Cocaine Metabolite,Ur ~~LOC~~: NOT DETECTED
MDMA (Ecstasy)Ur Screen: NOT DETECTED
Methadone Scn, Ur: NOT DETECTED
Opiate, Ur Screen: NOT DETECTED
PHENCYCLIDINE (PCP) UR S: NOT DETECTED
Tricyclic, Ur Screen: NOT DETECTED

## 2018-03-12 LAB — ETHANOL: Alcohol, Ethyl (B): <10 mg/dL (ref <10)

## 2018-03-12 LAB — PREGNANCY, URINE: Preg Test, Ur: NEGATIVE

## 2018-03-12 LAB — ACETAMINOPHEN LEVEL: Acetaminophen (Tylenol), Serum: <10 ug/mL — ABNORMAL LOW (ref 10–30)

## 2018-03-12 MED ORDER — MAGNESIUM HYDROXIDE 400 MG/5ML PO SUSP: 30.0000 mL | Freq: Every day | ORAL | Status: DC | PRN

## 2018-03-12 MED ORDER — ALUM & MAG HYDROXIDE-SIMETH 200-200-20 MG/5ML PO SUSP: 30.0000 mL | ORAL | Status: DC | PRN

## 2018-03-12 MED ORDER — TRAZODONE HCL 100 MG PO TABS: 100.0000 mg | ORAL_TABLET | Freq: Every evening | ORAL | Status: DC | PRN

## 2018-03-12 MED ORDER — HYDROXYZINE HCL 50 MG PO TABS
50.0000 mg | ORAL_TABLET | Freq: Three times a day (TID) | ORAL | Status: DC | PRN
  Administered 2018-03-14 – 2018-03-15 (×2): 50 mg via ORAL
  Filled 2018-03-12 (×2): qty 1

## 2018-03-12 MED ORDER — ACETAMINOPHEN 325 MG PO TABS: 650.0000 mg | ORAL_TABLET | Freq: Four times a day (QID) | ORAL | Status: DC | PRN
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
## 2018-03-12 NOTE — BH Assessment (Signed)
TTS received a call from [facility] Shank at 1410 stating Sabrina S. was directed to present to the ED from their crisis unit due to SI with intent and a plan.  There is reportedly a history of attempts in 2018 and 2017.  There was no reported HI, AVH, or AOD use at the time report was received.  Caller reported patient experienced loss of a relationship in the past 2 days and a recent decrease in self-care activities - staying in bed with exception of medical care appointments.  There is a history of self-injury.  Caller reported patient was prescribed trazadone and lamictal. [facility] reported plan to IVC if patient did not report by 14:20.   [facility] called back at 14:27 to ensure patient reported as directed.  TTS confirmed same. Patient was in triage at the time.    Starla Link Kathalene Frames, PhD, [facility], LCAS 03/12/2018 3:33 PM

## 2018-03-12 NOTE — Tx Team (Signed)
Initial Treatment Plan 03/12/2018 9:00 PM [name] [id number]:[id number]    PATIENT STRESSORS: Financial difficulties Health problems Occupational concerns   PATIENT STRENGTHS: Average or above average intelligence Capable of independent living General fund of knowledge Motivation for treatment/growth   PATIENT IDENTIFIED PROBLEMS: Suicide Ideations    Depression/Anxiety     Body image disturbance              DISCHARGE CRITERIA:  Adequate post-discharge living arrangements Improved stabilization in mood, thinking, and/or behavior Medical problems require only outpatient monitoring Need for constant or close observation no longer present  PRELIMINARY DISCHARGE PLAN: Attend PHP/IOP Participate in family therapy Return to previous living arrangement  PATIENT/FAMILY INVOLVEMENT: This treatment plan has been presented to and reviewed with the patient, Sabrina Blevins,   The patient  have been given the opportunity to ask questions and make suggestions.  Lelan Pons, RN 03/12/2018, 9:00 PM

## 2018-03-12 NOTE — ED Notes (Signed)
Pt transferred to [facility] via wheelchair with officer and NT. She is stable with NAD. Pt belongings given at transfer. Report given to [facility] RN. No issues.
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
## 2018-03-12 NOTE — ED Notes (Signed)
BEHAVIORAL HEALTH ROUNDING Patient sleeping: No. Patient alert and oriented: yes Behavior appropriate: Yes.  ; If no, describe:  Nutrition and fluids offered: yes Toileting and hygiene offered: Yes  Sitter present: q15 minute observations and security  monitoring Law enforcement present: Yes  ODS  

## 2018-03-12 NOTE — ED Triage Notes (Signed)
Pt here with mom sent by crisis center for depression and SI. Pt reports thought about overdosing on medication.

## 2018-03-12 NOTE — ED Notes (Signed)
Pt given meal tray and a sprite.  

## 2018-03-12 NOTE — ED Notes (Signed)
IVC/ Consult completed/ Pending Admit to BMU  
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
## 2018-03-12 NOTE — Consult Note (Signed)
  Psychiatry: Chart reviewed.  Case discussed with TTS.  Patient appears to be appropriate for admission to the psychiatry ward.  Orders will be placed.
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
## 2018-03-12 NOTE — ED Notes (Signed)
Pt dressed out into appropriate behavioral health clothing. Pt belongings consist of black shoes, white pajama pants with colorful dots, a tie dye shirt and blue panties.

## 2018-03-12 NOTE — ED Notes (Signed)
Pt transferred BMU via wheelchair with Officer and NT. She is stable with NAD. Pt belongings given at transfer. Report given to [facility] RN. No issues.

## 2018-03-12 NOTE — ED Notes (Signed)

## 2018-03-13 ENCOUNTER — Encounter: Payer: Self-pay | Admitting: Psychiatry

## 2018-03-13 DIAGNOSIS — Z91199 Patient's noncompliance with other medical treatment and regimen due to unspecified reason: Secondary | ICD-10-CM

## 2018-03-13 DIAGNOSIS — Z9119 Patient's noncompliance with other medical treatment and regimen: Secondary | ICD-10-CM

## 2018-03-13 DIAGNOSIS — F339 Major depressive disorder, recurrent, unspecified: Secondary | ICD-10-CM

## 2018-03-13 DIAGNOSIS — N390 Urinary tract infection, site not specified: Secondary | ICD-10-CM | POA: Diagnosis present

## 2018-03-13 DIAGNOSIS — F4310 Post-traumatic stress disorder, unspecified: Secondary | ICD-10-CM | POA: Diagnosis present

## 2018-03-13 LAB — LIPID PANEL
Cholesterol: 172 mg/dL (ref 0–200)
HDL: 37 mg/dL — ABNORMAL LOW (ref >40)
LDL Cholesterol: 60 mg/dL (ref 0–99)
Total CHOL/HDL Ratio: 4.6 RATIO
Triglycerides: 377 mg/dL — ABNORMAL HIGH (ref <150)
VLDL: 75 mg/dL — ABNORMAL HIGH (ref 0–40)

## 2018-03-13 LAB — HEMOGLOBIN A1C
Hgb A1c MFr Bld: 7.2 % — ABNORMAL HIGH (ref 4.8–5.6)
MEAN PLASMA GLUCOSE: 159.94 mg/dL

## 2018-03-13 LAB — TSH: TSH: 2.645 u[IU]/mL (ref 0.350–4.500)

## 2018-03-13 MED ORDER — PRAZOSIN HCL 1 MG PO CAPS
1.0000 mg | ORAL_CAPSULE | ORAL | Status: AC
  Administered 2018-03-13: 1 mg via ORAL
  Filled 2018-03-13 (×2): qty 1

## 2018-03-13 MED ORDER — PRAZOSIN HCL 2 MG PO CAPS
2.0000 mg | ORAL_CAPSULE | Freq: Two times a day (BID) | ORAL | Status: DC
  Administered 2018-03-14 – 2018-03-16 (×5): 2 mg via ORAL
  Filled 2018-03-13 (×5): qty 1

## 2018-03-13 MED ORDER — LAMOTRIGINE 25 MG PO TABS
50.0000 mg | ORAL_TABLET | Freq: Every day | ORAL | Status: DC
  Administered 2018-03-13 – 2018-03-15 (×3): 50 mg via ORAL
  Filled 2018-03-13 (×3): qty 2

## 2018-03-13 NOTE — Plan of Care (Signed)
More visible in the milieu, cooperative and compliant with treatment

## 2018-03-13 NOTE — [facility] Group Notes (Signed)
LCSW Group Therapy Note  03/13/2018 1:00 PM  Type of Therapy and Topic:  Group Therapy:  Feelings around Relapse and Recovery  Participation Level:  Active   Description of Group:    Patients in this group will discuss emotions they experience before and after a relapse. They will process how experiencing these feelings, or avoidance of experiencing them, relates to having a relapse. Facilitator will guide patients to explore emotions they have related to recovery. Patients will be encouraged to process which emotions are more powerful. They will be guided to discuss the emotional reaction significant others in their lives may have to their relapse or recovery. Patients will be assisted in exploring ways to respond to the emotions of others without this contributing to a relapse.  Therapeutic Goals: 1. Patient will identify two or more emotions that lead to a relapse for them 2. Patient will identify two emotions that result when they relapse 3. Patient will identify two emotions related to recovery 4. Patient will demonstrate ability to communicate their needs through discussion and/or role plays   Summary of Patient Progress: Patient was an active and attentive participant in group.  Patient was supportive of other group members. Patient identified the following as emotions associated with her relapse "depression and anxiety".  She identified the following as being associated with recovery "excitement".  Patient reports that she tends to walk away from situations and isolate when feeling negative emotions, as opposed to addressing them.  Patient was receptive to recommendations from other group members and this clinician.  Therapeutic Modalities:   Cognitive Behavioral Therapy Solution-Focused Therapy Assertiveness Training Relapse Prevention Therapy   Penni Homans, MSW, LCSW 03/13/2018 2:11 PM

## 2018-03-13 NOTE — Progress Notes (Signed)
Inpatient Diabetes Program Recommendations  AACE/ADA: New Consensus Statement on Inpatient Glycemic Control (2015)  Target Ranges:  Prepandial:   less than 140 mg/dL      Peak postprandial:   less than 180 mg/dL (1-2 hours)      Critically ill patients:  140 - 180 mg/dL   Lab Results  Component Value Date   GLUCAP 196 (H) 06/03/2017   HGBA1C 7.2 (H) 03/12/2018  Results for Sabrina Blevins, Sabrina Blevins (MRN [id number]) as of 03/13/2018 09:01  Ref. Range 03/12/2018 14:42  Glucose Latest Ref Range: 70 - 99 mg/dL 569 (H)   Diabetes history: None noted Current orders for Inpatient glycemic control:  None  Inpatient Diabetes Program Recommendations:    Note elevated A1C and glucose.  It appears that lab glucoses have been increased during past encounters.  ? Is this a new diagnosis.  If so patient will need outpatient PCP follow-up and possibly oral DM medications?  Likely not a good time for in depth education, however patient will need follow-up.   Thanks,  Beryl Meager, RN, BC-ADM Inpatient Diabetes Coordinator Pager [phone] (8a-5p)
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
## 2018-03-13 NOTE — Progress Notes (Signed)
Recreation Therapy Notes  Date:03/13/2018  Time:9:30 am  Location:Craft room  Behavioral response:N/A  Intervention Topic:Team work  Discussion/Intervention: Patient did not attend group.  Clinical Observations/Feedback:  Patient did not attend group.  [name] LRT/CTRS          Sabrina Blevins 03/13/2018 10:23 AM 
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
## 2018-03-13 NOTE — BHH Counselor (Signed)
CSW did follow up with the patient's peer support Brayton Caves at [facility] to inform that appointments were not able to be set up and per the patient she is not pregnant.  Penni Homans, MSW, LCSW 03/13/2018 4:18 PM

## 2018-03-13 NOTE — Plan of Care (Signed)
Pt. Denies si/hi/avh, can contract for safety. Pt. Complaint with medications and unit procedures. Pt. Participation with unit activites and groups no present. Pt. Mostly isolative and withdrawn today. Does attends meals though.   Problem: Activity: Goal: Interest or engagement in leisure activities will improve Outcome: Not Progressing   Problem: Coping: Goal: Will verbalize feelings Outcome: Progressing   Problem: Health Behavior/Discharge Planning: Goal: Compliance with therapeutic regimen will improve Outcome: Progressing

## 2018-03-13 NOTE — Tx Team (Addendum)
Interdisciplinary Treatment and Diagnostic Plan Update  03/13/2018 Time of Session: 11:00AM  Sabrina Blevins MRN: [id number]  Principal Diagnosis: Major depressive disorder, recurrent episode with mixed features (HCC)  Secondary Diagnoses: Principal Problem:   Major depressive disorder, recurrent episode with mixed features (HCC) Active Problems:   Suicidal ideation   Current Medications:  Current Facility-Administered Medications  Medication Dose Route Frequency Provider Last Rate Last Dose  . acetaminophen (TYLENOL) tablet 650 mg  650 mg Oral Q6H PRN Clapacs, John T, MD      . alum & mag hydroxide-simeth (MAALOX/MYLANTA) 200-200-20 MG/5ML suspension 30 mL  30 mL Oral Q4H PRN Clapacs, John T, MD      . hydrOXYzine (ATARAX/VISTARIL) tablet 50 mg  50 mg Oral TID PRN Clapacs, John T, MD      . magnesium hydroxide (MILK OF MAGNESIA) suspension 30 mL  30 mL Oral Daily PRN Clapacs, John T, MD      . traZODone (DESYREL) tablet 100 mg  100 mg Oral QHS PRN Clapacs, Jackquline DenmarkJohn T, MD       PTA Medications: Medications Prior to Admission  Medication Sig Dispense Refill Last Dose  . promethazine (PHENERGAN) 25 MG tablet Take 1 tablet (25 mg total) by mouth every 6 (six) hours as needed for nausea or vomiting. 30 tablet 2     Patient Stressors: Financial difficulties Health problems Occupational concerns  Patient Strengths: Average or above average intelligence Capable of independent living General fund of knowledge Motivation for treatment/growth  Treatment Modalities: Medication Management, Group therapy, Case management,  1 to 1 session with clinician, Psychoeducation, Recreational therapy.   Physician Treatment Plan for Primary Diagnosis: Major depressive disorder, recurrent episode with mixed features (HCC) Long Term Goal(s):     Short Term Goals:    Medication Management: Evaluate patient's response, side effects, and tolerance of medication regimen.  Therapeutic  Interventions: 1 to 1 sessions, Unit Group sessions and Medication administration.  Evaluation of Outcomes: Progressing  Physician Treatment Plan for Secondary Diagnosis: Principal Problem:   Major depressive disorder, recurrent episode with mixed features (HCC) Active Problems:   Suicidal ideation  Long Term Goal(s):     Short Term Goals:       Medication Management: Evaluate patient's response, side effects, and tolerance of medication regimen.  Therapeutic Interventions: 1 to 1 sessions, Unit Group sessions and Medication administration.  Evaluation of Outcomes: Progressing   RN Treatment Plan for Primary Diagnosis: Major depressive disorder, recurrent episode with mixed features (HCC) Long Term Goal(s): Knowledge of disease and therapeutic regimen to maintain health will improve  Short Term Goals: Ability to verbalize feelings will improve, Ability to disclose and discuss suicidal ideas, Ability to identify and develop effective coping behaviors will improve and Compliance with prescribed medications will improve  Medication Management: RN will administer medications as ordered by provider, will assess and evaluate patient's response and provide education to patient for prescribed medication. RN will report any adverse and/or side effects to prescribing provider.  Therapeutic Interventions: 1 on 1 counseling sessions, Psychoeducation, Medication administration, Evaluate responses to treatment, Monitor vital signs and CBGs as ordered, Perform/monitor CIWA, COWS, AIMS and Fall Risk screenings as ordered, Perform wound care treatments as ordered.  Evaluation of Outcomes: Progressing   LCSW Treatment Plan for Primary Diagnosis: Major depressive disorder, recurrent episode with mixed features (HCC) Long Term Goal(s): Safe transition to appropriate next level of care at discharge, Engage patient in therapeutic group addressing interpersonal concerns.  Short Term Goals: Engage patient  in  aftercare planning with referrals and resources, Increase social support, Increase ability to appropriately verbalize feelings, Increase emotional regulation and Facilitate acceptance of mental health diagnosis and concerns  Therapeutic Interventions: Assess for all discharge needs, 1 to 1 time with Social worker, Explore available resources and support systems, Assess for adequacy in community support network, Educate family and significant other(s) on suicide prevention, Complete Psychosocial Assessment, Interpersonal group therapy.  Evaluation of Outcomes: Progressing   Progress in Treatment: Attending groups: No. Participating in groups: No. Taking medication as prescribed: No. Pt not prescribed at this time.  Toleration medication: No. Family/Significant other contact made: Yes, individual(s) contacted:  SPE completed with pt.  She decliend any family contact at this time.  Patient understands diagnosis: Yes. Discussing patient identified problems/goals with staff: Yes. Medical problems stabilized or resolved: Yes. Denies suicidal/homicidal ideation: Yes. Issues/concerns per patient self-inventory: No. Other: none  New problem(s) identified: No, Describe:  none  New Short Term/Long Term Goal(s): medication management for mood stabilization; elimination of SI thoughts; development of comprehensive mental wellness/sobriety plan.  Patient Goals:  "Getting my medicines stabilized."  Discharge Plan or Barriers: Patient reports that she plans to follow up with her current provider, [facility] with peer support services and outpatient therapy.  Patient reports that she would like to begin medication management if at all possible.  Patient reports that she plans on returning to her parents home.   Reason for Continuation of Hospitalization: Anxiety Depression Medication stabilization Suicidal ideation  Estimated Length of Stay:  Recreational Therapy: Patient Stressors: Relationship,  Death, Being cyber bullied Patient Goal: Patient will identify 3 healthy leisure activities they can access in the community post d/c within 5 recreation therapy group sessions  Attendees: Patient: [name] 03/13/2018 12:36 PM  Physician: Dr. Jennet MaduroPucilowska 03/13/2018 12:36 PM  Nursing:  03/13/2018 12:36 PM  RN Care Manager: 03/13/2018 12:36 PM  Social Worker: Penni HomansMichaela Stanfield, LCSW 03/13/2018 12:36 PM  Recreational Therapist: Hilbert BibleShay Stacie Knutzen, CTRS, LRT 03/13/2018 12:36 PM  Other:  03/13/2018 12:36 PM  Other:  03/13/2018 12:36 PM  Other: 03/13/2018 12:36 PM    Scribe for Treatment Team: Harden MoMichaela J Stanfield, LCSW 03/13/2018 12:36 PM

## 2018-03-13 NOTE — [facility] Counselor (Signed)
CSW attempted to call the [facility] the patient's provider to schedule the patient with aftercare appointments.  CSW was put on hold several times today as she called multiple times to try and get the appointments scheduled however was unable to do so.    [facility]  Penni HomansMichaela Ladislaus Repsher, MSW, LCSW 03/13/2018 4:17 PM

## 2018-03-13 NOTE — Progress Notes (Signed)
Recreation Therapy Notes  INPATIENT RECREATION THERAPY ASSESSMENT  Patient Details Name: Sabrina SimmerJade Danielle Friddle MRN: [id number] DOB: Jul 05, 1995 Today's Date: 03/13/2018       Information Obtained From: Patient  Able to Participate in Assessment/Interview: Yes  Patient Presentation: Responsive  Reason for Admission (Per Patient): Active Symptoms, Med Non-Compliance  Patient Stressors: Relationship, Death, Other (Comment)(Being cyber bullied)  Coping Skills:   Music, Other (Comment)(Walk dog)  Leisure Interests (2+):  Programmer, applications(Volunteer at [employer])  Frequency of Recreation/Participation: Chief Executive OfficerMonthly  Awareness of Community Resources:  Yes  Community Resources:  [employer]  Current Use:    If no, Barriers?:    Expressed Interest in [employer] CorporationCommunity Resource Information:    [place] of [place]idence:  [place]  Patient Main Form of Transportation: Other (Comment)(My mom)  Patient Strengths:  Conservation officer, historic buildingsolite and out going  Patient Identified Areas of Improvement:  Take my medication and learn new coping skills and be more social  Patient Goal for Hospitalization:  To get stable and leave  Current SI (including self-harm):  No  Current HI:  No  Current AVH: No  Staff Intervention Plan: Group Attendance, Collaborate with Interdisciplinary Treatment Team  Consent to Intern Participation: N/A  Tavari Loadholt 03/13/2018, 2:45 PM

## 2018-03-13 NOTE — Progress Notes (Signed)
D: Pt denies SI/HI/AVH, can contract for safety. Pt. Is pleasant and cooperative, but can be minimal at times. Pt. Endorses depression and anxiety at, "9/10" and "7/10" respectively. Pt. Is goal motivated. Pt. Completed self-inventory. No behavioral concerns to report.   A: Q x 15 minute observation checks were completed for safety. Patient was provided with education. Patient was given/offered medications per orders. Patient  was encourage to attend groups, participate in unit activities and continue with plan of care. Pt. Chart and plans of care reviewed. Pt. Given support and encouragement.   R: Patient is complaint with medications, but groups and unit activities attendance is poor. Pt. Mostly isolative and withdrawn.             Precautionary checks every 15 minutes for safety maintained, room free of safety hazards, patient sustains no injury or falls during this shift. Will endorse care to next shift.

## 2018-03-13 NOTE — [facility] Suicide Risk Assessment (Signed)
[facility] INPATIENT:  Family/Significant Other Suicide Prevention Education  Suicide Prevention Education:  Patient Refusal for Family/Significant Other Suicide Prevention Education: The patient Sabrina Blevins has refused to provide written consent for family/significant other to be provided Family/Significant Other Suicide Prevention Education during admission and/or prior to discharge.  Physician notified.  SPE completed with pt, as pt refused to consent to family contact. SPI pamphlet provided to pt and pt was encouraged to share information with support network, ask questions, and talk about any concerns relating to SPE. Pt denies access to guns/firearms and verbalized understanding of information provided. Mobile Crisis information also provided to pt.   [name] 03/13/2018, 10:05 AM

## 2018-03-13 NOTE — [facility] Suicide Risk Assessment (Signed)
Sabrina Blevins   Nursing information obtained from:  Patient Demographic factors:  Caucasian Current Mental Status:  Plan to harm others Loss Factors:  Financial problems / change in socioeconomic status Historical Factors:  Impulsivity Risk Reduction Factors:  Positive therapeutic relationship  Total Time spent with patient: 1 hour Principal Problem: Major depressive disorder, recurrent episode with mixed features (HCC) Diagnosis:  Principal Problem:   Major depressive disorder, recurrent episode with mixed features (HCC) Active Problems:   Suicidal ideation  Subjective Data: suicidal ideation  Continued Clinical Symptoms:  Alcohol Use Disorder Identification Test Final Score (AUDIT): 2 The "Alcohol Use Disorders Identification Test", Guidelines for Use in Primary Care, Second Edition.  World Science writerHealth Organization [employer] [facility](WHO). Score between 0-7:  no or low risk or alcohol related problems. Score between 8-15:  moderate risk of alcohol related problems. Score between 16-19:  high risk of alcohol related problems. Score 20 or above:  warrants further diagnostic evaluation for alcohol dependence and treatment.   CLINICAL FACTORS:   Bipolar Disorder:   Depressive phase   Musculoskeletal: Strength & Muscle Tone: within normal limits Gait & Station: normal Patient leans: N/A  Psychiatric Specialty Exam: Physical Exam  Nursing note and vitals reviewed. Psychiatric: Her affect is blunt. Her speech is delayed. She is withdrawn. Cognition and memory are normal. She expresses impulsivity. She exhibits a depressed mood. She expresses suicidal ideation. She expresses suicidal plans.    Review of Systems  Neurological: Negative.   Psychiatric/Behavioral: Positive for depression and suicidal ideas.  All other systems reviewed and are negative.   Blood pressure (!) 145/82, pulse (!) 102, temperature 98.6 F (37 C), temperature source Oral, resp. rate 18, height 5\' 8"   (1.727 m), weight (!) 147.4 kg, SpO2 100 %.Body mass index is 49.42 kg/m.  General Appearance: Casual  Eye Contact:  Good  Speech:  Clear and Coherent  Volume:  Normal  Mood:  Depressed  Affect:  Blunt  Thought Process:  Goal Directed and Descriptions of Associations: Intact  Orientation:  Full (Time, Place, and Person)  Thought Content:  WDL  Suicidal Thoughts:  Yes.  with intent/plan  Homicidal Thoughts:  No  Memory:  Immediate;   Fair Recent;   Fair Remote;   Fair  Judgement:  Impaired  Insight:  Shallow  Psychomotor Activity:  Psychomotor Retardation  Concentration:  Concentration: Fair and Attention Span: Fair  Recall:  Fis[employer]Fair  Fund of Knowledge:  Fair  Language:  Fair  Akathisia:  No  Handed:  Right  AIMS (if indicated):     Assets:  Communication Skills Desire for Improvement Financial Resources/Insurance Housing Physical Health Resilience Social Support  ADL's:  Intact  Cognition:  WNL  Sleep:  Number of Hours: 5.5      COGNITIVE FEATURES THAT CONTRIBUTE TO RISK:  None    SUICIDE RISK:   Moderate:  Frequent suicidal ideation with limited intensity, and duration, some specificity in terms of plans, no associated intent, good self-control, limited dysphoria/symptomatology, some risk factors present, and identifiable protective factors, including available and accessible social support.  PLAN OF CARE: hospital admission, medication management, discharge planning.  Sabrina Blevins is a 23 year old female with a history of bipolar disorder admitted for suicidal ideation with a plan to overdose on her mother's medication.  #Suicidal ideation -patient is able to contract for safety in the hospital  #Mood -restart Lamictal 50 mg nightly  #Disposition -discharge with family -follow up with [facility]    I certify that inpatient services  furnished can reasonably be expected to improve the patient's condition.   Kristine Linea, MD 03/13/2018, 2:43 PM

## 2018-03-14 LAB — URINE CULTURE

## 2018-03-14 MED ORDER — HYDROCORTISONE 1 % EX CREA
TOPICAL_CREAM | Freq: Two times a day (BID) | CUTANEOUS | Status: DC
  Administered 2018-03-14: 1 via TOPICAL
  Administered 2018-03-15 – 2018-03-16 (×3): via TOPICAL
  Filled 2018-03-14: qty 28

## 2018-03-14 MED ORDER — LORAZEPAM 0.5 MG PO TABS
0.5000 mg | ORAL_TABLET | Freq: Once | ORAL | Status: AC
  Administered 2018-03-14: 0.5 mg via ORAL
  Filled 2018-03-14: qty 1

## 2018-03-14 NOTE — Progress Notes (Signed)
Patient this evening when having her vital signs taken is found to be tachycardic, but denies pain, so call placed to the on call MD to review the patient's presentation. No new orders given at this time, but will recheck vital signs in an hour. Pt. Then proceeds to start visiting with her mom and reports, "I'm having a panic attack" and reports right arm pain. Pt. Assessed and WDL, but still tachycardic. Call placed to on call MD moments later from first call and orders given for medications to be given (see mar). Pt. Given medications and re-evaluated for pain and denies. Pt. Denies any trouble breathing or other abnormalities and proceeds to continue visiting with her mom. Will continue to monitor for safety and comfort of the patient.

## 2018-03-14 NOTE — Progress Notes (Signed)
[facility]  03/14/2018 11:17 AM Sabrina Blevins  MRN:  [id number] Subjective: Follow-up for this patient with major depression.  Patient says her mood is feeling more stable today.  Denies any suicidal ideation.  She says she is trying to stay calm as best she can.  Patient is taking care of her ADLs and is interactive on the unit.  She has a complaint today that the palms of her hands are itching and asks for some kind of treatment for it.  She speculates it is due to an allergy to some kind of lotion.  No rash anywhere else on her body no other new physical complaints. Principal Problem: Major depressive disorder, recurrent episode with mixed features (HCC) Diagnosis: Principal Problem:   Major depressive disorder, recurrent episode with mixed features (HCC) Active Problems:   Suicidal ideation   PTSD (post-traumatic stress disorder)   UTI (urinary tract infection)   Noncompliance with treatment  Total Time spent with patient: 30 minutes  Past Psychiatric History: Patient has a past history of chronic mental health problems with depression and suicidality  Past Medical History:  Past Medical History:  Diagnosis Date  . ADHD   . Airborne allergy, current reaction   . Asthma   . Bipolar 1 disorder (HCC)   . Bipolar disorder (HCC)   . Cholecystitis   . Morbid obesity (HCC)   . Seizures (HCC)     Past Surgical History:  Procedure Laterality Date  . ADENOIDECTOMY    . TONSILLECTOMY     Family History: History reviewed. No pertinent family history. Family Psychiatric  History: See previous note Social History:  Social History   Substance and Sexual Activity  Alcohol Use Never  . Frequency: Never     Social History   Substance and Sexual Activity  Drug Use Never    Social History   Socioeconomic History  . Marital status: Single    Spouse name: Not on file  . Number of children: Not on file  . Years of education: Not on file  . Highest education level:  Not on file  Occupational History  . Not on file  Social Needs  . Financial resource strain: Not on file  . Food insecurity:    Worry: Not on file    Inability: Not on file  . Transportation needs:    Medical: Not on file    Non-medical: Not on file  Tobacco Use  . Smoking status: Never Smoker  . Smokeless tobacco: Never Used  Substance and Sexual Activity  . Alcohol use: Never    Frequency: Never  . Drug use: Never  . Sexual activity: Yes    Birth control/protection: None  Lifestyle  . Physical activity:    Days per week: Not on file    Minutes per session: Not on file  . Stress: Not on file  Relationships  . Social connections:    Talks on phone: Not on file    Gets together: Not on file    Attends religious service: Not on file    Active member of club or organization: Not on file    Attends meetings of clubs or organizations: Not on file    Relationship status: Not on file  Other Topics Concern  . Not on file  Social History Narrative   ** Merged History Encounter **       Additional Social History:  Sleep: Fair  Appetite:  Fair  Current Medications: Current Facility-Administered Medications  Medication Dose Route Frequency Provider Last Rate Last Dose  . acetaminophen (TYLENOL) tablet 650 mg  650 mg Oral Q6H PRN Clapacs, John T, MD      . alum & mag hydroxide-simeth (MAALOX/MYLANTA) 200-200-20 MG/5ML suspension 30 mL  30 mL Oral Q4H PRN Clapacs, John T, MD      . hydrocortisone cream 1 %   Topical BID Clapacs, John T, MD      . hydrOXYzine (ATARAX/VISTARIL) tablet 50 mg  50 mg Oral TID PRN Clapacs, John T, MD      . lamoTRIgine (LAMICTAL) tablet 50 mg  50 mg Oral QHS Pucilowska, Jolanta B, MD   50 mg at 03/13/18 2216  . magnesium hydroxide (MILK OF MAGNESIA) suspension 30 mL  30 mL Oral Daily PRN Clapacs, John T, MD      . prazosin (MINIPRESS) capsule 2 mg  2 mg Oral BID Pucilowska, Jolanta B, MD   2 mg at 03/14/18 0748  .  traZODone (DESYREL) tablet 100 mg  100 mg Oral QHS PRN Clapacs, Jackquline Denmark, MD        Lab Results:  Results for orders placed or performed during the hospital encounter of 03/12/18 (from the past 48 hour(s))  Hemoglobin A1c     Status: Abnormal   Collection Time: 03/12/18  2:42 PM  Result Value Ref Range   Hgb A1c MFr Bld 7.2 (H) 4.8 - 5.6 %    Comment: (NOTE) Pre diabetes:          5.7%-6.4% Diabetes:              >6.4% Glycemic control for   <7.0% adults with diabetes    Mean Plasma Glucose 159.94 mg/dL    Comment: Performed at [facility], [address], [place], [place] [address]  Lipid panel     Status: Abnormal   Collection Time: 03/12/18  2:42 PM  Result Value Ref Range   Cholesterol 172 0 - 200 mg/dL   Triglycerides 709 (H) <150 mg/dL   HDL 37 (L) >62 mg/dL   Total CHOL/HDL Ratio 4.6 RATIO   VLDL 75 (H) 0 - 40 mg/dL   LDL Cholesterol 60 0 - 99 mg/dL    Comment:        Total Cholesterol/HDL:CHD Risk Coronary Heart Disease Risk Table                     Men   Women  1/2 Average Risk   3.4   3.3  Average Risk       5.0   4.4  2 X Average Risk   9.6   7.1  3 X Average Risk  23.4   11.0        Use the calculated Patient Ratio above and the CHD Risk Table to determine the patient's CHD Risk.        ATP III CLASSIFICATION (LDL):  <100     mg/dL   Optimal  836-629  mg/dL   Near or Above                    Optimal  130-159  mg/dL   Borderline  476-546  mg/dL   High  >503     mg/dL   Very High Performed at [facility], [address], [place], [place] [address]   TSH     Status: None  Collection Time: 03/12/18  2:42 PM  Result Value Ref Range   TSH 2.645 0.350 - 4.500 uIU/mL    Comment: Performed by a 3rd Generation assay with a functional sensitivity of <=0.01 uIU/mL. Performed at [facility], [address] [place], [place] [address]     Blood Alcohol level:  Lab Results  Component Value Date   [facility] <10 03/12/2018   ETH <10  06/02/2017    Metabolic Disorder Labs: Lab Results  Component Value Date   HGBA1C 7.2 (H) 03/12/2018   MPG 159.94 03/12/2018   MPG 125.5 06/03/2017   Lab Results  Component Value Date   PROLACTIN 8.6 02/20/2018   Lab Results  Component Value Date   CHOL 172 03/12/2018   TRIG 377 (H) 03/12/2018   HDL 37 (L) 03/12/2018   CHOLHDL 4.6 03/12/2018   VLDL 75 (H) 03/12/2018   LDLCALC 60 03/12/2018   LDLCALC 103 (H) 06/03/2017    Physical Findings: AIMS: Facial and Oral Movements Muscles of Facial Expression: None, normal Lips and Perioral Area: None, normal Jaw: None, normal Tongue: None, normal,Extremity Movements Upper (arms, wrists, hands, fingers): None, normal Lower (legs, knees, ankles, toes): None, normal, Trunk Movements Neck, shoulders, hips: None, normal, Overall Severity Severity of abnormal movements (highest score from questions above): None, normal Incapacitation due to abnormal movements: None, normal Patient's awareness of abnormal movements (rate only patient's report): No Awareness, Dental Status Current problems with teeth and/or dentures?: No Does patient usually wear dentures?: No  CIWA:  CIWA-Ar Total: 0 COWS:  COWS Total Score: 2  Musculoskeletal: Strength & Muscle Tone: within normal limits Gait & Station: normal Patient leans: N/A  Psychiatric Specialty Exam: Physical Exam  Nursing note and vitals reviewed. Constitutional: She appears well-developed and well-nourished.  HENT:  Head: Normocephalic and atraumatic.  Eyes: Pupils are equal, round, and reactive to light. Conjunctivae are normal.  Neck: Normal range of motion.  Cardiovascular: Normal heart sounds.  Respiratory: Effort normal.  GI: Soft.  Musculoskeletal: Normal range of motion.  Neurological: She is alert.  Skin: Skin is warm and dry.  Psychiatric: She has a normal mood and affect. Her behavior is normal. Judgment and thought content normal.    Review of Systems   Constitutional: Negative.   HENT: Negative.   Eyes: Negative.   Respiratory: Negative.   Cardiovascular: Negative.   Gastrointestinal: Negative.   Musculoskeletal: Negative.   Skin: Positive for itching.  Neurological: Negative.   Psychiatric/Behavioral: Negative.     Blood pressure 115/73, pulse 99, temperature 98.5 F (36.9 C), temperature source Oral, resp. rate 18, height 5\' 8"  (1.727 m), weight (!) 147.4 kg, SpO2 98 %.Body mass index is 49.42 kg/m.  General Appearance: Fairly Groomed  Eye Contact:  Good  Speech:  Clear and Coherent  Volume:  Normal  Mood:  Euthymic  Affect:  Constricted  Thought Process:  Coherent  Orientation:  Full (Time, Place, and Person)  Thought Content:  Logical  Suicidal Thoughts:  No  Homicidal Thoughts:  No  Memory:  Immediate;   Fair Recent;   Fair Remote;   Fair  Judgement:  Fair  Insight:  Fair  Psychomotor Activity:  Decreased  Concentration:  Concentration: Fair  Recall:  [employer] of Knowledge:  Fair  Language:  Fair  Akathisia:  No  Handed:  Right  AIMS (if indicated):     Assets:  Desire for Improvement Housing Physical Health  ADL's:  Intact  Cognition:  WNL  Sleep:  Number  of Hours: 7     Treatment Plan Summary: Daily contact with patient to assess and evaluate symptoms and progress in treatment, Medication management and Plan No change to medication management except add hydrocortisone cream for her hands.  Supportive therapy and encouragement.  Patient appears to be settling down and has no active suicidal thought.  Mordecai Rasmussen, MD 03/14/2018, 11:17 AM

## 2018-03-14 NOTE — Progress Notes (Signed)
Patient stayed in the room with peers and was pleasant and cooperative. Went to bed early and was found in room crying, reporting that she was made uncomfortable by a peer. "But I will be alright". Received medications and fell and remained asleep. Support and encouragements provided. Safety precautions maintained.

## 2018-03-14 NOTE — Progress Notes (Addendum)
D: Pt denies SI/HI/AVH, can contract for safety. Pt is pleasant and cooperative, engages minimal during assessments, but throughout the day begins to open up more in interactions. Pt. Out today from her room more today. Pt. Endorses a depressed and anxious mood mostly. Pt. Affect brighter throughout the day overall.   A: Q x 15 minute observation checks were completed for safety. Patient was provided with education. Patient was given/offered medications per orders. Patient  was encourage to attend groups, participate in unit activities and continue with plan of care. Pt. Chart and plans of care reviewed. Pt. Given support and encouragement.   R: Patient is complaint with medication and unit procedures. Pt. Group attendance improving. Pt. Eating good. Pt. Assessment with this writer inconsistent with self assessment sheet. Pt. This evening during visitation had a reported panic attack, given PRN medication for this with collaboration with the on call doctor to report patient's tachycardia as well.              Precautionary checks every 15 minutes for safety maintained, room free of safety hazards, patient sustains no injury or falls during this shift. Will endorse care to next shift.

## 2018-03-14 NOTE — [facility] Group Notes (Signed)
LCSW Group Therapy Note  03/14/2018 1:15pm  Type of Therapy and Topic:  Group Therapy:  Cognitive Distortions  Participation Level:  Active   Description of Group:    Patients in this group will be introduced to the topic of cognitive distortions.  Patients will identify and describe cognitive distortions, describe the feelings these distortions create for them.  Patients will identify one or more situations in their personal life where they have cognitively distorted thinking and will verbalize challenging this cognitive distortion through positive thinking skills.  Patients will practice the skill of using positive affirmations to challenge cognitive distortions using affirmation cards.    Therapeutic Goals:  1. Patient will identify two or more cognitive distortions they have used 2. Patient will identify one or more emotions that stem from use of a cognitive distortion 3. Patient will demonstrate use of a positive affirmation to counter a cognitive distortion through discussion and/or role play. 4. Patient will describe one way cognitive distortions can be detrimental to wellness   Summary of Patient Progress: The patient reported that she feels "exhausted." Patients were introduced to the topic of cognitive distortions. The patient was able to identify and describe cognitive distortions, described the feelings these distortions create for her. Patient identified a situation in  her personal life where  she has cognitively distorted thinking and was able to verbalize and challenged this cognitive distortion through positive thinking skills. Patient was able to provide support and validation to other group members.     Therapeutic Modalities:   Cognitive Behavioral Therapy Motivational Interviewing   Johnnye Sima, LCSW 03/14/2018 12:35 PM

## 2018-03-14 NOTE — Plan of Care (Addendum)
Anxious and depressed but active in the milieu. Currently attending group and remains cooperative. Patient attended group actively. Stayed in the day room then presented to the nurses station later at bedtime, complaining of anxiety and restless. Received Vistaril, 50 mg by mouth and went to bed. Patient slept for about 6 hours and did not have any other concern.

## 2018-03-14 NOTE — Plan of Care (Signed)
Pt. Is complaint with medications. Pt. Group attendance is improving. Pt. Denies si/hi/avh, can contract for safety.    Problem: Health Behavior/Discharge Planning: Goal: Compliance with therapeutic regimen will improve Outcome: Progressing

## 2018-03-15 NOTE — Plan of Care (Signed)
Pleasant and cooperative. Active in the milieu. Compliant with treatment.

## 2018-03-15 NOTE — Plan of Care (Signed)
Data: Patient is appropriate and cooperative to assessment. Patient denies SI/HI/AVH. Patient has completed daily self inventory worksheet. Patient has complaints of leg cramps and headaches today, and a pain rating of 0/10. Patient reports good sleep quality, appetite is fair for the last 24 hours. Patient rates depression "3/10" , feelings of hopelessness "2/10" and anxiety "5/10" Patients goal for today is "work on talking to the doctor and discuss a discharge plan."  Action:  Q x 15 minute observation checks were completed for safety. Patient was provided with education on medications. Patient was offered support and encouragement. Patient was given scheduled medications. Patient  was encourage to attend groups, participate in unit activities and continue with plan of care.    Response: Patient adheres with scheduled medications today. Patient has no complaints at this time. Patient is receptive to treatment and safety maintained on unit.    Problem: Coping: Goal: Coping ability will improve Outcome: Progressing   Problem: Coping: Goal: Coping ability will improve Outcome: Progressing Goal: Will verbalize feelings Outcome: Progressing

## 2018-03-15 NOTE — Progress Notes (Signed)
[facility] MD Progress Note  03/15/2018 10:20 AM Sabrina Blevins  MRN:  [id number] Subjective: Follow-up for patient with depression and suicidal ideation and PTSD.  Patient seen chart reviewed.  Patient reports mood feels better.  Denies any current suicidal ideation.  Denies any physical complaints or side effects. Principal Problem: Major depressive disorder, recurrent episode with mixed features (HCC) Diagnosis: Principal Problem:   Major depressive disorder, recurrent episode with mixed features (HCC) Active Problems:   Suicidal ideation   PTSD (post-traumatic stress disorder)   UTI (urinary tract infection)   Noncompliance with treatment  Total Time spent with patient: 20 minutes  Past Psychiatric History: Past history of chronic depression PTSD mood disorder and impulsivity  Past Medical History:  Past Medical History:  Diagnosis Date  . ADHD   . Airborne allergy, current reaction   . Asthma   . Bipolar 1 disorder (HCC)   . Bipolar disorder (HCC)   . Cholecystitis   . Morbid obesity (HCC)   . Seizures (HCC)     Past Surgical History:  Procedure Laterality Date  . ADENOIDECTOMY    . TONSILLECTOMY     Family History: History reviewed. No pertinent family history. Family Psychiatric  History: See previous note Social History:  Social History   Substance and Sexual Activity  Alcohol Use Never  . Frequency: Never     Social History   Substance and Sexual Activity  Drug Use Never    Social History   Socioeconomic History  . Marital status: Single    Spouse name: Not on file  . Number of children: Not on file  . Years of education: Not on file  . Highest education level: Not on file  Occupational History  . Not on file  Social Needs  . Financial resource strain: Not on file  . Food insecurity:    Worry: Not on file    Inability: Not on file  . Transportation needs:    Medical: Not on file    Non-medical: Not on file  Tobacco Use  . Smoking status:  Never Smoker  . Smokeless tobacco: Never Used  Substance and Sexual Activity  . Alcohol use: Never    Frequency: Never  . Drug use: Never  . Sexual activity: Yes    Birth control/protection: None  Lifestyle  . Physical activity:    Days per week: Not on file    Minutes per session: Not on file  . Stress: Not on file  Relationships  . Social connections:    Talks on phone: Not on file    Gets together: Not on file    Attends religious service: Not on file    Active member of club or organization: Not on file    Attends meetings of clubs or organizations: Not on file    Relationship status: Not on file  Other Topics Concern  . Not on file  Social History Narrative   ** Merged History Encounter **       Additional Social History:                         Sleep: Fair  Appetite:  Fair  Current Medications: Current Facility-Administered Medications  Medication Dose Route Frequency Provider Last Rate Last Dose  . acetaminophen (TYLENOL) tablet 650 mg  650 mg Oral Q6H PRN ,  T, MD      . alum & mag hydroxide-simeth (MAALOX/MYLANTA) 200-200-20 MG/5ML suspension 30 mL  30 mL  Oral Q4H PRN ,  T, MD      . hydrocortisone cream 1 %   Topical BID ,  T, MD      . hydrOXYzine (ATARAX/VISTARIL) tablet 50 mg  50 mg Oral TID PRN , Jackquline Denmark T, MD   50 mg at 03/14/18 2335  . lamoTRIgine (LAMICTAL) tablet 50 mg  50 mg Oral QHS Pucilowska, Jolanta B, MD   50 mg at 03/14/18 2146  . magnesium hydroxide (MILK OF MAGNESIA) suspension 30 mL  30 mL Oral Daily PRN ,  T, MD      . prazosin (MINIPRESS) capsule 2 mg  2 mg Oral BID Pucilowska, Jolanta B, MD   2 mg at 03/15/18 0845  . traZODone (DESYREL) tablet 100 mg  100 mg Oral QHS PRN , Jackquline Denmark T, MD        Lab Results: No results found for this or any previous visit (from the past 48 hour(s)).  Blood Alcohol level:  Lab Results  Component Value Date   ETH <10 03/12/2018   ETH <10  06/02/2017    Metabolic Disorder Labs: Lab Results  Component Value Date   HGBA1C 7.2 (H) 03/12/2018   MPG 159.94 03/12/2018   MPG 125.5 06/03/2017   Lab Results  Component Value Date   PROLACTIN 8.6 02/20/2018   Lab Results  Component Value Date   CHOL 172 03/12/2018   TRIG 377 (H) 03/12/2018   HDL 37 (L) 03/12/2018   CHOLHDL 4.6 03/12/2018   VLDL 75 (H) 03/12/2018   LDLCALC 60 03/12/2018   LDLCALC 103 (H) 06/03/2017    Physical Findings: AIMS: Facial and Oral Movements Muscles of Facial Expression: None, normal Lips and Perioral Area: None, normal Jaw: None, normal Tongue: None, normal,Extremity Movements Upper (arms, wrists, hands, fingers): None, normal Lower (legs, knees, ankles, toes): None, normal, Trunk Movements Neck, shoulders, hips: None, normal, Overall Severity Severity of abnormal movements (highest score from questions above): None, normal Incapacitation due to abnormal movements: None, normal Patient's awareness of abnormal movements (rate only patient's report): No Awareness, Dental Status Current problems with teeth and/or dentures?: No Does patient usually wear dentures?: No  CIWA:  CIWA-Ar Total: 0 COWS:  COWS Total Score: 2  Musculoskeletal: Strength & Muscle Tone: within normal limits Gait & Station: normal Patient leans: N/A  Psychiatric Specialty Exam: Physical Exam  Nursing note and vitals reviewed. Constitutional: She appears well-developed and well-nourished.  HENT:  Head: Normocephalic and atraumatic.  Eyes: Pupils are equal, round, and reactive to light. Conjunctivae are normal.  Neck: Normal range of motion.  Cardiovascular: Regular rhythm and normal heart sounds.  Respiratory: Effort normal. No respiratory distress.  GI: Soft.  Musculoskeletal: Normal range of motion.  Neurological: She is alert.  Skin: Skin is warm and dry.  Psychiatric: Judgment normal. Her affect is blunt. Her speech is delayed. She is slowed. Thought  content is not paranoid. Cognition and memory are normal. She expresses no homicidal and no suicidal ideation.    Review of Systems  Constitutional: Negative.   HENT: Negative.   Eyes: Negative.   Respiratory: Negative.   Cardiovascular: Negative.   Gastrointestinal: Negative.   Musculoskeletal: Negative.   Skin: Negative.   Neurological: Negative.   Psychiatric/Behavioral: Negative.     Blood pressure 139/84, pulse (!) 114, temperature 98.2 F (36.8 C), temperature source Oral, resp. rate 18, height 5\' 8"  (1.727 m), weight (!) 147.4 kg, SpO2 99 %.Body mass index is 49.42 kg/m.  General Appearance: Casual  Eye  Contact:  Good  Speech:  Slow  Volume:  Decreased  Mood:  Euthymic  Affect:  Constricted  Thought Process:  Goal Directed  Orientation:  Full (Time, Place, and Person)  Thought Content:  Logical  Suicidal Thoughts:  No  Homicidal Thoughts:  No  Memory:  Immediate;   Fair Recent;   Fair Remote;   Fair  Judgement:  Fair  Insight:  Fair  Psychomotor Activity:  Normal  Concentration:  Concentration: Fair  Recall:  [employer]  Fund of Knowledge:  Fair  Language:  Fair  Akathisia:  No  Handed:  Right  AIMS (if indicated):     Assets:  Desire for Improvement Housing Physical Health Resilience  ADL's:  Intact  Cognition:  WNL  Sleep:  Number of Hours: 5.45     Treatment Plan Summary: Daily contact with patient to assess and evaluate symptoms and progress in treatment, Medication management and Plan Supportive therapy.  Review of treatment plan.  No change to current medication.  Encourage group attendance.  Mordecai RasmussenJohn , MD 03/15/2018, 10:20 AM

## 2018-03-16 MED ORDER — HYDROXYZINE HCL 50 MG PO TABS: 50.0000 mg | ORAL_TABLET | Freq: Three times a day (TID) | ORAL | 1 refills | Status: DC | PRN

## 2018-03-16 MED ORDER — LAMOTRIGINE 25 MG PO TABS: 50.0000 mg | ORAL_TABLET | Freq: Every day | ORAL | 1 refills | Status: DC

## 2018-03-16 MED ORDER — TRAZODONE HCL 100 MG PO TABS: 100.0000 mg | ORAL_TABLET | Freq: Every evening | ORAL | 1 refills | Status: DC | PRN

## 2018-03-16 MED ORDER — PRAZOSIN HCL 2 MG PO CAPS: 2.0000 mg | ORAL_CAPSULE | Freq: Two times a day (BID) | ORAL | 1 refills | Status: DC

## 2018-03-16 NOTE — Progress Notes (Signed)
Recreation Therapy Notes  INPATIENT RECREATION TR PLAN  Patient Details Name: Sabrina Blevins MRN: [id number] DOB: 21-Jan-1996 Today's Date: 03/16/2018  Rec Therapy Plan Is patient appropriate for Therapeutic Recreation?: Yes Treatment times per week: at least 3 Estimated Length of Stay: 5-7 days TR Treatment/Interventions: Group participation (Comment)  Discharge Criteria Pt will be discharged from therapy if:: Discharged Treatment plan/goals/alternatives discussed and agreed upon by:: Patient/family  Discharge Summary Short term goals set: Patient will identify 3 healthy leisure activities they can access in the community post d/c within 5 recreation therapy group sessions Short term goals met: Adequate for discharge Progress toward goals comments: Groups attended Which groups?: Other (Comment)(Problem Solving) Reason goals not met: N/A Therapeutic equipment acquired: N/A Reason patient discharged from therapy: Discharge from hospital Pt/family agrees with progress & goals achieved: Yes Date patient discharged from therapy: 03/16/18   Kruz Chiu 03/16/2018, 11:26 AM

## 2018-03-16 NOTE — BHH Suicide Risk Assessment (Signed)
[facility] Discharge Suicide Risk Assessment   Principal Problem: Major depressive disorder, recurrent episode with mixed features [facility]) Discharge Diagnoses: Principal Problem:   Major depressive disorder, recurrent episode with mixed features (HCC) Active Problems:   Suicidal ideation   PTSD (post-traumatic stress disorder)   UTI (urinary tract infection)   Noncompliance with treatment   Total Time spent with patient: 20 minutes  Musculoskeletal: Strength & Muscle Tone: within normal limits Gait & Station: normal Patient leans: N/A  Psychiatric Specialty Exam: Review of Systems  Neurological: Negative.   Psychiatric/Behavioral: Negative.   All other systems reviewed and are negative.   Blood pressure 120/85, pulse (!) 102, temperature 97.8 F (36.6 C), temperature source Oral, resp. rate 18, height 5\' 8"  (1.727 m), weight (!) 147.4 kg, SpO2 98 %.Body mass index is 49.42 kg/m.  General Appearance: Casual  Eye Contact::  Good  Speech:  Clear and Coherent409  Volume:  Normal  Mood:  Euthymic  Affect:  Appropriate  Thought Process:  Goal Directed and Descriptions of Associations: Intact  Orientation:  Full (Time, Place, and Person)  Thought Content:  WDL  Suicidal Thoughts:  No  Homicidal Thoughts:  No  Memory:  Immediate;   Fair Recent;   Fair Remote;   Fair  Judgement:  Fair  Insight:  Present  Psychomotor Activity:  Normal  Concentration:  Fair  Recall:  [employer] of Knowledge:Fair  Language: Fair  Akathisia:  No  Handed:  Right  AIMS (if indicated):     Assets:  Communication Skills Desire for Improvement Financial Resources/Insurance Housing Physical Health Resilience Social Support  Sleep:  Number of Hours: 5.75  Cognition: WNL  ADL's:  Intact   Mental Status Per Nursing Assessment::   On Admission:  Plan to harm others  Demographic Factors:  Caucasian  Loss Factors: NA  Historical Factors: Prior suicide attempts and Impulsivity  Risk Reduction  Factors:   Sense of responsibility to family, Living with another person, especially a relative, Positive social support and Positive therapeutic relationship  Continued Clinical Symptoms:  Bipolar Disorder:   Depressive phase  Cognitive Features That Contribute To Risk:  None    Suicide Risk:  Minimal: No identifiable suicidal ideation.  Patients presenting with no risk factors but with morbid ruminations; may be classified as minimal risk based on the severity of the depressive symptoms  Follow-up Information    [facility] Go on 03/18/2018.   Why:  Please hospital discharge appointment on 03/18/18 at 9:30.  Let them know at the appointment that you would like to begin medication managment services.  Contact information: [address] [place] [address] [phone]           Plan Of Care/Follow-up recommendations:  Activity:  as tolerated Diet:  low sodium heart healthy Other:  keep follow up appointment  Kristine Linea, MD 03/16/2018, 11:55 AM

## 2018-03-16 NOTE — Progress Notes (Signed)
Recreation Therapy Notes  Date: 03/16/2018  Time: 9:30 am  Location: Craft Room  Behavioral response: Appropriate  Intervention Topic: Problem Solving  Discussion/Intervention:  Group content on today was focused on problem solving. The group described what problem solving is. Patients expressed how problems affect them and how they deal with problems. Individuals identified healthy ways to deal with problems. Patients explained what normally happens to them when they do not deal with problems. The group expressed reoccurring problems for them. The group participated in the intervention "Ways to Solve problems" where patients were given a chance to explore different ways to solve problems.  Clinical Observations/Feedback:  Patient came to group and stated problem solving is walking away from situations. She stated she solves problems by stating "I statements". Participant explained that she does not normally ask for help because she does not like rejection. Individual was social with peers and staff while participating in the intervention. Sabrina Blevins         Thorvald Orsino 03/16/2018 11:14 AM
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
## 2018-03-16 NOTE — Progress Notes (Signed)
Patient was visible in the milieu and maintained and maintained a positive attitude. Attended group with peers. Alert and oriented. Pleasant and cooperative. Played cards with a peer. Supportive to others. Patient complained of anxiety toward medication time: received Vistaril along with the scheduled medication. Patient stayed around until bedtime. Slept throughout the night and appeared to be comfortable in bed. Safety precautions maintained.

## 2018-03-16 NOTE — Progress Notes (Signed)
D: Patient states she slept poorly last night.  She is visible in the milieu this morning.  Her appetite is good; her energy level is low and her concentration is good.  She may be a possible discharge today.  She denies any thoughts of self harm.  She denies any depressive symptoms.  Her goal today is to "talk about discharge."  A: Continue to monitor medication management and MD orders.  Safety checks completed every 15 minutes per protocol.  Offer support and encouragement as needed.  R: Patient is receptive to staff; his/her behavior is appropriate.

## 2018-03-16 NOTE — Progress Notes (Signed)
Discharge note:  Patient discharged home per MD order.  Reviewed AVS/transition record with patient and she indicates understanding.  Patient was upset with her mother and wanted to just walk out.  Informed patient that she would have to wait for her ride to get here before I could discharge her.  She denies any thoughts of self harm.  She states, "I'm gonna be right back in here because of my mother."  Patient left ambulatory with her mother.

## 2018-03-16 NOTE — Plan of Care (Signed)
  Problem: Coping: Goal: Coping ability will improve Outcome: Completed/Met   Problem: Health Behavior/Discharge Planning: Goal: Identification of resources available to assist in meeting health care needs will improve Outcome: Completed/Met   Problem: Coping: Goal: Coping ability will improve Outcome: Completed/Met Goal: Will verbalize feelings Outcome: Completed/Met   Problem: Health Behavior/Discharge Planning: Goal: Ability to make decisions will improve Outcome: Completed/Met Goal: Compliance with therapeutic regimen will improve Outcome: Completed/Met   Problem: Activity: Goal: Interest or engagement in leisure activities will improve Outcome: Completed/Met Goal: Imbalance in normal sleep/wake cycle will improve Outcome: Completed/Met   Problem: Health Behavior/Discharge Planning: Goal: Identification of resources available to assist in meeting health care needs will improve Outcome: Completed/Met   Problem: Self-Concept: Goal: Will verbalize positive feelings about self Outcome: Completed/Met   Problem: Education: Goal: Ability to state activities that reduce stress will improve Outcome: Completed/Met   Problem: Self-Concept: Goal: Ability to identify factors that promote anxiety will improve Outcome: Completed/Met Goal: Level of anxiety will decrease Outcome: Completed/Met Goal: Ability to modify response to factors that promote anxiety will improve Outcome: Completed/Met

## 2018-03-16 NOTE — BHH Group Notes (Signed)
Overcoming Obstacles  03/16/2018 1PM  Type of Therapy and Topic:  Group Therapy:  Overcoming Obstacles  Participation Level:  Active    Description of Group:    In this group patients will be encouraged to explore what they see as obstacles to their own wellness and recovery. They will be guided to discuss their thoughts, feelings, and behaviors related to these obstacles. The group will process together ways to cope with barriers, with attention given to specific choices patients can make. Each patient will be challenged to identify changes they are motivated to make in order to overcome their obstacles. This group will be process-oriented, with patients participating in exploration of their own experiences as well as giving and receiving support and challenge from other group members.   Therapeutic Goals: 1. Patient will identify personal and current obstacles as they relate to admission. 2. Patient will identify barriers that currently interfere with their wellness or overcoming obstacles.  3. Patient will identify feelings, thought process and behaviors related to these barriers. 4. Patient will identify two changes they are willing to make to overcome these obstacles:      Summary of Patient Progress Pt left group early, called by nursing staff and did not return    Therapeutic Modalities:   Cognitive Behavioral Therapy Solution Focused Therapy Motivational Interviewing Relapse Prevention Therapy    Lowella Dandy, MSW, LCSW 03/16/2018 2:07 PM

## 2018-03-16 NOTE — Progress Notes (Signed)
  [facility] :  Will you be returning to the same living situation after discharge:  Yes,  pt is returning home.  At discharge, do you have transportation home?: Yes,  pt reports that her mother will provide transportation. Do you have the ability to pay for your medications: Yes,  Medicaid  Release of information consent forms completed and in the chart;  Patient's signature needed at discharge.  Patient to Follow up at: Follow-up Information    [facility] Go on 03/18/2018.   Why:  Please hospital discharge appointment on 03/18/18 at 9:30.  Let them know at the appointment that you would like to begin medication managment services.  Contact information: [address] [place] [address] [phone]           Next level of care provider has [facility] Link:no  Safety Planning and Suicide Prevention discussed: Yes,  SPE completed with patient, she declined familly contact. or No.  Have you used any form of tobacco in the last 30 days? (Cigarettes, Smokeless Tobacco, Cigars, and/or Pipes): No  Has patient been referred to the Quitline?: N/A patient is not a smoker  Patient has been referred for addiction treatment: N/A  Harden MoMichaela J Rasheedah Reis, LCSW 03/16/2018, 9:22 AM

## 2018-03-25 ENCOUNTER — Other Ambulatory Visit: Payer: Self-pay

## 2018-03-25 ENCOUNTER — Emergency Department
Admission: EM | Admit: 2018-03-25 | Discharge: 2018-03-25 | Disposition: A | Discharge status: Home / Self Care | Payer: Medicaid Other | Attending: Emergency Medicine | Admitting: Emergency Medicine

## 2018-03-25 DIAGNOSIS — Z5321 Procedure and treatment not carried out due to patient leaving prior to being seen by health care provider: Secondary | ICD-10-CM | POA: Insufficient documentation

## 2018-03-25 DIAGNOSIS — R51 Headache: Secondary | ICD-10-CM | POA: Diagnosis not present

## 2018-03-25 DIAGNOSIS — R42 Dizziness and giddiness: Secondary | ICD-10-CM | POA: Insufficient documentation

## 2018-03-25 DIAGNOSIS — R079 Chest pain, unspecified: Secondary | ICD-10-CM | POA: Insufficient documentation

## 2018-03-25 LAB — COMPREHENSIVE METABOLIC PANEL
ALK PHOS: 78 U/L (ref 38–126)
ALT: 32 U/L (ref 0–44)
AST: 25 U/L (ref 15–41)
Albumin: 4 g/dL (ref 3.5–5.0)
Anion gap: 9 (ref 5–15)
BUN: 9 mg/dL (ref 6–20)
CO2: 24 mmol/L (ref 22–32)
Calcium: 8.6 mg/dL — ABNORMAL LOW (ref 8.9–10.3)
Chloride: 102 mmol/L (ref 98–111)
Creatinine, Ser: 0.59 mg/dL (ref 0.44–1.00)
GFR calc Af Amer: >60 mL/min (ref >60)
GFR calc non Af Amer: >60 mL/min (ref >60)
Glucose, Bld: 190 mg/dL — ABNORMAL HIGH (ref 70–99)
Potassium: 3.6 mmol/L (ref 3.5–5.1)
Sodium: 135 mmol/L (ref 135–145)
Total Bilirubin: 0.5 mg/dL (ref 0.3–1.2)
Total Protein: 7.3 g/dL (ref 6.5–8.1)

## 2018-03-25 LAB — CBC
HCT: 43.4 % (ref 36.0–46.0)
HEMOGLOBIN: 14.3 g/dL (ref 12.0–15.0)
MCH: 27.6 pg (ref 26.0–34.0)
MCHC: 32.9 g/dL (ref 30.0–36.0)
MCV: 83.8 fL (ref 80.0–100.0)
Platelets: 361 10*3/uL (ref 150–400)
RBC: 5.18 MIL/uL — ABNORMAL HIGH (ref 3.87–5.11)
RDW: 13.4 % (ref 11.5–15.5)
WBC: 18 10*3/uL — ABNORMAL HIGH (ref 4.0–10.5)
nRBC: 0 % (ref 0.0–0.2)

## 2018-03-25 LAB — TROPONIN I: Troponin I: <0.03 ng/mL (ref <0.03)

## 2018-03-25 NOTE — ED Notes (Signed)
Pt ambulatory to STAT desk without difficulty or distress noted to report she is leaving now; encouraged to stay & be eval further but pt declines stating that she doesn't want to wait any longer

## 2018-03-25 NOTE — ED Triage Notes (Signed)
Pt in with co chest pain since this am along with headache. STates did see urgent care and was told it was not cardiac but did not do any testing and was told to come to ED.

## 2018-04-14 ENCOUNTER — Emergency Department
Admission: EM | Admit: 2018-04-14 | Discharge: 2018-04-14 | Disposition: A | Discharge status: Home / Self Care | Payer: Medicaid Other | Attending: Emergency Medicine | Admitting: Emergency Medicine

## 2018-04-14 ENCOUNTER — Other Ambulatory Visit: Payer: Self-pay

## 2018-04-14 ENCOUNTER — Encounter: Payer: Self-pay | Admitting: Emergency Medicine

## 2018-04-14 DIAGNOSIS — Z5321 Procedure and treatment not carried out due to patient leaving prior to being seen by health care provider: Secondary | ICD-10-CM | POA: Diagnosis not present

## 2018-04-14 DIAGNOSIS — R509 Fever, unspecified: Secondary | ICD-10-CM | POA: Diagnosis not present

## 2018-04-14 DIAGNOSIS — R05 Cough: Secondary | ICD-10-CM | POA: Insufficient documentation

## 2018-04-14 LAB — INFLUENZA PANEL BY PCR (TYPE A & B)
INFLBPCR: NEGATIVE
Influenza A By PCR: NEGATIVE

## 2018-04-14 MED ORDER — HYDROXYZINE HCL 25 MG PO TABS
25.0000 mg | ORAL_TABLET | Freq: Once | ORAL | Status: AC
  Administered 2018-04-14: 25 mg via ORAL
  Filled 2018-04-14: qty 1

## 2018-04-14 NOTE — ED Triage Notes (Signed)
Patient ambulatory to triage with steady gait, without difficulty or distress noted; pt reports 2-3 days, prod cough yellow sputum and fever; taking OTC nyquil without relief (st med has made her face feel like its burning)

## 2018-04-14 NOTE — ED Notes (Signed)
No answer when called from lobby x1 

## 2018-04-14 NOTE — ED Notes (Signed)
No answer when called from lobby x2. 

## 2018-04-14 NOTE — ED Notes (Signed)
No answer when called from lobby x3. 

## 2018-04-14 NOTE — ED Notes (Addendum)
Pt c/o increasing itchy rash to face; did not taken any benadryl PTA; st allergic to generic brand; spoke with Dr Darnelle Catalan and verbal order obtained for atarax

## 2018-06-06 ENCOUNTER — Emergency Department
Admission: EM | Admit: 2018-06-06 | Discharge: 2018-06-06 | Disposition: A | Discharge status: Home / Self Care | Payer: Medicaid Other | Attending: Emergency Medicine | Admitting: Emergency Medicine

## 2018-06-06 ENCOUNTER — Encounter: Payer: Self-pay | Admitting: Emergency Medicine

## 2018-06-06 ENCOUNTER — Emergency Department: Payer: Medicaid Other

## 2018-06-06 ENCOUNTER — Other Ambulatory Visit: Payer: Self-pay

## 2018-06-06 DIAGNOSIS — F909 Attention-deficit hyperactivity disorder, unspecified type: Secondary | ICD-10-CM | POA: Diagnosis not present

## 2018-06-06 DIAGNOSIS — Z79899 Other long term (current) drug therapy: Secondary | ICD-10-CM | POA: Insufficient documentation

## 2018-06-06 DIAGNOSIS — R590 Localized enlarged lymph nodes: Secondary | ICD-10-CM | POA: Diagnosis not present

## 2018-06-06 DIAGNOSIS — J45909 Unspecified asthma, uncomplicated: Secondary | ICD-10-CM | POA: Diagnosis not present

## 2018-06-06 DIAGNOSIS — R599 Enlarged lymph nodes, unspecified: Secondary | ICD-10-CM | POA: Diagnosis present

## 2018-06-06 LAB — CBC WITH DIFFERENTIAL/PLATELET
Abs Immature Granulocytes: 0.34 10*3/uL — ABNORMAL HIGH (ref 0.00–0.07)
Basophils Absolute: 0.1 10*3/uL (ref 0.0–0.1)
Basophils Relative: 1 %
Eosinophils Absolute: 0.3 10*3/uL (ref 0.0–0.5)
Eosinophils Relative: 2 %
HCT: 43.1 % (ref 36.0–46.0)
Hemoglobin: 14.3 g/dL (ref 12.0–15.0)
Immature Granulocytes: 2 %
Lymphocytes Relative: 34 %
Lymphs Abs: 5.9 10*3/uL — ABNORMAL HIGH (ref 0.7–4.0)
MCH: 27.5 pg (ref 26.0–34.0)
MCHC: 33.2 g/dL (ref 30.0–36.0)
MCV: 82.9 fL (ref 80.0–100.0)
Monocytes Absolute: 1.1 10*3/uL — ABNORMAL HIGH (ref 0.1–1.0)
Monocytes Relative: 6 %
Neutro Abs: 9.6 10*3/uL — ABNORMAL HIGH (ref 1.7–7.7)
Neutrophils Relative %: 55 %
Platelets: 345 10*3/uL (ref 150–400)
RBC: 5.2 MIL/uL — ABNORMAL HIGH (ref 3.87–5.11)
RDW: 13.2 % (ref 11.5–15.5)
Smear Review: NORMAL
WBC: 17.4 10*3/uL — ABNORMAL HIGH (ref 4.0–10.5)
nRBC: 0 % (ref 0.0–0.2)

## 2018-06-06 LAB — BASIC METABOLIC PANEL
Anion gap: 12 (ref 5–15)
BUN: 12 mg/dL (ref 6–20)
CO2: 25 mmol/L (ref 22–32)
Calcium: 9.8 mg/dL (ref 8.9–10.3)
Chloride: 99 mmol/L (ref 98–111)
Creatinine, Ser: 0.62 mg/dL (ref 0.44–1.00)
GFR calc Af Amer: >60 mL/min (ref >60)
GFR calc non Af Amer: >60 mL/min (ref >60)
Glucose, Bld: 205 mg/dL — ABNORMAL HIGH (ref 70–99)
Potassium: 3.8 mmol/L (ref 3.5–5.1)
Sodium: 136 mmol/L (ref 135–145)

## 2018-06-06 NOTE — ED Triage Notes (Addendum)
Pt here for postauricular swelling. Sent from urgent care for evaluation of possible mastoiditis.  Pt has OM of left ear and possibly mild OM on right per paperwork.  Denies fevers.  Swelling started last night.  No pain at this time. Some tinnitus right ear.  Mother reports via court decision within last week she was deemed legal guardian, does not have paperwork yet.  Requested to bring when has it, allowed to stay as legal guardian.

## 2018-06-06 NOTE — ED Provider Notes (Signed)
[facility] Emergency Department Provider Note  ____________________________________________  Time seen: Approximately 4:47 PM  I have reviewed the triage vital signs and the nursing notes.   HISTORY  Chief Complaint eval for mastoiditis   HPI Sabrina Blevins is a 23 y.o. female with history of bipolar disorder, seizures, PTSD, morbidly obesitywho presents from urgent care for concerns of mastoiditis.  Patient reports that 2 days ago she felt like she had an insect bite behind her right ear.  Yesterday she noticed swelling which got worse today.  She is complaining of mild constant pain located in the area posterior to her right ear.  She has had recurrent otitis media.  She does not have any pain in her ears at this time but does have tinnitus which she has had in the past.  She denies fever, headache, sore throat, cough, congestion.  Past Medical History:  Diagnosis Date   ADHD    Airborne allergy, current reaction    Asthma    Bipolar 1 disorder (HCC)    Bipolar disorder (HCC)    Cholecystitis    Morbid obesity (HCC)    Seizures (HCC)     Patient Active Problem List   Diagnosis Date Noted   PTSD (post-traumatic stress disorder) 03/13/2018   UTI (urinary tract infection) 03/13/2018   Noncompliance with treatment 03/13/2018   Major depressive disorder, recurrent episode with mixed features (HCC) 06/03/2017   Suicidal ideation 06/02/2017    Past Surgical History:  Procedure Laterality Date   ADENOIDECTOMY     TONSILLECTOMY      Prior to Admission medications   Medication Sig Start Date End Date Taking? Authorizing Provider  hydrOXYzine (ATARAX/VISTARIL) 50 MG tablet Take 1 tablet (50 mg total) by mouth 3 (three) times daily as needed for anxiety. 03/16/18   Pucilowska, Braulio Conte B, MD  lamoTRIgine (LAMICTAL) 25 MG tablet Take 2 tablets (50 mg total) by mouth at bedtime. Until 03/27/2018, 4 tabs for two weeks, then 8 tabs or 200  mg after 03/16/18   Pucilowska, Jolanta B, MD  prazosin (MINIPRESS) 2 MG capsule Take 1 capsule (2 mg total) by mouth 2 (two) times daily. 03/16/18   Pucilowska, Braulio Conte B, MD  traZODone (DESYREL) 100 MG tablet Take 1 tablet (100 mg total) by mouth at bedtime as needed for sleep. 03/16/18   Pucilowska, Ellin Goodie, MD    Allergies Bee venom; Cinnamon; Cinnamon; Contrast media [iodinated diagnostic agents]; Depakote [valproic acid]; Motrin [ibuprofen]; Nutmeg oil (myristica oil); Penicillins; Penicillins; Benadryl [diphenhydramine hcl]; Oxcarbazepine; and Topamax [topiramate]  History reviewed. No pertinent family history.  Social History Social History   Tobacco Use   Smoking status: Never Smoker   Smokeless tobacco: Never Used  Substance Use Topics   Alcohol use: Never    Frequency: Never   Drug use: Never    Review of Systems  Constitutional: Negative for fever. Eyes: Negative for visual changes. ENT: Negative for sore throat. + Swelling and pain behind the R ear Neck: No neck pain  Cardiovascular: Negative for chest pain. Respiratory: Negative for shortness of breath. Musculoskeletal: Negative for back pain. Skin: Negative for rash. Neurological: Negative for headaches, weakness or numbness. Psych: No SI or HI  ____________________________________________   PHYSICAL EXAM:  VITAL SIGNS: ED Triage Vitals  Enc Vitals Group     BP 06/06/18 1533 (!) 186/106     Pulse Rate 06/06/18 1533 (!) 106     Resp 06/06/18 1533 18     Temp 06/06/18  1533 98.1 F (36.7 C)     Temp Source 06/06/18 1533 Oral     SpO2 06/06/18 1533 98 %     Weight 06/06/18 1525 300 lb (136.1 kg)     Height 06/06/18 1525  (1.727 m)     Head Circumference --      Peak Flow --      Pain Score 06/06/18 1524 0     Pain Loc --      Pain Edu? --      Excl. in GC? --     Constitutional: Alert and oriented. Well appearing and in no apparent distress. HEENT:      Head: Normocephalic and  atraumatic.   Patient has diffuse folliculitis of the scalp      Eyes: Conjunctivae are normal. Sclera is non-icteric.       Ear: Bilateral TMs are visualized and clear with no evidence of otitis media or otitis externa.  Patient has mild swelling over the posterior auricular region which feels like enlarged lymph nodes with no overlying erythema or warmth.      Mouth/Throat: Mucous membranes are moist.  Oropharynx is clear with no exudates, no erythema.      Neck: Supple with no signs of meningismus. Cardiovascular: Regular rate and rhythm. No murmurs, gallops, or rubs. 2+ symmetrical distal pulses are present in all extremities. No JVD. Respiratory: Normal respiratory effort. Lungs are clear to auscultation bilaterally. No wheezes, crackles, or rhonchi.  Musculoskeletal: Nontender with normal range of motion in all extremities. No edema, cyanosis, or erythema of extremities. Neurologic: Normal speech and language. Face is symmetric. Moving all extremities. No gross focal neurologic deficits are appreciated. Skin: Skin is warm, dry and intact. No rash noted. Psychiatric: Mood and affect are normal. Speech and behavior are normal.  ____________________________________________   LABS (all labs ordered are listed, but only abnormal results are displayed)  Labs Reviewed  CBC WITH DIFFERENTIAL/PLATELET - Abnormal; Notable for the following components:      Result Value   WBC 17.4 (*)    RBC 5.20 (*)    Neutro Abs 9.6 (*)    Lymphs Abs 5.9 (*)    Monocytes Absolute 1.1 (*)    Abs Immature Granulocytes 0.34 (*)    All other components within normal limits  BASIC METABOLIC PANEL - Abnormal; Notable for the following components:   Glucose, Bld 205 (*)    All other components within normal limits   ____________________________________________  EKG  none  ____________________________________________  RADIOLOGY  I have personally reviewed the images performed during this visit and I  agree with the Radiologist's read.   Interpretation by Radiologist:  Ct Temporal Bones Wo Contrast  Result Date: 06/06/2018 CLINICAL DATA:  23 year old female with postauricular swelling since last night, possible mastoiditis. Suspected otitis media on the left, possibly mild on the right. Denies fever. EXAM: CT TEMPORAL BONES WITHOUT CONTRAST TECHNIQUE: Axial and coronal plane CT imaging of the petrous temporal bones was performed with thin-collimation image reconstruction. No intravenous contrast was administered. Multiplanar CT image reconstructions were also generated. COMPARISON:  None. FINDINGS: Negative visible noncontrast brain parenchyma. Visualized orbit soft tissues are within normal limits. Visible bilateral pinna appears symmetric and normal. There are mildly prominent bilateral periauricular lymph nodes, including suspected small bilateral parotid space nodes, right greater than left level 5 nodes measuring 8-9 millimeters diameter (series 3, image 15 on the right and image 3 on the left. Otherwise negative visible noncontrast scalp and face/upper neck soft  tissues. Visible paranasal sinuses are mildly hyperplastic and well pneumatized. Minimal paranasal sinus mucosal thickening. In general skull base bone mineralization appears normal. LEFT TEMPORAL BONE: Minimal debris in the left EAC. No abnormality of the left tympanic membrane is evident. The left tympanic cavity is clear. The scutum and ossicles appear intact and normally aligned. Left mastoid antrum and air cells are clear. Left IAC, cochlea, vestibule and vestibular aqueduct are within normal limits. Left semicircular canals and course of the left 7th nerve are within normal limits. RIGHT TEMPORAL BONE: Minimal debris in the right EAC. No abnormality of the right tympanic membrane is evident. The right tympanic cavity is clear. The scutum and ossicles appear intact and normally aligned. The right mastoid antrum and air cells are clear.  Right IAC, cochlea, vestibule and vestibular aqueduct are within normal limits. Right semicircular canals and course of the right 7th nerve are within normal limits. IMPRESSION: 1. No noncontrast CT abnormality of the temporal bones. No evidence of otitis media or mastoiditis. 2. Mildly enlarged bilateral periauricular lymph nodes may be reactive, with no other periauricular soft tissue abnormality evident. Electronically Signed   By: Odessa FlemingH  Hall M.D.   On: 06/06/2018 16:17     ____________________________________________   PROCEDURES  Procedure(s) performed: None Procedures Critical Care performed:  None ____________________________________________   INITIAL IMPRESSION / ASSESSMENT AND PLAN / ED COURSE  23 y.o. female with history of bipolar disorder, seizures, PTSD, morbidly obesitywho presents from urgent care for concerns of mastoiditis due to pain and swelling in the right posterior auricular region.  Patient does have enlarged lymph nodes in that region, no obvious infection at this time with clear TMs, no evidence of otitis media or otitis externa.  She does have diffuse folliculitis of the scalp which could be a possible cause of her enlarged lymph nodes.  Oropharynx is clear with no exudates or erythema.  CT was done showing no evidence of mastoiditis.  Recommended warm compresses and Tylenol for pain.  Discussed return precautions for worsening pain, redness of the skin or warmth.  Also discussed follow-up with primary care doctor in a week if the swelling still present for lymph node biopsy.      As part of my medical decision making, I reviewed the following data within the electronic MEDICAL RECORD NUMBER History obtained from family, Nursing notes reviewed and incorporated, Labs reviewed , Old chart reviewed, Radiograph reviewed , Notes from prior ED visits and [place] Controlled Substance Database    Pertinent labs & imaging results that were available during my care of the patient were  reviewed by me and considered in my medical decision making (see chart for details).    ____________________________________________   FINAL CLINICAL IMPRESSION(S) / ED DIAGNOSES  Final diagnoses:  Posterior auricular lymphadenopathy      NEW MEDICATIONS STARTED DURING THIS VISIT:  ED Discharge Orders    None       Note:  This document was prepared using Dragon voice recognition software and may include unintentional dictation errors.    Don PerkingVeronese, [place], MD 06/06/18 [phone]

## 2018-06-06 NOTE — ED Notes (Signed)
Pt signed physical discharge paperwork. 

## 2018-06-06 NOTE — Discharge Instructions (Addendum)
Apply heat compresses and take tylenol for pain. Return to the ER if the pain is worse, if you have redness or warmth of the area. Otherwise follow up with your doctor in 7 days if the swelling has not resolved for a biopsy.

## 2018-07-14 ENCOUNTER — Ambulatory Visit: Payer: Medicaid Other | Admitting: Dietician

## 2018-12-19 IMAGING — DX DG ANKLE COMPLETE 3+V*L*
3 series · 3 of 3 positions shown · non-contrast
Comparison: None.

CLINICAL DATA: Acute left ankle pain and swelling following fall.
Initial encounter.

EXAM:
LEFT ANKLE COMPLETE - 3+ VIEW

[ankle ap]
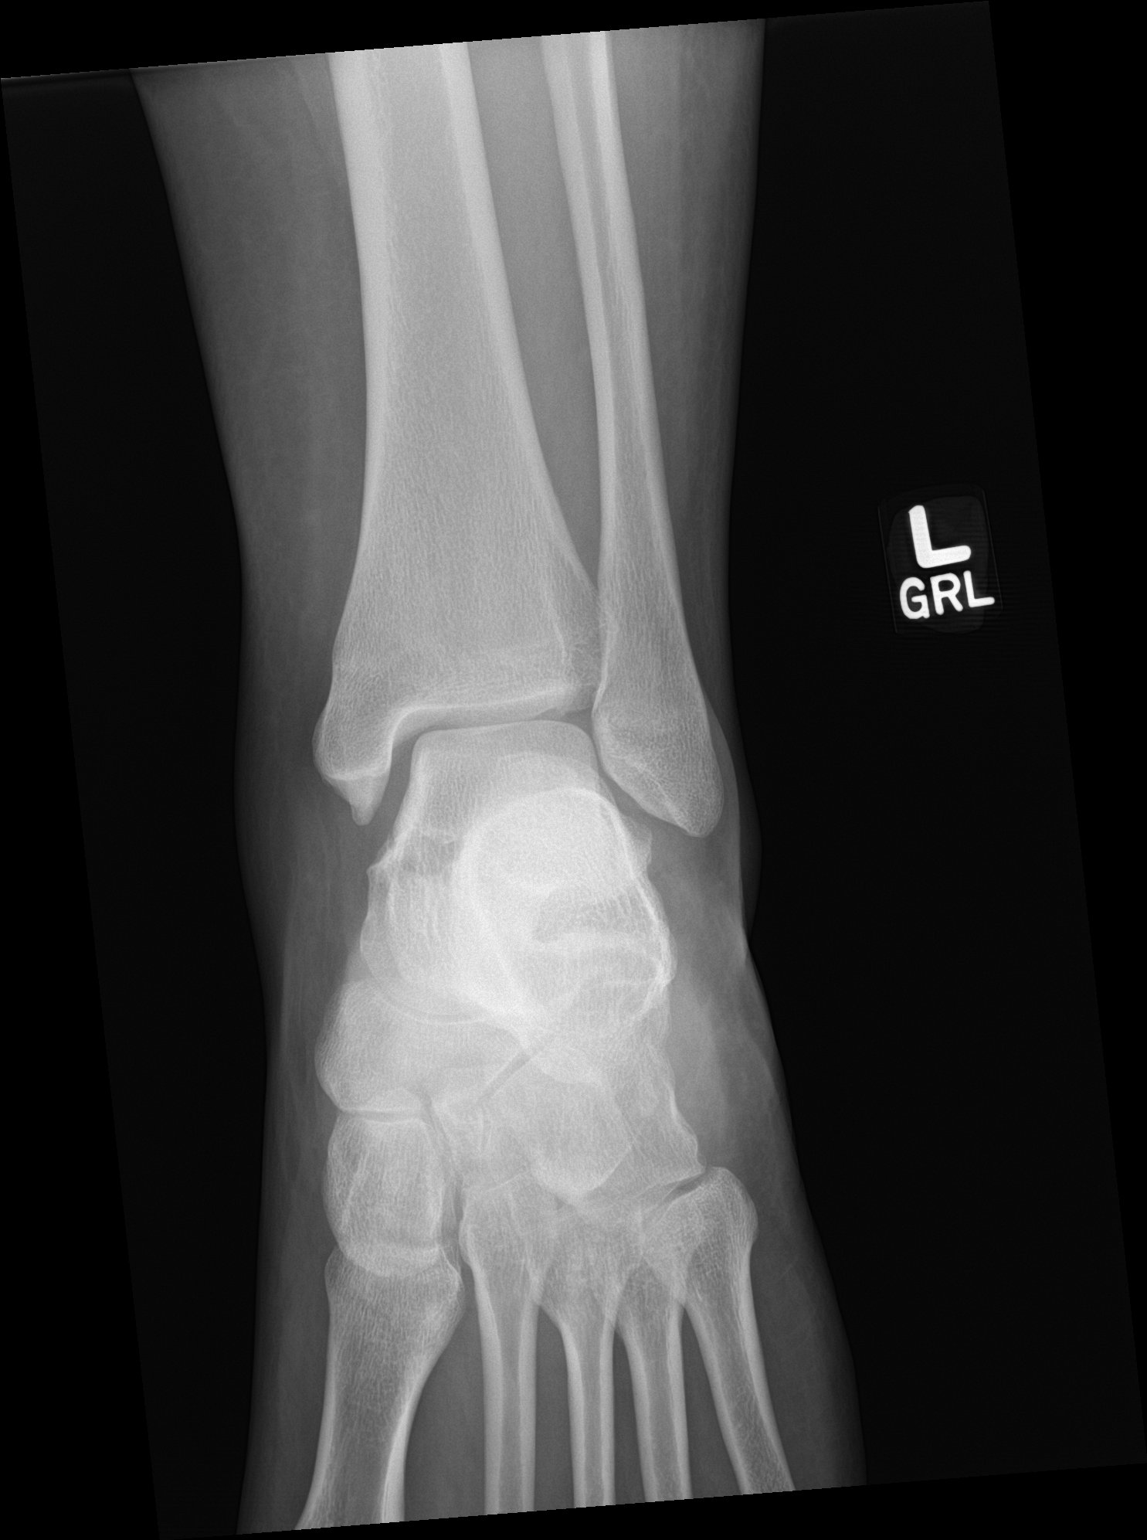

[ankle obl]
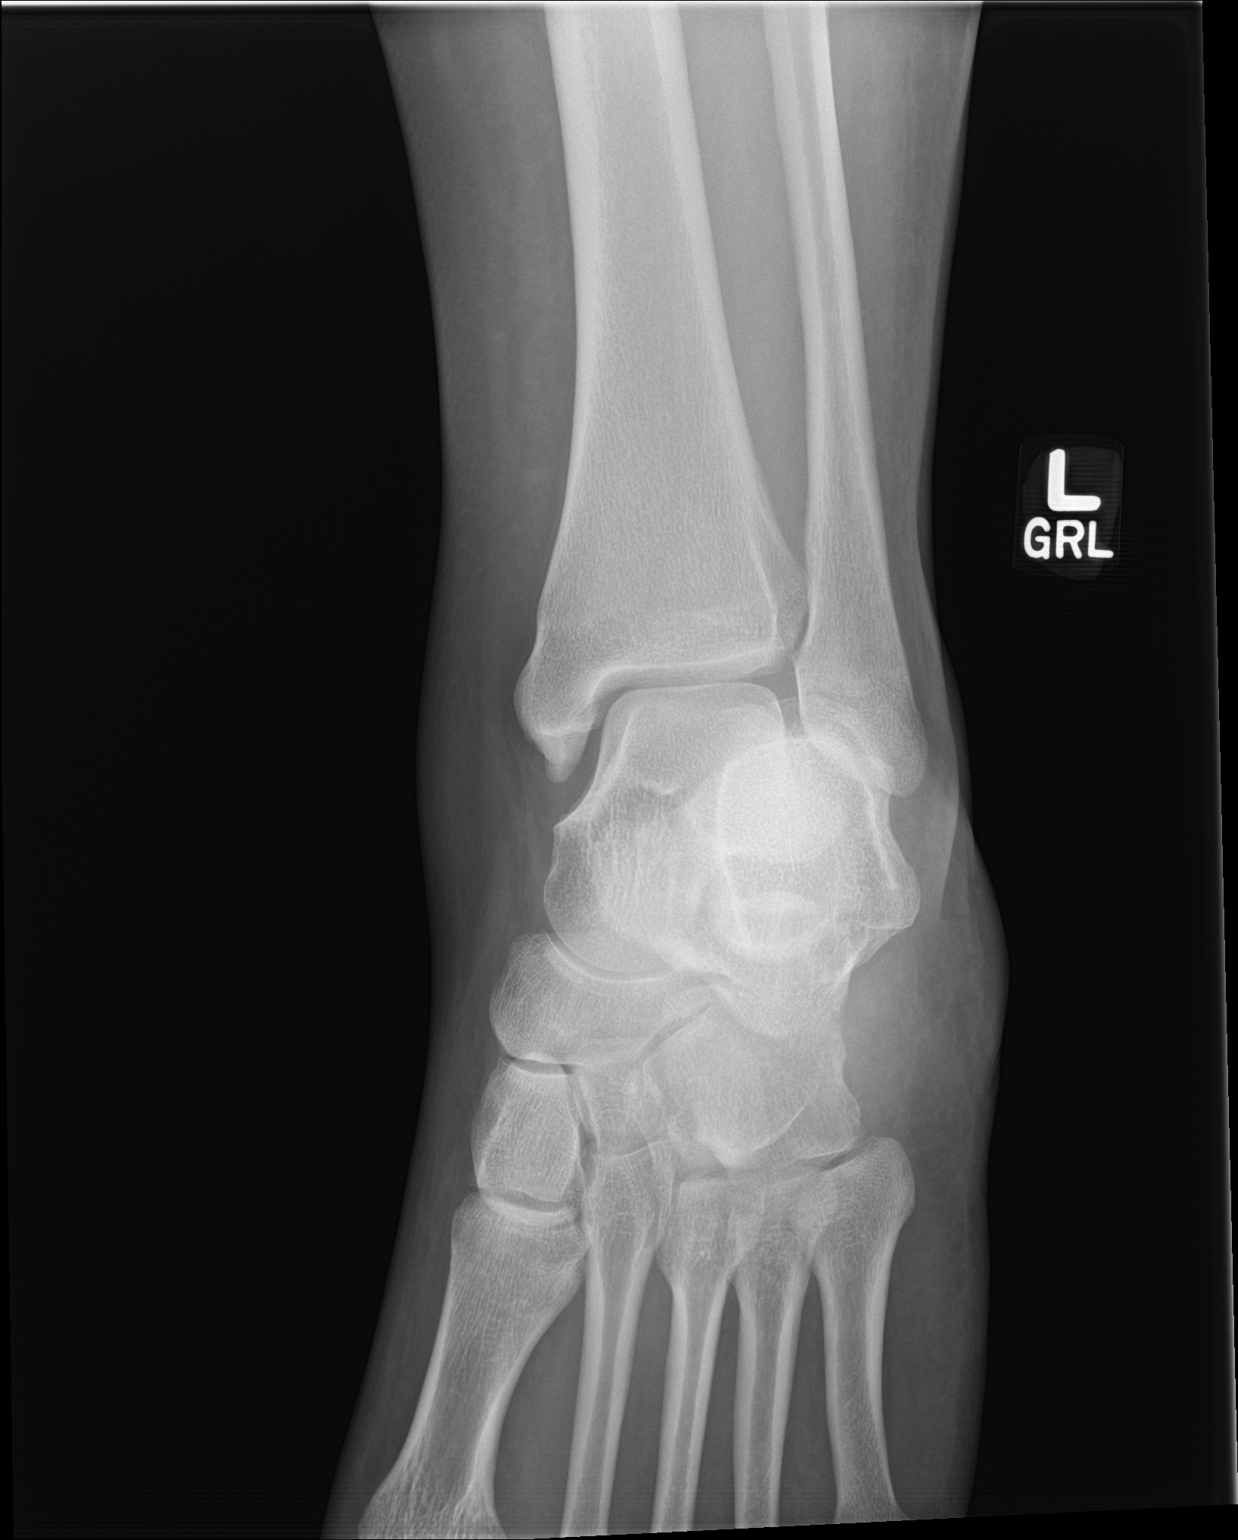

[ankle lat]
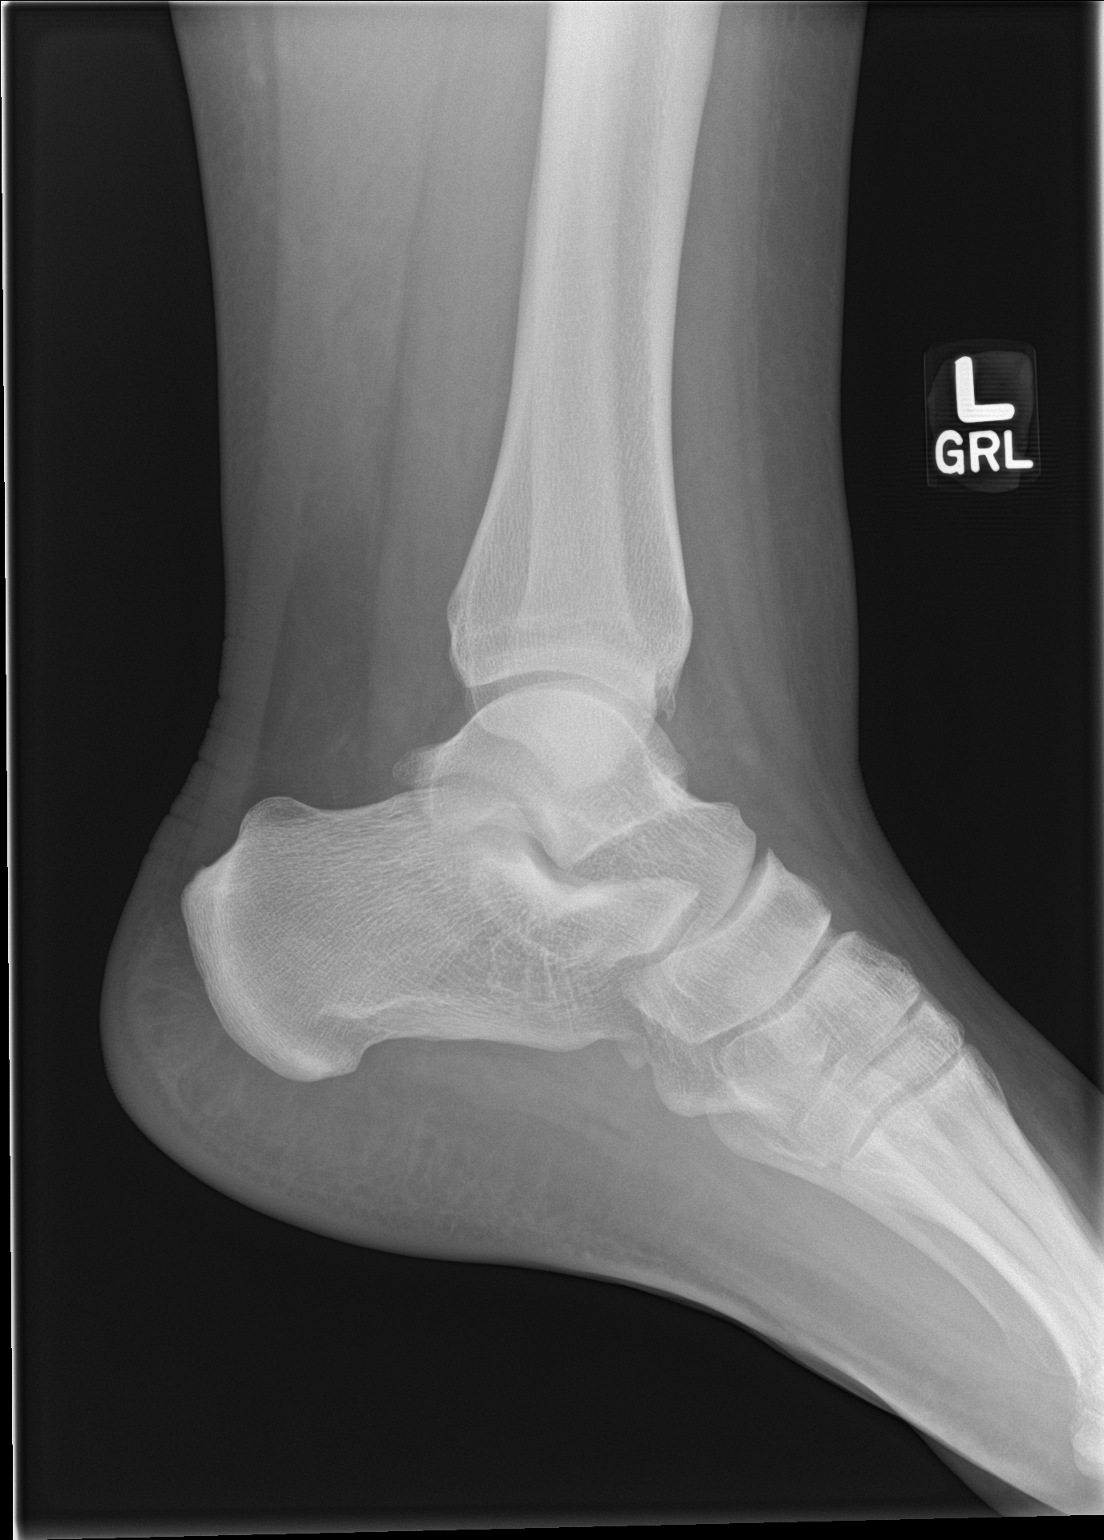

[3 of 3 positions shown; findings below may reference images not displayed]

FINDINGS: There is no evidence of acute fracture, subluxation or dislocation.

Medial soft tissue swelling is noted.

No suspicious focal bony lesions are present.

The joint spaces are unremarkable.
IMPRESSION: Soft tissue swelling without acute bony abnormality.

## 2019-06-03 ENCOUNTER — Encounter: Payer: Self-pay | Admitting: Emergency Medicine

## 2019-06-03 ENCOUNTER — Ambulatory Visit
Admission: EM | Admit: 2019-06-03 | Discharge: 2019-06-03 | Disposition: A | Discharge status: Home / Self Care | Payer: Medicaid Other | Attending: Emergency Medicine | Admitting: Emergency Medicine

## 2019-06-03 ENCOUNTER — Other Ambulatory Visit: Payer: Self-pay

## 2019-06-03 DIAGNOSIS — R109 Unspecified abdominal pain: Secondary | ICD-10-CM | POA: Insufficient documentation

## 2019-06-03 LAB — URINALYSIS, COMPLETE (UACMP) WITH MICROSCOPIC
Bilirubin Urine: NEGATIVE
Glucose, UA: 500 mg/dL — AB
Hgb urine dipstick: NEGATIVE
Ketones, ur: 15 mg/dL — AB
Leukocytes,Ua: NEGATIVE
Nitrite: NEGATIVE
Protein, ur: 30 mg/dL — AB
Specific Gravity, Urine: 1.025 (ref 1.005–1.030)
pH: 6 (ref 5.0–8.0)

## 2019-06-03 LAB — GLUCOSE, CAPILLARY: Glucose-Capillary: 290 mg/dL — ABNORMAL HIGH (ref 70–99)

## 2019-06-03 LAB — PREGNANCY, URINE: Preg Test, Ur: NEGATIVE

## 2019-06-03 NOTE — ED Triage Notes (Signed)
Patient c/o abdominal cramping that started 2 weeks ago. She reports back pain that started today. She has not had a period in 2.5 months.

## 2019-06-03 NOTE — ED Provider Notes (Signed)
HPI  SUBJECTIVE:  Sabrina Blevins is a 24 y.o. female who presents with sharp, crampy lower midline abdominal cramping for the past week and amenorrhea for the past 2-1/2 months.  She has had 1+ home pregnancy test and several negative home pregnancy test.    States the abdominal pain got particularly severe today, lasted approximately 15 minutes.  She reports some nausea and vomiting over the past week however is largely tolerating p.o.  She reports back pain with the abdominal cramping.  No vaginal bleeding, odor, discharge.  No dysuria, urgency, frequency, cloudy odorous urine, hematuria.  No fevers.  She had an normal bowel movement an hour prior to evaluation.  She reports sensitivity to smells but denies any breast tenderness.  She is in a long-term monogamous relationship with a female who is asymptomatic.  STDs are not a concern today.  She has not tried anything for pain.  She states it resolves on its own.  There are no other aggravating or alleviating factors.  She has had symptoms like this before with menses.  She is a G2 ToP1 SAB 1.  She has a history of ectopic pregnancy which was surgically removed, she denies any other abdominal surgeries.  She has a history of UTI, diabetes.  States that she takes a once weekly injection for it.  Does not take any insulin or other oral diabetes medications.  States that her glucose has been running within normal limits for her in the past week.  States that normal range is in the 200s.  She has a history of seizures, bipolar.  No history of DKA, PID, gonorrhea chlamydia HIV HSV syphilis trichomonas BV yeast.  No history of PCOS, ovarian cyst.  LMP: Irregular.  She is not on any birth control.  MGQ:[id number], Jordan Likes, FNP   Past Medical History:  Diagnosis Date  . ADHD   . Airborne allergy, current reaction   . Asthma   . Bipolar 1 disorder ([place])   . Bipolar disorder ([place])   . Cholecystitis   . Morbid obesity ([place])   . Seizures ([place])     Past  Surgical History:  Procedure Laterality Date  . ADENOIDECTOMY    . TONSILLECTOMY      History reviewed. No pertinent family history.  Social History   Tobacco Use  . Smoking status: Never Smoker  . Smokeless tobacco: Never Used  Substance Use Topics  . Alcohol use: Never  . Drug use: Never    No current facility-administered medications for this encounter.  Current Outpatient Medications:  .  hydrOXYzine (ATARAX/VISTARIL) 50 MG tablet, Take 1 tablet (50 mg total) by mouth 3 (three) times daily as needed for anxiety., Disp: 90 tablet, Rfl: 1 .  lamoTRIgine (LAMICTAL) 25 MG tablet, Take 2 tablets (50 mg total) by mouth at bedtime. Until 03/27/2018, 4 tabs for two weeks, then 8 tabs or 200 mg after, Disp: 70 tablet, Rfl: 1 .  prazosin (MINIPRESS) 2 MG capsule, Take 1 capsule (2 mg total) by mouth 2 (two) times daily., Disp: 60 capsule, Rfl: 1 .  traZODone (DESYREL) 100 MG tablet, Take 1 tablet (100 mg total) by mouth at bedtime as needed for sleep., Disp: 30 tablet, Rfl: 1  Allergies  Allergen Reactions  . Bee Venom Anaphylaxis  . Cinnamon Anaphylaxis  . Cinnamon Anaphylaxis  . Contrast Media [Iodinated Diagnostic Agents] Anaphylaxis  . Depakote [Valproic Acid]     Lowers blood glucose quickly   . Motrin [Ibuprofen] Anaphylaxis  .  Nutmeg Oil (Myristica Oil) Anaphylaxis  . Penicillins Anaphylaxis    Has patient had a PCN reaction causing immediate rash, facial/tongue/throat swelling, SOB or lightheadedness with hypotension: Yes Has patient had a PCN reaction causing severe rash involving mucus membranes or skin necrosis: No Has patient had a PCN reaction that required hospitalization: Unknown Has patient had a PCN reaction occurring within the last 10 years: Unknown If all of the above answers are "NO", then may proceed with Cephalosporin use.   Marland Kitchen Penicillins Anaphylaxis  . Benadryl [Diphenhydramine Hcl] Swelling    Pt states reaction is to GENERIC formula only  .  Oxcarbazepine Other (See Comments)    Low bp, "bottoms out"  . Topamax [Topiramate] Other (See Comments)    Drops BP per mother     ROS  As noted in HPI.   Physical Exam  BP 139/79 (BP Location: Left Arm)   Pulse 94   Temp 97.7 F (36.5 C) (Oral)   Resp 18   Ht 5\' 7"  (1.702 m)   Wt (!) 158.8 kg   LMP 04/05/2019   SpO2 100%   BMI 54.82 kg/m   Constitutional: Well developed, well nourished, no acute distress Eyes:  EOMI, conjunctiva normal bilaterally HENT: Normocephalic, atraumatic,mucus membranes moist Respiratory: Normal inspiratory effort Cardiovascular: Normal rate GI: nondistended soft nontender nondistended.  No guarding, rebound.  No suprapubic tenderness.  No right lower quadrant, left lower quadrant tenderness. Back: No CVAT skin: No rash, skin intact Musculoskeletal: no deformities Neurologic: Alert & oriented x 3, no focal neuro deficits Psychiatric: Speech and behavior appropriate   ED Course   Medications - No data to display  Orders Placed This Encounter  Procedures  . Urine culture    Standing Status:   Standing    Number of Occurrences:   1  . Urinalysis, Complete w Microscopic    Standing Status:   Standing    Number of Occurrences:   1  . Pregnancy, urine    Standing Status:   Standing    Number of Occurrences:   1  . Glucose, capillary    Standing Status:   Standing    Number of Occurrences:   1  . CBG monitoring, ED    Standing Status:   Standing    Number of Occurrences:   1    Results for orders placed or performed during the hospital encounter of 06/03/19 (from the past 24 hour(s))  Urinalysis, Complete w Microscopic     Status: Abnormal   Collection Time: 06/03/19  8:14 PM  Result Value Ref Range   Color, Urine YELLOW YELLOW   APPearance HAZY (A) CLEAR   Specific Gravity, Urine 1.025 1.005 - 1.030   pH 6.0 5.0 - 8.0   Glucose, UA 500 (A) NEGATIVE mg/dL   Hgb urine dipstick NEGATIVE NEGATIVE   Bilirubin Urine NEGATIVE  NEGATIVE   Ketones, ur 15 (A) NEGATIVE mg/dL   Protein, ur 30 (A) NEGATIVE mg/dL   Nitrite NEGATIVE NEGATIVE   Leukocytes,Ua NEGATIVE NEGATIVE   Squamous Epithelial / LPF 0-5 0 - 5   WBC, UA 0-5 0 - 5 WBC/hpf   RBC / HPF 0-5 0 - 5 RBC/hpf   Bacteria, UA FEW (A) NONE SEEN  Pregnancy, urine     Status: None   Collection Time: 06/03/19  8:14 PM  Result Value Ref Range   Preg Test, Ur NEGATIVE NEGATIVE  Glucose, capillary     Status: Abnormal   Collection Time: 06/03/19  8:39  PM  Result Value Ref Range   Glucose-Capillary 290 (H) 70 - 99 mg/dL   No results found.  ED Clinical Impression  1. Abdominal cramping      ED Assessment/Plan  Patient is not pregnant.  She has a few bacteria in her urine, some ketones and 500 glucose.  Fingerstick was 290 which patient states is within normal range for her.  Doubt DKA.  Her primary concern today is possible ovarian cyst.  Location of the pain is not entirely consistent with an ovarian cyst as it is midline, not in the lower quadrants.  She has no tenderness in the low abdomen at all.  STDs are absolutely not a concern today.  Feel that patient is low risk for PID.  She has no vaginal complaints.  No history of STDs BV or yeast, so further testing was not done tonight.  No evidence of appendicitis, obstruction, perforation, colitis.  Sending home with 1000 mg Tylenol 3-4 times a day.  Patient reports anaphylaxis with NSAIDs.  This could be menses coming on.  However given duration of symptoms, if it does not respond to Tylenol, advised her that she will need to have further work-up which I think can be safely done as an outpatient by her PMD.  Discussed labs,MDM, treatment plan, and plan for follow-up with patient. Discussed sn/sx that should prompt return to the ED. patient agrees with plan.   No orders of the defined types were placed in this encounter.   *This clinic note was created using Dragon dictation software. Therefore, there may be  occasional mistakes despite careful proofreading.   ?    Domenick Gong, MD 06/03/19 2056

## 2019-06-03 NOTE — Discharge Instructions (Addendum)
I am sending your urine off for culture to make absolutely sure that you do not have a urinary tract infection.  We will contact you and call in the appropriate antibiotics if this is the case.  1000 milligrams of Tylenol 3-4 times a day as needed for pain.  Warm compresses or heating pad.  Follow-up with your primary care physician or OB/GYN if not better in 3 days.

## 2019-06-05 LAB — URINE CULTURE: Culture: NO GROWTH

## 2019-09-01 ENCOUNTER — Ambulatory Visit
Admission: EM | Admit: 2019-09-01 | Discharge: 2019-09-01 | Disposition: A | Discharge status: Home / Self Care | Payer: Medicaid Other | Attending: Family Medicine | Admitting: Family Medicine

## 2019-09-01 ENCOUNTER — Other Ambulatory Visit: Payer: Self-pay

## 2019-09-01 ENCOUNTER — Encounter: Payer: Self-pay | Admitting: Emergency Medicine

## 2019-09-01 DIAGNOSIS — S0086XA Insect bite (nonvenomous) of other part of head, initial encounter: Secondary | ICD-10-CM

## 2019-09-01 DIAGNOSIS — W57XXXA Bitten or stung by nonvenomous insect and other nonvenomous arthropods, initial encounter: Secondary | ICD-10-CM

## 2019-09-01 MED ORDER — TRIAMCINOLONE ACETONIDE 0.025 % EX OINT: 1.0000 "application " | TOPICAL_OINTMENT | Freq: Two times a day (BID) | CUTANEOUS | 0 refills | Status: DC

## 2019-09-01 NOTE — ED Triage Notes (Signed)
Patient states she woke up with a bite on the left side of her face.

## 2019-09-01 NOTE — ED Provider Notes (Signed)
[facility] ____________________________________________  Time seen: Approximately 10:26 AM  I have reviewed the triage vital signs and the nursing notes.   HISTORY  Chief Complaint Insect Bite   HPI Sabrina Blevins is a 24 y.o. female presenting for evaluation of skin changes to left face.  States she thinks that she got bit by insect.  States she has 2 areas to the side of her face that are itchy and burning feeling.  Denies known trigger.  States present upon awakening this morning.  Denies any other skin changes or other insect bites.  Denies fevers.  Denies any oral swelling, difficulty swallowing or breathing, chest pain, shortness of breath, paresthesias, fevers or other complaints.  Did apply a cool compress earlier which helped.  Denies other aggravating alleviating factors.  Armando Gang, FNP : PCP Patient's last menstrual period was 08/18/2019. Denies pregnancy.  Past Medical History:  Diagnosis Date   ADHD    Airborne allergy, current reaction    Asthma    Bipolar 1 disorder (HCC)    Bipolar disorder (HCC)    Cholecystitis    Morbid obesity (HCC)    Seizures (HCC)     Patient Active Problem List   Diagnosis Date Noted   PTSD (post-traumatic stress disorder) 03/13/2018   UTI (urinary tract infection) 03/13/2018   Noncompliance with treatment 03/13/2018   Major depressive disorder, recurrent episode with mixed features (HCC) 06/03/2017   Suicidal ideation 06/02/2017    Past Surgical History:  Procedure Laterality Date   ADENOIDECTOMY     TONSILLECTOMY       No current facility-administered medications for this encounter.  Current Outpatient Medications:    hydrOXYzine (ATARAX/VISTARIL) 50 MG tablet, Take 1 tablet (50 mg total) by mouth 3 (three) times daily as needed for anxiety., Disp: 90 tablet, Rfl: 1   lamoTRIgine (LAMICTAL) 25 MG tablet, Take 2 tablets (50 mg total) by mouth at bedtime. Until 03/27/2018, 4 tabs  for two weeks, then 8 tabs or 200 mg after, Disp: 70 tablet, Rfl: 1   prazosin (MINIPRESS) 2 MG capsule, Take 1 capsule (2 mg total) by mouth 2 (two) times daily., Disp: 60 capsule, Rfl: 1   traZODone (DESYREL) 100 MG tablet, Take 1 tablet (100 mg total) by mouth at bedtime as needed for sleep., Disp: 30 tablet, Rfl: 1   triamcinolone (KENALOG) 0.025 % ointment, Apply 1 application topically 2 (two) times daily. For 5 days, Disp: 30 g, Rfl: 0  Allergies Bee venom, Cinnamon, Cinnamon, Contrast media [iodinated diagnostic agents], Depakote [valproic acid], Motrin [ibuprofen], Nutmeg oil (myristica oil), Penicillins, Penicillins, Benadryl [diphenhydramine hcl], Oxcarbazepine, and Topamax [topiramate]  History reviewed. No pertinent family history.  Social History Social History   Tobacco Use   Smoking status: Never Smoker   Smokeless tobacco: Never Used  Building services engineer Use: Never used  Substance Use Topics   Alcohol use: Never   Drug use: Never    Review of Systems Constitutional: No fever ENT: No sore throat. Cardiovascular: Denies chest pain. Respiratory: Denies shortness of breath. Skin: Positive skin changes.   ____________________________________________   PHYSICAL EXAM:  VITAL SIGNS: ED Triage Vitals  Enc Vitals Group     BP 09/01/19 0926 (!) 120/91     Pulse Rate 09/01/19 0926 (!) 107     Resp 09/01/19 0926 18     Temp 09/01/19 0926 99.2 F (37.3 C)     Temp Source 09/01/19 0926 Oral     SpO2 09/01/19  0926 99 %     Weight 09/01/19 0924 (!) 350 lb 1.5 oz (158.8 kg)     Height 09/01/19 0924 5\' 7"  (1.702 m)     Head Circumference --      Peak Flow --      Pain Score 09/01/19 0924 5     Pain Loc --      Pain Edu? --      Excl. in GC? --     Constitutional: Alert and oriented. Well appearing and in no acute distress. Eyes: Conjunctivae are normal.  ENT      Head: see skin below.      Nose: No congestion/rhinnorhea.      Mouth/Throat: Mucous  membranes are moist.Oropharynx non-erythematous.  No lip, tongue or oropharyngeal edema noted. Neck: No stridor. Supple without meningismus.  Hematological/Lymphatic/Immunilogical: No cervical lymphadenopathy. Cardiovascular:Good peripheral circulation. Respiratory: Normal respiratory effort without tachypnea nor retractions.  Musculoskeletal: Steady gait.  Neurologic:  Normal speech and language.  Skin:  Skin is warm, dry. Except: Left lateral face to less than 1 cm circular mildly erythematous papules without fluctuance, no drainage, no surrounding erythema, minimal tenderness to direct palpation, no other skin changes noted. Psychiatric: Mood and affect are normal. Speech and behavior are normal. Patient exhibits appropriate insight and judgment   ___________________________________________   LABS (all labs ordered are listed, but only abnormal results are displayed)  Labs Reviewed - No data to display ____________________________________________  PROCEDURES Procedures    INITIAL IMPRESSION / ASSESSMENT AND PLAN / ED COURSE  Pertinent labs & imaging results that were available during my care of the patient were reviewed by me and considered in my medical decision making (see chart for details).  Well-appearing patient.  No acute distress.  Left face skin changes as noted above.  Possible insect bites.  Does not appear to have secondary infection.  Supportive care, topical triamcinolone and cool compresses. Discussed indication, risks and benefits of medications with patient.   Discussed follow up with Primary care physician this week. Discussed follow up and return parameters including no resolution or any worsening concerns. Patient verbalized understanding and agreed to plan.   ____________________________________________   FINAL CLINICAL IMPRESSION(S) / ED DIAGNOSES  Final diagnoses:  Insect bite of face, initial encounter     ED Discharge Orders         Ordered     triamcinolone (KENALOG) 0.025 % ointment  2 times daily     Discontinue  Reprint     09/01/19 0951           Note: This dictation was prepared with Dragon dictation along with smaller phrase technology. Any transcriptional errors that result from this process are unintentional.         09/03/19, NP 09/01/19 1049

## 2019-09-01 NOTE — Discharge Instructions (Addendum)
Take medication as prescribed.  Cool compress.  Over-the-counter Claritin or Zyrtec.  Monitor.  Follow up with your primary care physician this week as needed. Return to Urgent care for new or worsening concerns.

## 2019-10-16 ENCOUNTER — Other Ambulatory Visit: Payer: Self-pay

## 2019-10-16 ENCOUNTER — Emergency Department
Admission: EM | Admit: 2019-10-16 | Discharge: 2019-10-16 | Disposition: A | Discharge status: Home / Self Care | Payer: Medicaid Other | Attending: Emergency Medicine | Admitting: Emergency Medicine

## 2019-10-16 DIAGNOSIS — R2232 Localized swelling, mass and lump, left upper limb: Secondary | ICD-10-CM | POA: Diagnosis not present

## 2019-10-16 DIAGNOSIS — Y939 Activity, unspecified: Secondary | ICD-10-CM | POA: Insufficient documentation

## 2019-10-16 DIAGNOSIS — Z5321 Procedure and treatment not carried out due to patient leaving prior to being seen by health care provider: Secondary | ICD-10-CM | POA: Diagnosis not present

## 2019-10-16 DIAGNOSIS — W57XXXA Bitten or stung by nonvenomous insect and other nonvenomous arthropods, initial encounter: Secondary | ICD-10-CM | POA: Diagnosis not present

## 2019-10-16 DIAGNOSIS — S50862A Insect bite (nonvenomous) of left forearm, initial encounter: Secondary | ICD-10-CM | POA: Diagnosis not present

## 2019-10-16 DIAGNOSIS — Y999 Unspecified external cause status: Secondary | ICD-10-CM | POA: Insufficient documentation

## 2019-10-16 DIAGNOSIS — Y929 Unspecified place or not applicable: Secondary | ICD-10-CM | POA: Diagnosis not present

## 2019-10-16 LAB — COMPREHENSIVE METABOLIC PANEL
ALT: 30 U/L (ref 0–44)
AST: 27 U/L (ref 15–41)
Albumin: 4.1 g/dL (ref 3.5–5.0)
Alkaline Phosphatase: 73 U/L (ref 38–126)
Anion gap: 8 (ref 5–15)
BUN: 8 mg/dL (ref 6–20)
CO2: 26 mmol/L (ref 22–32)
Calcium: 9 mg/dL (ref 8.9–10.3)
Chloride: 102 mmol/L (ref 98–111)
Creatinine, Ser: 0.72 mg/dL (ref 0.44–1.00)
GFR calc Af Amer: >60 mL/min (ref >60)
GFR calc non Af Amer: >60 mL/min (ref >60)
Glucose, Bld: 244 mg/dL — ABNORMAL HIGH (ref 70–99)
Potassium: 4.1 mmol/L (ref 3.5–5.1)
Sodium: 136 mmol/L (ref 135–145)
Total Bilirubin: 0.9 mg/dL (ref 0.3–1.2)
Total Protein: 7.5 g/dL (ref 6.5–8.1)

## 2019-10-16 LAB — CBC WITH DIFFERENTIAL/PLATELET
Abs Immature Granulocytes: 0.41 10*3/uL — ABNORMAL HIGH (ref 0.00–0.07)
Basophils Absolute: 0.1 10*3/uL (ref 0.0–0.1)
Basophils Relative: 1 %
Eosinophils Absolute: 0.3 10*3/uL (ref 0.0–0.5)
Eosinophils Relative: 2 %
HCT: 39.7 % (ref 36.0–46.0)
Hemoglobin: 13.7 g/dL (ref 12.0–15.0)
Immature Granulocytes: 2 %
Lymphocytes Relative: 15 %
Lymphs Abs: 3.1 10*3/uL (ref 0.7–4.0)
MCH: 28.5 pg (ref 26.0–34.0)
MCHC: 34.5 g/dL (ref 30.0–36.0)
MCV: 82.7 fL (ref 80.0–100.0)
Monocytes Absolute: 1.5 10*3/uL — ABNORMAL HIGH (ref 0.1–1.0)
Monocytes Relative: 7 %
Neutro Abs: 15.5 10*3/uL — ABNORMAL HIGH (ref 1.7–7.7)
Neutrophils Relative %: 73 %
Platelets: 327 10*3/uL (ref 150–400)
RBC: 4.8 MIL/uL (ref 3.87–5.11)
RDW: 13.6 % (ref 11.5–15.5)
WBC: 21 10*3/uL — ABNORMAL HIGH (ref 4.0–10.5)
nRBC: 0.1 % (ref 0.0–0.2)

## 2019-10-16 NOTE — ED Triage Notes (Signed)
First Nurse: patient brought in by ems from home. Patient was bit by an insect two days ago on her left forearm. Patient with redness and swelling to left forearm.

## 2019-10-16 NOTE — ED Notes (Signed)
Called   No answer in lobby  

## 2019-10-16 NOTE — ED Notes (Signed)
Pt called to be roomed, no answer. Pt not in lobby or outside.

## 2019-10-16 NOTE — ED Notes (Signed)
Pt called for vital sign recheck. No answer, pt not visualized in lobby or outside.

## 2019-10-16 NOTE — ED Triage Notes (Signed)
Patient with bite to left forearm 2 days ago.  Patient reports area drained on its own and large amount of redness.

## 2020-02-23 ENCOUNTER — Other Ambulatory Visit: Payer: Self-pay

## 2020-02-23 ENCOUNTER — Emergency Department: Payer: Medicaid Other

## 2020-02-23 ENCOUNTER — Encounter: Payer: Self-pay | Admitting: *Deleted

## 2020-02-23 ENCOUNTER — Emergency Department
Admission: EM | Admit: 2020-02-23 | Discharge: 2020-02-24 | Disposition: A | Discharge status: Home / Self Care | Payer: Medicaid Other | Attending: Emergency Medicine | Admitting: Emergency Medicine

## 2020-02-23 DIAGNOSIS — R61 Generalized hyperhidrosis: Secondary | ICD-10-CM | POA: Insufficient documentation

## 2020-02-23 DIAGNOSIS — R0789 Other chest pain: Secondary | ICD-10-CM | POA: Insufficient documentation

## 2020-02-23 DIAGNOSIS — Z5321 Procedure and treatment not carried out due to patient leaving prior to being seen by health care provider: Secondary | ICD-10-CM | POA: Insufficient documentation

## 2020-02-23 DIAGNOSIS — R220 Localized swelling, mass and lump, head: Secondary | ICD-10-CM | POA: Diagnosis not present

## 2020-02-23 LAB — BASIC METABOLIC PANEL
Anion gap: 15 (ref 5–15)
BUN: 10 mg/dL (ref 6–20)
CO2: 26 mmol/L (ref 22–32)
Calcium: 9.7 mg/dL (ref 8.9–10.3)
Chloride: 96 mmol/L — ABNORMAL LOW (ref 98–111)
Creatinine, Ser: 0.67 mg/dL (ref 0.44–1.00)
GFR, Estimated: >60 mL/min (ref >60)
Glucose, Bld: 244 mg/dL — ABNORMAL HIGH (ref 70–99)
Potassium: 3.8 mmol/L (ref 3.5–5.1)
Sodium: 137 mmol/L (ref 135–145)

## 2020-02-23 LAB — CBC
HCT: 43.5 % (ref 36.0–46.0)
Hemoglobin: 14.3 g/dL (ref 12.0–15.0)
MCH: 27.7 pg (ref 26.0–34.0)
MCHC: 32.9 g/dL (ref 30.0–36.0)
MCV: 84.1 fL (ref 80.0–100.0)
Platelets: 359 10*3/uL (ref 150–400)
RBC: 5.17 MIL/uL — ABNORMAL HIGH (ref 3.87–5.11)
RDW: 13.3 % (ref 11.5–15.5)
WBC: 18.6 10*3/uL — ABNORMAL HIGH (ref 4.0–10.5)
nRBC: 0 % (ref 0.0–0.2)

## 2020-02-23 LAB — TROPONIN I (HIGH SENSITIVITY): Troponin I (High Sensitivity): 5 ng/L (ref <18)

## 2020-02-23 NOTE — ED Triage Notes (Signed)
Pt to ED reporting she was prescribed Sumatriptan was migraines and took her first dose today at 1800. Since then pt reported having lip swelling at home that she took 50mg  of Benadryl. Lip swelling has improved since taking medication and in triage room no swelling is noted at this time. PT reporting now she is having worsening chest tightness and diaphoresis. Pt also reports her headache has worsened since taking the medication.

## 2020-02-24 NOTE — ED Notes (Signed)
No answer when called several times from lobby 

## 2020-04-21 ENCOUNTER — Other Ambulatory Visit: Payer: Self-pay

## 2020-04-21 ENCOUNTER — Emergency Department
Admission: EM | Admit: 2020-04-21 | Discharge: 2020-04-21 | Discharge status: Against Medical Advice | Payer: Medicaid Other

## 2020-06-01 ENCOUNTER — Emergency Department: Payer: Medicaid Other

## 2020-06-01 ENCOUNTER — Emergency Department
Admission: EM | Admit: 2020-06-01 | Discharge: 2020-06-01 | Disposition: A | Discharge status: Home / Self Care | Payer: Medicaid Other | Attending: Emergency Medicine | Admitting: Emergency Medicine

## 2020-06-01 ENCOUNTER — Other Ambulatory Visit: Payer: Self-pay

## 2020-06-01 DIAGNOSIS — R519 Headache, unspecified: Secondary | ICD-10-CM | POA: Insufficient documentation

## 2020-06-01 DIAGNOSIS — R0789 Other chest pain: Secondary | ICD-10-CM | POA: Diagnosis not present

## 2020-06-01 DIAGNOSIS — J45909 Unspecified asthma, uncomplicated: Secondary | ICD-10-CM | POA: Insufficient documentation

## 2020-06-01 LAB — BASIC METABOLIC PANEL
Anion gap: 11 (ref 5–15)
BUN: 9 mg/dL (ref 6–20)
CO2: 23 mmol/L (ref 22–32)
Calcium: 8.9 mg/dL (ref 8.9–10.3)
Chloride: 103 mmol/L (ref 98–111)
Creatinine, Ser: 0.58 mg/dL (ref 0.44–1.00)
GFR, Estimated: >60 mL/min (ref >60)
Glucose, Bld: 218 mg/dL — ABNORMAL HIGH (ref 70–99)
Potassium: 4 mmol/L (ref 3.5–5.1)
Sodium: 137 mmol/L (ref 135–145)

## 2020-06-01 LAB — CBG MONITORING, ED: Glucose-Capillary: 204 mg/dL — ABNORMAL HIGH (ref 70–99)

## 2020-06-01 LAB — CBC
HCT: 40.6 % (ref 36.0–46.0)
Hemoglobin: 13.7 g/dL (ref 12.0–15.0)
MCH: 27.9 pg (ref 26.0–34.0)
MCHC: 33.7 g/dL (ref 30.0–36.0)
MCV: 82.7 fL (ref 80.0–100.0)
Platelets: 325 10*3/uL (ref 150–400)
RBC: 4.91 MIL/uL (ref 3.87–5.11)
RDW: 13.3 % (ref 11.5–15.5)
WBC: 12.6 10*3/uL — ABNORMAL HIGH (ref 4.0–10.5)
nRBC: 0 % (ref 0.0–0.2)

## 2020-06-01 LAB — TROPONIN I (HIGH SENSITIVITY): Troponin I (High Sensitivity): 3 ng/L (ref <18)

## 2020-06-01 MED ORDER — LACTATED RINGERS IV BOLUS
1000.0000 mL | Freq: Once | INTRAVENOUS | Status: AC
  Administered 2020-06-01: 1000 mL via INTRAVENOUS

## 2020-06-01 MED ORDER — DROPERIDOL 2.5 MG/ML IJ SOLN
2.5000 mg | Freq: Once | INTRAMUSCULAR | Status: AC
  Administered 2020-06-01: 2.5 mg via INTRAVENOUS
  Filled 2020-06-01: qty 2

## 2020-06-01 MED ORDER — ACETAMINOPHEN 500 MG PO TABS
1000.0000 mg | ORAL_TABLET | Freq: Once | ORAL | Status: AC
  Administered 2020-06-01: 1000 mg via ORAL
  Filled 2020-06-01: qty 2

## 2020-06-01 NOTE — ED Notes (Signed)
cbg 204 

## 2020-06-01 NOTE — ED Provider Notes (Signed)
[facility] Provider Note ____________________________________________   Event Date/Time   First MD Initiated Contact with Patient 06/01/20 1114     (approximate)  I have reviewed the triage vital signs and the nursing notes.  HISTORY  Chief Complaint Chest Pain   HPI Sabrina Blevins is a 25 y.o. femalewho presents to the ED for evaluation of chest pain and headache  Chart review indicates morbid obesity. Frequent ED visits for abd pain. Bipolar disorder. Asthma.   Patient presents to the ED for evaluation of chest pain and headache that started this morning.  She reports feeling normal yesterday, but feeling weak and generalized fashion this morning.  She reports noting a high glucose at home with CBG of about 380.  She was associated global headache that is atraumatic since that time, and after seeing her blood glucose, she reports increasing chest pain as she "was trying to calm myself down."  Denies any recent illnesses, denies shortness of breath, cough, syncopal episode, abdominal pain, emesis, diarrhea, dysuria.  She reports she took some Tylenol at home and feels better now regarding her chest pain, but reports persistent headache.   Past Medical History:  Diagnosis Date  . ADHD   . Airborne allergy, current reaction   . Asthma   . Bipolar 1 disorder (HCC)   . Bipolar disorder (HCC)   . Cholecystitis   . Morbid obesity (HCC)   . Seizures [facility])     Patient Active Problem List   Diagnosis Date Noted  . PTSD (post-traumatic stress disorder) 03/13/2018  . UTI (urinary tract infection) 03/13/2018  . Noncompliance with treatment 03/13/2018  . Major depressive disorder, recurrent episode with mixed features (HCC) 06/03/2017  . Suicidal ideation 06/02/2017    Past Surgical History:  Procedure Laterality Date  . ADENOIDECTOMY    . TONSILLECTOMY      Prior to Admission medications   Medication Sig Start Date End  Date Taking? Authorizing Provider  hydrOXYzine (ATARAX/VISTARIL) 50 MG tablet Take 1 tablet (50 mg total) by mouth 3 (three) times daily as needed for anxiety. 03/16/18   Pucilowska, Braulio Conte B, MD  lamoTRIgine (LAMICTAL) 25 MG tablet Take 2 tablets (50 mg total) by mouth at bedtime. Until 03/27/2018, 4 tabs for two weeks, then 8 tabs or 200 mg after 03/16/18   Pucilowska, Jolanta B, MD  prazosin (MINIPRESS) 2 MG capsule Take 1 capsule (2 mg total) by mouth 2 (two) times daily. 03/16/18   Pucilowska, Braulio Conte B, MD  traZODone (DESYREL) 100 MG tablet Take 1 tablet (100 mg total) by mouth at bedtime as needed for sleep. 03/16/18   Pucilowska, Jolanta B, MD  triamcinolone (KENALOG) 0.025 % ointment Apply 1 application topically 2 (two) times daily. For 5 days 09/01/19   Renford Dills, NP    Allergies Bee venom, Cinnamon, Cinnamon, Contrast media [iodinated diagnostic agents], Depakote [valproic acid], Motrin [ibuprofen], Nutmeg oil (myristica oil), Penicillins, Penicillins, Benadryl [diphenhydramine hcl], Oxcarbazepine, and Topamax [topiramate]  No family history on file.  Social History Social History   Tobacco Use  . Smoking status: Never Smoker  . Smokeless tobacco: Never Used  Vaping Use  . Vaping Use: Never used  Substance Use Topics  . Alcohol use: Never  . Drug use: Never    Review of Systems  Constitutional: No fever/chills Eyes: No visual changes. ENT: No sore throat. Cardiovascular: Positive for chest pain. Respiratory: Denies shortness of breath. Gastrointestinal: No abdominal pain.  No nausea, no vomiting.  No  diarrhea.  No constipation. Genitourinary: Negative for dysuria. Musculoskeletal: Negative for back pain. Skin: Negative for rash. Neurological: Negative for focal weakness or numbness.  Positive for headache  ____________________________________________   PHYSICAL EXAM:  VITAL SIGNS: Vitals:   06/01/20 1125  BP: 135/84  Pulse: 75  Resp: (!) 22  Temp: 98 F  (36.7 C)  SpO2: 99%     Constitutional: Alert and oriented.  Morbidly obese.  Sitting upright in bed and conversational full sentences.  Becomes tearful when discussing her chest pain. Eyes: Conjunctivae are normal. PERRL. EOMI. Head: Atraumatic. Nose: No congestion/rhinnorhea. Mouth/Throat: Mucous membranes are moist.  Oropharynx non-erythematous. Neck: No stridor. No cervical spine tenderness to palpation. Cardiovascular: Normal rate, regular rhythm. Grossly normal heart sounds.  Good peripheral circulation. Respiratory: Normal respiratory effort.  No retractions. Lungs CTAB. Gastrointestinal: Soft , nondistended, nontender to palpation. No CVA tenderness. Musculoskeletal: No lower extremity tenderness nor edema.  No joint effusions. No signs of acute trauma. Walking boot on her right leg.  She reports twisting her ankle yesterday.  Neurologic:  Normal speech and language. No gross focal neurologic deficits are appreciated. No gait instability noted. Cranial nerves II through XII intact 5/5 strength and sensation in all 4 extremities Skin:  Skin is warm, dry and intact. No rash noted. Psychiatric: Mood and affect are normal. Speech and behavior are normal. ____________________________________________   LABS (all labs ordered are listed, but only abnormal results are displayed)  Labs Reviewed  BASIC METABOLIC PANEL - Abnormal; Notable for the following components:      Result Value   Glucose, Bld 218 (*)    All other components within normal limits  CBC - Abnormal; Notable for the following components:   WBC 12.6 (*)    All other components within normal limits  CBG MONITORING, ED - Abnormal; Notable for the following components:   Glucose-Capillary 204 (*)    All other components within normal limits  POC URINE PREG, ED  TROPONIN I (HIGH SENSITIVITY)  TROPONIN I (HIGH SENSITIVITY)   ____________________________________________  12 Lead EKG  Sinus rhythm, rate of 70 bpm.   Normal axis and intervals.  No evidence of acute ischemia. ____________________________________________  RADIOLOGY  ED MD interpretation: 2 view CXR reviewed by me without evidence of acute cardiopulmonary pathology.  Official radiology report(s): DG Chest 2 View  Result Date: 06/01/2020 CLINICAL DATA:  Chest pain and weakness EXAM: CHEST - 2 VIEW COMPARISON:  02/23/2020 FINDINGS: Upper normal heart size. Mediastinal contours and pulmonary vascularity normal. Lungs clear. No pulmonary infiltrate, pleural effusion, or pneumothorax. Osseous structures unremarkable. IMPRESSION: No acute abnormalities. Electronically Signed   By: Ulyses Southward M.D.   On: 06/01/2020 11:54    ____________________________________________   PROCEDURES and INTERVENTIONS  Procedure(s) performed (including Critical Care):  .1-3 Lead EKG Interpretation Performed by: Delton Prairie, MD Authorized by: Delton Prairie, MD     Interpretation: normal     ECG rate:  70   ECG rate assessment: normal     Rhythm: sinus rhythm     Ectopy: none     Conduction: normal      Medications  lactated ringers bolus 1,000 mL (1,000 mLs Intravenous New Bag/Given 06/01/20 1231)  droperidol (INAPSINE) 2.5 MG/ML injection 2.5 mg (2.5 mg Intravenous Given 06/01/20 1230)  acetaminophen (TYLENOL) tablet 1,000 mg (1,000 mg Oral Given 06/01/20 1229)    ____________________________________________   MDM / ED COURSE   24 year old female low risk for ACS presents to the ED with atypical chest pains  and atraumatic headache, amenable to symptomatic control and outpatient management.  Normal vitals on room air.  Exam demonstrates morbidly obese patient who is tearful when discussing her anxiety and chest pain, but otherwise demonstrates no evidence of acute pathology.  No neurologic or vascular deficits.  No signs of trauma.  No meningismus.  Work-up is benign without evidence of coronary ischemia, ACS, CAP.  No indications for CT head imaging.   Provide migraine cocktail with resolution of symptoms and patient subsequently requesting discharge.  We discussed return precautions for the ED.  Clinical Course as of 06/01/20 1257  Thu Jun 01, 2020  1214 We discussed providing migraine cocktail and medications for her headache.  We discussed reassuring cardiopulmonary work-up so far.  Patient is in agreement with plan of care [DS]  1248 Patient ports feeling better and is requesting discharge.  We discussed return precautions for the ED. [DS]    Clinical Course User Index [DS] Delton Prairie, MD    ____________________________________________   FINAL CLINICAL IMPRESSION(S) / ED DIAGNOSES  Final diagnoses:  Other chest pain  Bad headache     ED Discharge Orders    None       Nera Haworth Katrinka Blazing   Note:  This document was prepared using Dragon voice recognition software and may include unintentional dictation errors.   Delton Prairie, MD 06/01/20 1300

## 2020-06-01 NOTE — ED Notes (Signed)
Pt declined d/c vitals, has been repeatedly ringing call bell for d/c papers and to leave. Discharge papers provided, denies questions or concerns, sig pad not working

## 2020-06-01 NOTE — Discharge Instructions (Signed)
Please take Tylenol and ibuprofen/Advil for your pain.  It is safe to take them together, or to alternate them every few hours.  Take up to 1000mg  of Tylenol at a time, up to 4 times per day.  Do not take more than 4000 mg of Tylenol in 24 hours.  For ibuprofen, take 400-600 mg, 4-5 times per day.  Continue your typical prescription medications and keep track of your blood sugars.  Use the above medications to help with headaches.  Return to the ED with any severely worsening headaches despite these medications, fevers and headaches or passing out and headaches.

## 2020-06-01 NOTE — ED Triage Notes (Signed)
Pt to ED [facility] from home for centralized cp that started this am. Denies shob.  Hx diabetes, took prescribed insulin PTA Pt in NAD, RR even and unlabored

## 2020-09-16 ENCOUNTER — Emergency Department (HOSPITAL_BASED_OUTPATIENT_CLINIC_OR_DEPARTMENT_OTHER)
Admission: EM | Admit: 2020-09-16 | Discharge: 2020-09-16 | Disposition: A | Discharge status: Home / Self Care | Payer: Medicaid Other | Attending: Emergency Medicine | Admitting: Emergency Medicine

## 2020-09-16 ENCOUNTER — Other Ambulatory Visit: Payer: Self-pay

## 2020-09-16 ENCOUNTER — Encounter (HOSPITAL_BASED_OUTPATIENT_CLINIC_OR_DEPARTMENT_OTHER): Payer: Self-pay | Admitting: Emergency Medicine

## 2020-09-16 DIAGNOSIS — Z5321 Procedure and treatment not carried out due to patient leaving prior to being seen by health care provider: Secondary | ICD-10-CM | POA: Insufficient documentation

## 2020-09-16 DIAGNOSIS — L559 Sunburn, unspecified: Secondary | ICD-10-CM | POA: Insufficient documentation

## 2020-09-16 NOTE — ED Triage Notes (Signed)
Pt c/o pain from sunburn; sts she was in the sun for 2 hrs today with sunscreen

## 2020-09-17 NOTE — ED Notes (Signed)
Pt called x 3 no answer 

## 2020-10-26 ENCOUNTER — Emergency Department
Admission: EM | Admit: 2020-10-26 | Discharge: 2020-10-27 | Disposition: A | Discharge status: Home / Self Care | Payer: Medicaid Other | Attending: Emergency Medicine | Admitting: Emergency Medicine

## 2020-10-26 ENCOUNTER — Emergency Department: Payer: Medicaid Other

## 2020-10-26 ENCOUNTER — Encounter: Payer: Self-pay | Admitting: *Deleted

## 2020-10-26 ENCOUNTER — Other Ambulatory Visit: Payer: Self-pay

## 2020-10-26 DIAGNOSIS — Z5321 Procedure and treatment not carried out due to patient leaving prior to being seen by health care provider: Secondary | ICD-10-CM | POA: Diagnosis not present

## 2020-10-26 DIAGNOSIS — R079 Chest pain, unspecified: Secondary | ICD-10-CM | POA: Diagnosis not present

## 2020-10-26 LAB — CBC
HCT: 44.5 % (ref 36.0–46.0)
Hemoglobin: 14.7 g/dL (ref 12.0–15.0)
MCH: 27.4 pg (ref 26.0–34.0)
MCHC: 33 g/dL (ref 30.0–36.0)
MCV: 82.9 fL (ref 80.0–100.0)
Platelets: 375 10*3/uL (ref 150–400)
RBC: 5.37 MIL/uL — ABNORMAL HIGH (ref 3.87–5.11)
RDW: 13.4 % (ref 11.5–15.5)
WBC: 17.1 10*3/uL — ABNORMAL HIGH (ref 4.0–10.5)
nRBC: 0 % (ref 0.0–0.2)

## 2020-10-26 LAB — BASIC METABOLIC PANEL
Anion gap: 15 (ref 5–15)
BUN: 12 mg/dL (ref 6–20)
CO2: 21 mmol/L — ABNORMAL LOW (ref 22–32)
Calcium: 9.3 mg/dL (ref 8.9–10.3)
Chloride: 97 mmol/L — ABNORMAL LOW (ref 98–111)
Creatinine, Ser: 0.76 mg/dL (ref 0.44–1.00)
GFR, Estimated: >60 mL/min (ref >60)
Glucose, Bld: 300 mg/dL — ABNORMAL HIGH (ref 70–99)
Potassium: 4 mmol/L (ref 3.5–5.1)
Sodium: 133 mmol/L — ABNORMAL LOW (ref 135–145)

## 2020-10-26 LAB — HCG, QUANTITATIVE, PREGNANCY: hCG, Beta Chain, Quant, S: <1 m[IU]/mL (ref <5)

## 2020-10-26 LAB — TROPONIN I (HIGH SENSITIVITY): Troponin I (High Sensitivity): 3 ng/L (ref <18)

## 2020-10-26 NOTE — ED Triage Notes (Signed)
Pt reports onset of (central) chest pain radiates to the back for about 30 minutes, she says she was trying to relax after a stressful day when it started. Reports her spouse told her she passed out in the bathroom (after the CP started). Tingling in the left hand.

## 2020-10-26 NOTE — ED Notes (Signed)
Called pt for repeat labs and vitals.  No answer outside or in the lobby.  Called Pts cell number on file and received busy signal and LM on home phone.

## 2020-11-16 ENCOUNTER — Emergency Department
Admission: EM | Admit: 2020-11-16 | Discharge: 2020-11-16 | Discharge status: Against Medical Advice | Payer: Medicaid Other

## 2020-11-16 NOTE — ED Notes (Addendum)
FIRST NURSE NOTE: Upon checking in, pt advised registration that she did not want mother to know she was here, pt and visitor upon checking in notified registration that mother was coming to the ED.    Shortly after the patient had checked in waiting to go back to triage mother walked in. Pt became upset and pt, pt's mother and pt's visitor went outside with security, pt ripped off arm band and declined to be seen.    There is question whether or not the patient has a legal guardian, at this time there is no paperwork on file that pt has a legal guardian.

## 2021-01-14 ENCOUNTER — Emergency Department (HOSPITAL_COMMUNITY)
Admission: EM | Admit: 2021-01-14 | Discharge: 2021-01-14 | Disposition: A | Discharge status: Home / Self Care | Payer: Medicaid Other | Attending: Emergency Medicine | Admitting: Emergency Medicine

## 2021-01-14 ENCOUNTER — Encounter (HOSPITAL_COMMUNITY): Payer: Self-pay | Admitting: Emergency Medicine

## 2021-01-14 DIAGNOSIS — J45909 Unspecified asthma, uncomplicated: Secondary | ICD-10-CM | POA: Diagnosis not present

## 2021-01-14 DIAGNOSIS — T7840XA Allergy, unspecified, initial encounter: Secondary | ICD-10-CM | POA: Diagnosis present

## 2021-01-14 DIAGNOSIS — R569 Unspecified convulsions: Secondary | ICD-10-CM | POA: Diagnosis not present

## 2021-01-14 DIAGNOSIS — T782XXA Anaphylactic shock, unspecified, initial encounter: Secondary | ICD-10-CM | POA: Insufficient documentation

## 2021-01-14 LAB — CBC WITH DIFFERENTIAL/PLATELET
Abs Immature Granulocytes: 0.5 10*3/uL — ABNORMAL HIGH (ref 0.00–0.07)
Basophils Absolute: 0 10*3/uL (ref 0.0–0.1)
Basophils Relative: 0 %
Eosinophils Absolute: 0.5 10*3/uL (ref 0.0–0.5)
Eosinophils Relative: 2 %
HCT: 41.9 % (ref 36.0–46.0)
Hemoglobin: 14.1 g/dL (ref 12.0–15.0)
Lymphocytes Relative: 39 %
Lymphs Abs: 8.8 10*3/uL — ABNORMAL HIGH (ref 0.7–4.0)
MCH: 28 pg (ref 26.0–34.0)
MCHC: 33.7 g/dL (ref 30.0–36.0)
MCV: 83.3 fL (ref 80.0–100.0)
Monocytes Absolute: 0.7 10*3/uL (ref 0.1–1.0)
Monocytes Relative: 3 %
Myelocytes: 2 %
Neutro Abs: 12.2 10*3/uL — ABNORMAL HIGH (ref 1.7–7.7)
Neutrophils Relative %: 54 %
Platelet Morphology: NORMAL
Platelets: 438 10*3/uL — ABNORMAL HIGH (ref 150–400)
RBC: 5.03 MIL/uL (ref 3.87–5.11)
RDW: 13.7 % (ref 11.5–15.5)
WBC: 22.5 10*3/uL — ABNORMAL HIGH (ref 4.0–10.5)
nRBC: 0 % (ref 0.0–0.2)

## 2021-01-14 LAB — BASIC METABOLIC PANEL
Anion gap: 11 (ref 5–15)
BUN: 12 mg/dL (ref 6–20)
CO2: 22 mmol/L (ref 22–32)
Calcium: 8.6 mg/dL — ABNORMAL LOW (ref 8.9–10.3)
Chloride: 99 mmol/L (ref 98–111)
Creatinine, Ser: 0.78 mg/dL (ref 0.44–1.00)
GFR, Estimated: >60 mL/min (ref >60)
Glucose, Bld: 385 mg/dL — ABNORMAL HIGH (ref 70–99)
Potassium: 3.2 mmol/L — ABNORMAL LOW (ref 3.5–5.1)
Sodium: 132 mmol/L — ABNORMAL LOW (ref 135–145)

## 2021-01-14 LAB — CBG MONITORING, ED: Glucose-Capillary: 396 mg/dL — ABNORMAL HIGH (ref 70–99)

## 2021-01-14 LAB — HCG, SERUM, QUALITATIVE: Preg, Serum: NEGATIVE

## 2021-01-14 MED ORDER — SODIUM CHLORIDE 0.9 % IV BOLUS
1000.0000 mL | Freq: Once | INTRAVENOUS | Status: AC
  Administered 2021-01-14: 14:00:00 1000 mL via INTRAVENOUS

## 2021-01-14 MED ORDER — EPINEPHRINE 0.3 MG/0.3ML IJ SOAJ: 0.3000 mg | INTRAMUSCULAR | 0 refills | Status: AC | PRN

## 2021-01-14 MED ORDER — INSULIN ASPART 100 UNIT/ML IJ SOLN
5.0000 [IU] | Freq: Once | INTRAMUSCULAR | Status: DC
  Filled 2021-01-14: qty 0.05

## 2021-01-14 MED ORDER — EPINEPHRINE 0.3 MG/0.3ML IJ SOAJ
INTRAMUSCULAR | Status: AC
  Filled 2021-01-14: qty 0.3

## 2021-01-14 MED ORDER — FAMOTIDINE IN NACL 20-0.9 MG/50ML-% IV SOLN
20.0000 mg | Freq: Once | INTRAVENOUS | Status: AC
  Administered 2021-01-14: 14:00:00 20 mg via INTRAVENOUS
  Filled 2021-01-14: qty 50

## 2021-01-14 MED ORDER — METHYLPREDNISOLONE SODIUM SUCC 125 MG IJ SOLR
125.0000 mg | Freq: Once | INTRAMUSCULAR | Status: AC
  Administered 2021-01-14: 14:00:00 125 mg via INTRAVENOUS
  Filled 2021-01-14: qty 2

## 2021-01-14 MED ORDER — CETIRIZINE HCL 10 MG PO TABS: 10.0000 mg | ORAL_TABLET | Freq: Every day | ORAL | 0 refills | Status: DC

## 2021-01-14 NOTE — ED Triage Notes (Signed)
PT reports scratchy throat and itching after eating chicken. 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 Took 50mg  benadryl PO PTA.

## 2021-01-14 NOTE — ED Notes (Signed)
MD at bedside to speak with pt.

## 2021-01-14 NOTE — ED Notes (Signed)
Pt NAD in bed with someone at bedside. A/ox4, speaking in full and complete sentences. Pt states she had an allergic reaction to cajun seasoning on chicken. Pt states she had throat tightness, itching and red blotchy skin. Pt states she feels much better after TX. LS clear, VSS. Skin PWD.

## 2021-01-14 NOTE — ED Notes (Signed)
Pt NAD, a/ox4. Pt verbalizes understanding of all DC and f/u instructions. All questions answered. Pt walks with steady gait to lobby at DC.  ? ?

## 2021-01-14 NOTE — ED Provider Notes (Signed)
[facility]-EMERGENCY DEPT Provider Note   CSN: [id number] Arrival date & time: 01/14/21  1339     History Chief Complaint  Patient presents with   Allergic Reaction    Sabrina Blevins is a 25 y.o. female.  This is a 25 y.o. female with significant medical history as below, including multiple allergies, ADHD who presents to the ED with complaint of allergic reaction.  Patient ports that she is allergic to cinnamon and paprika.  She ate some chicken for lunch and was told it did not contain any paprika however shortly after eating the chicken she began to experience dysphagia, diaphoresis, burning sensation to her chest and face.  No nausea or vomiting.  She took a Benadryl prior to coming to the hospital did not significant improve her symptoms.  She does not have EpiPen.   Level 5 caveat, acuity of condition   The history is provided by the patient. No language interpreter was used.  Allergic Reaction Presenting symptoms: difficulty swallowing and rash       Past Medical History:  Diagnosis Date   ADHD    Airborne allergy, current reaction    Asthma    Bipolar 1 disorder (HCC)    Bipolar disorder (HCC)    Cholecystitis    Morbid obesity (HCC)    Seizures (HCC)     Patient Active Problem List   Diagnosis Date Noted   PTSD (post-traumatic stress disorder) 03/13/2018   UTI (urinary tract infection) 03/13/2018   Noncompliance with treatment 03/13/2018   Major depressive disorder, recurrent episode with mixed features (HCC) 06/03/2017   Suicidal ideation 06/02/2017    Past Surgical History:  Procedure Laterality Date   ADENOIDECTOMY     TONSILLECTOMY       OB History     Gravida  1   Para  0   Term  0   Preterm  0   AB  1   Living         SAB  1   IAB  0   Ectopic  0   Multiple      Live Births              No family history on file.  Social History   Tobacco Use   Smoking status: Never   Smokeless  tobacco: Never  Vaping Use   Vaping Use: Never used  Substance Use Topics   Alcohol use: Never   Drug use: Never    Home Medications Prior to Admission medications   Medication Sig Start Date End Date Taking? Authorizing Provider  cetirizine (ZYRTEC ALLERGY) 10 MG tablet Take 1 tablet (10 mg total) by mouth daily for 14 days. 01/14/21 01/28/21 Yes Tanda Rockers A, DO  diphenhydrAMINE HCl (BENADRYL PO) Take 2 capsules by mouth once.   Yes [provider]  EPINEPHrine 0.3 mg/0.3 mL IJ SOAJ injection Inject 0.3 mg into the muscle as needed for anaphylaxis. 01/14/21  Yes Tanda Rockers A, DO  Multiple Vitamins-Minerals (ADULT GUMMY PO) Take by mouth.   Yes [provider]  Bempedoic Acid-Ezetimibe (NEXLIZET) 180-10 MG TABS Take 1 tablet by mouth daily. Patient not taking: Reported on 01/14/2021 03/31/20   [provider]  lipase/protease/amylase (CREON) 36000 UNITS CPEP capsule Take 36,000-72,000 Units by mouth See admin instructions. Take 2 capsules (72000 units) by mouth three times daily with meals, take 1 capsule (36000 units) with snacks Patient not taking: Reported on 01/14/2021    [provider]  liraglutide (VICTOZA) 18 MG/3ML SOPN Inject 1.8 mg into the skin daily. Patient not taking: Reported on 01/14/2021    [provider]  lisinopril (ZESTRIL) 10 MG tablet Take 10 mg by mouth daily. Patient not taking: Reported on 01/14/2021 11/24/20   [provider]  Lurasidone HCl (LATUDA) 60 MG TABS Take 60 mg by mouth daily. Patient not taking: Reported on 01/14/2021 03/30/20   [provider]    Allergies    Bee venom, Cinnamon, Contrast media [iodinated diagnostic agents], Depakote [valproic acid], Motrin [ibuprofen], Nutmeg oil (myristica oil), Penicillins, Benadryl [diphenhydramine hcl], Oxcarbazepine, and Topamax [topiramate]  Review of Systems   Review of Systems  Unable to perform ROS: Acuity of condition  HENT:  Positive  for trouble swallowing.   Skin:  Positive for rash.   Physical Exam Updated Vital Signs BP (!) 138/44 (BP Location: Left Arm)   Pulse 98   Temp 98.1 F (36.7 C) (Oral)   Resp 18   SpO2 96%   Physical Exam Vitals and nursing note reviewed.  Constitutional:      General: She is not in acute distress.    Appearance: Normal appearance. She is obese. She is ill-appearing and diaphoretic.  HENT:     Head: Normocephalic and atraumatic.     Comments: No angioedema    Right Ear: External ear normal.     Left Ear: External ear normal.     Nose: Nose normal.     Mouth/Throat:     Mouth: Mucous membranes are moist.  Eyes:     General: No scleral icterus.       Right eye: No discharge.        Left eye: No discharge.  Cardiovascular:     Rate and Rhythm: Normal rate and regular rhythm.     Pulses: Normal pulses.     Heart sounds: Normal heart sounds.  Pulmonary:     Effort: Pulmonary effort is normal. No respiratory distress.     Breath sounds: Normal breath sounds.  Chest:     Comments: Urticaria to chest Abdominal:     General: Abdomen is flat.     Tenderness: There is no abdominal tenderness.  Musculoskeletal:        General: Normal range of motion.     Cervical back: Normal range of motion.     Right lower leg: No edema.     Left lower leg: No edema.  Skin:    General: Skin is warm.     Capillary Refill: Capillary refill takes less than 2 seconds.  Neurological:     Mental Status: She is alert.  Psychiatric:        Mood and Affect: Mood normal.        Behavior: Behavior normal.    ED Results / Procedures / Treatments   Labs (all labs ordered are listed, but only abnormal results are displayed) Labs Reviewed  CBC WITH DIFFERENTIAL/PLATELET - Abnormal; Notable for the following components:      Result Value   WBC 22.5 (*)    Platelets 438 (*)    Neutro Abs 12.2 (*)    Lymphs Abs 8.8 (*)    Abs Immature Granulocytes 0.50 (*)    All other components within normal  limits  BASIC METABOLIC PANEL - Abnormal; Notable for the following components:   Sodium 132 (*)    Potassium 3.2 (*)    Glucose, Bld 385 (*)    Calcium 8.6 (*)    All  other components within normal limits  HCG, SERUM, QUALITATIVE  PATHOLOGIST SMEAR REVIEW    EKG None  Radiology No results found.  Procedures .Critical Care Performed by: Sloan Leiter, DO Authorized by: Sloan Leiter, DO   Critical care provider statement:    Critical care time (minutes):  30   Critical care time was exclusive of:  Separately billable procedures and treating other patients   Critical care was necessary to treat or prevent imminent or life-threatening deterioration of the following conditions: anapyhlaxis.   Critical care was time spent personally by me on the following activities:  Development of treatment plan with patient or surrogate, discussions with consultants, evaluation of patient's response to treatment, examination of patient, ordering and review of laboratory studies, ordering and review of radiographic studies, ordering and performing treatments and interventions, pulse oximetry, re-evaluation of patient's condition and review of old charts   Medications Ordered in ED Medications  EPINEPHrine (EPI-PEN) 0.3 mg/0.3 mL injection ( Intramuscular Given 01/14/21 1345)  sodium chloride 0.9 % bolus 1,000 mL (1,000 mLs Intravenous New Bag/Given (Non-Interop) 01/14/21 1402)  methylPREDNISolone sodium succinate (SOLU-MEDROL) 125 mg/2 mL injection 125 mg (125 mg Intravenous Given 01/14/21 1401)  famotidine (PEPCID) IVPB 20 mg premix (20 mg Intravenous New Bag/Given 01/14/21 1409)    ED Course  I have reviewed the triage vital signs and the nursing notes.  Pertinent labs & imaging results that were available during my care of the patient were reviewed by me and considered in my medical decision making (see chart for details).    MDM Rules/Calculators/A&P                           CC:  Allergic reaction  This patient complains of above; this involves an extensive number of treatment options and is a complaint that carries with it a high risk of complications and morbidity. Vital signs were reviewed. Serious etiologies considered.  Record review:  Previous records obtained and reviewed   Work up as above, notable for:  Labs results that were available during my care of the patient were reviewed by me and considered in my medical decision making.    Management: Concern for anaphylaxis, given EpiPen, steroids, Pepcid, IV fluids.  She took Benadryl prior to arrival  Reassessment:  Patient ports symptoms greatly improved following intervention.  Respiratory status improved, rash resolving.  Sensation of dysphagia is improved.  Patient overall is feeling much better.   patient with difficulty affording her medications, TOC consult placed  Plan to observe for approximately 3 hours total and discharge if stable.  Patient signed out to incoming physician pending final disposition.         This chart was dictated using voice recognition software.  Despite best efforts to proofread,  errors can occur which can change the documentation meaning.  Final Clinical Impression(s) / ED Diagnoses Final diagnoses:  Anaphylaxis, initial encounter    Rx / DC Orders ED Discharge Orders          Ordered    EPINEPHrine 0.3 mg/0.3 mL IJ SOAJ injection  As needed        01/14/21 1530    cetirizine (ZYRTEC ALLERGY) 10 MG tablet  Daily        01/14/21 1530             Sloan Leiter, DO 01/14/21 1531

## 2021-01-14 NOTE — ED Provider Notes (Signed)
Patient is a 25 year old female who presents with an allergic reaction.  Care was taken over from Dr. Wallace Cullens pending observation.  She has been monitored for about 3 and half hours following the epinephrine injection and she has not had any return of symptoms.  She does not want to stay for any further monitoring.  She says that she feels fine and is ready to go home.  Her blood glucose is elevated at the high 300s.  She does not have any symptoms related to this.  She is on Victoza but she has she has not been able to get it recently because the pharmacy was out of it.  She says that she is going to pick it up today.  I ordered subcu insulin but she is refusing this.  She is confident that she is going to be able to start the Victoza today.  She does not want any further treatment for this.  Her WBC count is elevated which it has been in the past.  She denies any fevers or other infectious symptoms.  And again, she does not want to stay for any further evaluation.  She will follow-up with her doctor.  Return precautions were given.  She was given prescriptions of an EpiPen and cetirizine by Dr. Wallace Cullens.   Rolan Bucco, MD 01/14/21 1729

## 2021-01-14 NOTE — ED Notes (Signed)
Pt refusing insulin, states she can manage it at home and would like to leave. MD Fredderick Phenix made aware

## 2021-01-18 LAB — PATHOLOGIST SMEAR REVIEW

## 2021-02-14 ENCOUNTER — Other Ambulatory Visit: Payer: Self-pay

## 2021-02-14 ENCOUNTER — Emergency Department
Admission: EM | Admit: 2021-02-14 | Discharge: 2021-02-14 | Disposition: A | Discharge status: Home / Self Care | Payer: Medicaid Other | Attending: Emergency Medicine | Admitting: Emergency Medicine

## 2021-02-14 DIAGNOSIS — T7840XA Allergy, unspecified, initial encounter: Secondary | ICD-10-CM

## 2021-02-14 DIAGNOSIS — J45909 Unspecified asthma, uncomplicated: Secondary | ICD-10-CM | POA: Diagnosis not present

## 2021-02-14 DIAGNOSIS — Z79899 Other long term (current) drug therapy: Secondary | ICD-10-CM | POA: Diagnosis not present

## 2021-02-14 DIAGNOSIS — L500 Allergic urticaria: Secondary | ICD-10-CM | POA: Diagnosis present

## 2021-02-14 MED ORDER — EPINEPHRINE 0.3 MG/0.3ML IJ SOAJ: 0.3000 mg | INTRAMUSCULAR | 1 refills | Status: DC | PRN

## 2021-02-14 MED ORDER — METHYLPREDNISOLONE SODIUM SUCC 125 MG IJ SOLR
125.0000 mg | Freq: Once | INTRAMUSCULAR | Status: AC
  Administered 2021-02-14: 18:00:00 125 mg via INTRAMUSCULAR
  Filled 2021-02-14: qty 2

## 2021-02-14 MED ORDER — SODIUM CHLORIDE 0.9 % IV BOLUS
1000.0000 mL | Freq: Once | INTRAVENOUS | Status: AC
  Administered 2021-02-14: 18:00:00 1000 mL via INTRAVENOUS

## 2021-02-14 MED ORDER — FAMOTIDINE IN NACL 20-0.9 MG/50ML-% IV SOLN
20.0000 mg | Freq: Once | INTRAVENOUS | Status: AC
  Administered 2021-02-14: 18:00:00 20 mg via INTRAVENOUS
  Filled 2021-02-14: qty 50

## 2021-02-14 NOTE — ED Provider Notes (Signed)
Emergency Medicine Provider Triage Evaluation Note  Sabrina Blevins , a 25 y.o. female  was evaluated in triage.  Pt complains of throat burning after red velvet cake with cream cheese icing that must have contained cinnamon. She took Benadryl. No difficulty swallowing or breathing. No hives.  Review of Systems  Positive: Throat itching and burning. Negative: Shortness of breath.  Physical Exam  There were no vitals taken for this visit. Gen:   Awake, no distress   Resp:  Normal effort  MSK:   Moves extremities without difficulty  Other:    Medical Decision Making  Medically screening exam initiated at 6:11 PM.  Appropriate orders placed.  Sabrina Blevins was informed that the remainder of the evaluation will be completed by another provider, this initial triage assessment does not replace that evaluation, and the importance of remaining in the ED until their evaluation is complete.    Sabrina Pester, FNP 02/14/21 1816    Sabrina Katos, MD 02/14/21 2116

## 2021-02-14 NOTE — ED Provider Notes (Signed)
[facility] Emergency Department Provider Note   ____________________________________________   Event Date/Time   First MD Initiated Contact with Patient 02/14/21 2028     (approximate)  I have reviewed the triage vital signs and the nursing notes.   HISTORY  Chief Complaint Allergic Reaction    HPI Sabrina Blevins is a 25 y.o. 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  Patient symptoms relieved with Benadryl, Solu-Medrol, and Pepcid ASSOCIATED SYMPTOMS: Denies any difficulty breathing, difficulty swallowing, chest pain, or nausea/vomiting   Per medical record review, patient has history of of allergy to cinnamon          Past Medical History:  Diagnosis Date   ADHD    Airborne allergy, current reaction    Asthma    Bipolar 1 disorder (HCC)    Bipolar disorder (HCC)    Cholecystitis    Morbid obesity (HCC)    Seizures (HCC)     Patient Active Problem List   Diagnosis Date Noted   PTSD (post-traumatic stress disorder) 03/13/2018   UTI (urinary tract infection) 03/13/2018   Noncompliance with treatment 03/13/2018   Major depressive disorder, recurrent episode with mixed features (HCC) 06/03/2017   Suicidal ideation 06/02/2017    Past Surgical History:  Procedure Laterality Date   ADENOIDECTOMY     TONSILLECTOMY      Prior to Admission medications   Medication Sig Start Date End Date Taking? Authorizing Provider  EPINEPHrine 0.3 mg/0.3 mL IJ SOAJ injection Inject 0.3 mg into the muscle as needed for anaphylaxis. 02/14/21  Yes  Merwyn Katos, MD  Bempedoic Acid-Ezetimibe (NEXLIZET) 180-10 MG TABS Take 1 tablet by mouth daily. Patient not taking: Reported on 01/14/2021 03/31/20   [provider]  cetirizine (ZYRTEC ALLERGY) 10 MG tablet Take 1 tablet (10 mg total) by mouth daily for 14 days. 01/14/21 01/28/21  Tanda Rockers A, DO  diphenhydrAMINE HCl (BENADRYL PO) Take 2 capsules by mouth once.    [provider]  EPINEPHrine 0.3 mg/0.3 mL IJ SOAJ injection Inject 0.3 mg into the muscle as needed for anaphylaxis. 01/14/21   Sloan Leiter, DO  lipase/protease/amylase (CREON) 36000 UNITS CPEP capsule Take 36,000-72,000 Units by mouth See admin instructions. Take 2 capsules (72000 units) by mouth three times daily with meals, take 1 capsule (36000 units) with snacks Patient not taking: Reported on 01/14/2021    [provider]  liraglutide (VICTOZA) 18 MG/3ML SOPN Inject 1.8 mg into the skin daily. Patient not taking: Reported on 01/14/2021    [provider]  lisinopril (ZESTRIL) 10 MG tablet Take 10 mg by mouth daily. Patient not taking: Reported on 01/14/2021 11/24/20   [provider]  Lurasidone HCl (LATUDA) 60 MG TABS Take 60 mg by mouth daily. Patient not taking: Reported on 01/14/2021 03/30/20   [provider]  Multiple Vitamins-Minerals (ADULT GUMMY PO) Take by mouth.    [provider]    Allergies Bee venom, Cinnamon, Contrast media [iodinated contrast media], Depakote [valproic acid], Motrin [ibuprofen], Nutmeg oil (myristica oil), Penicillins, Benadryl [diphenhydramine hcl], Oxcarbazepine, and Topamax [topiramate]  No family history  on file.  Social History Social History   Tobacco Use   Smoking status: Never   Smokeless tobacco: Never  Vaping Use   Vaping Use: Never used  Substance Use Topics   Alcohol use: Never   Drug use: Never    Review of Systems Constitutional: No fever/chills Eyes: No visual changes. ENT: No sore  throat. Cardiovascular: Denies chest pain. Respiratory: Denies shortness of breath. Gastrointestinal: No abdominal pain.  No nausea, no vomiting.  No diarrhea. Genitourinary: Negative for dysuria. Musculoskeletal: Negative for acute arthralgias Skin: Positive for hives. Neurological: Negative for headaches, weakness/numbness/paresthesias in any extremity Psychiatric: Negative for suicidal ideation/homicidal ideation   ____________________________________________   PHYSICAL EXAM:  VITAL SIGNS: ED Triage Vitals [02/14/21 1813]  Enc Vitals Group     BP 99/88     Pulse Rate (!) 119     Resp 18     Temp 98.4 F (36.9 C)     Temp Source Oral     SpO2 100 %     Weight 300 lb (136.1 kg)     Height 5\' 8"  (1.727 m)     Head Circumference      Peak Flow      Pain Score 0     Pain Loc      Pain Edu?      Excl. in GC?    Constitutional: Alert and oriented. Well appearing and in no acute distress. Eyes: Conjunctivae are normal. PERRL. Head: Atraumatic. Nose: No congestion/rhinnorhea. Mouth/Throat: Mucous membranes are moist. Neck: No stridor Cardiovascular: Grossly normal heart sounds.  Good peripheral circulation. Respiratory: Normal respiratory effort.  No retractions. Gastrointestinal: Soft and nontender. No distention. Musculoskeletal: No obvious deformities Neurologic:  Normal speech and language. No gross focal neurologic deficits are appreciated. Skin:  Skin is warm and dry. No rash noted. Psychiatric: Mood and affect are normal. Speech and behavior are normal.  ____________________________________________   LABS (all labs ordered are listed, but only abnormal results are displayed)  Labs Reviewed - No data to display ____________________________________________  PROCEDURES  Procedure(s) performed (including Critical Care):  Procedures   ____________________________________________   INITIAL IMPRESSION / ASSESSMENT AND PLAN / ED COURSE  As part of my  medical decision making, I reviewed the following data within the electronic medical record, if available:  Nursing notes reviewed and incorporated, Labs reviewed, EKG interpreted, Old chart reviewed, Radiograph reviewed and Notes from prior ED visits reviewed and incorporated        + Cutaneous hives/erythema No evidence of multiorgan involvement  Given history and exam, presentation most consistent with allergic reaction. I have low suspicion for toxic shock syndrome, anaphylaxis, asthma exacerbation, or drug toxicity. Rx: EpiPen Disposition: Discharge home with SRP. Follow up with PCP in 1-2 days.      ____________________________________________   FINAL CLINICAL IMPRESSION(S) / ED DIAGNOSES  Final diagnoses:  Allergic reaction, initial encounter     ED Discharge Orders          Ordered    EPINEPHrine 0.3 mg/0.3 mL IJ SOAJ injection  As needed        02/14/21 2053             Note:  This document was prepared using Dragon voice recognition software and may include unintentional dictation errors.    2054, MD 02/14/21 2115

## 2021-02-14 NOTE — ED Triage Notes (Signed)
Pt here with an allergic reaction to cinnamon. Pt states that she ate a piece of red velvet cake and instantly tasted the substance. Pt states she took one benadryl and feels itchy all over.

## 2021-03-07 ENCOUNTER — Encounter: Payer: Self-pay | Admitting: Emergency Medicine

## 2021-03-07 ENCOUNTER — Other Ambulatory Visit: Payer: Self-pay

## 2021-03-07 ENCOUNTER — Emergency Department: Payer: Medicaid Other

## 2021-03-07 DIAGNOSIS — Z0471 Encounter for examination and observation following alleged adult physical abuse: Secondary | ICD-10-CM | POA: Diagnosis not present
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
## 2021-03-08 ENCOUNTER — Emergency Department
Admission: EM | Admit: 2021-03-08 | Discharge: 2021-03-08 | Disposition: A | Discharge status: Home / Self Care | Payer: Medicaid Other | Attending: Emergency Medicine | Admitting: Emergency Medicine

## 2021-03-09 ENCOUNTER — Emergency Department
Admission: EM | Admit: 2021-03-09 | Discharge: 2021-03-10 | Disposition: A | Discharge status: Home / Self Care | Payer: Medicaid Other | Attending: Emergency Medicine | Admitting: Emergency Medicine

## 2021-03-09 ENCOUNTER — Other Ambulatory Visit: Payer: Self-pay

## 2021-03-09 DIAGNOSIS — R1111 Vomiting without nausea: Secondary | ICD-10-CM | POA: Insufficient documentation

## 2021-03-09 DIAGNOSIS — R1011 Right upper quadrant pain: Secondary | ICD-10-CM | POA: Diagnosis present

## 2021-03-09 DIAGNOSIS — Z5321 Procedure and treatment not carried out due to patient leaving prior to being seen by health care provider: Secondary | ICD-10-CM | POA: Diagnosis not present

## 2021-03-10 ENCOUNTER — Emergency Department: Payer: Medicaid Other

## 2021-03-10 ENCOUNTER — Other Ambulatory Visit: Payer: Self-pay

## 2021-03-10 LAB — COMPREHENSIVE METABOLIC PANEL
ALT: 34 U/L (ref 0–44)
AST: 63 U/L — ABNORMAL HIGH (ref 15–41)
Albumin: 3.5 g/dL (ref 3.5–5.0)
Alkaline Phosphatase: 76 U/L (ref 38–126)
Anion gap: 10 (ref 5–15)
BUN: 11 mg/dL (ref 6–20)
CO2: 24 mmol/L (ref 22–32)
Calcium: 8.9 mg/dL (ref 8.9–10.3)
Chloride: 103 mmol/L (ref 98–111)
Creatinine, Ser: 0.66 mg/dL (ref 0.44–1.00)
GFR, Estimated: >60 mL/min (ref >60)
Glucose, Bld: 278 mg/dL — ABNORMAL HIGH (ref 70–99)
Potassium: 3.6 mmol/L (ref 3.5–5.1)
Sodium: 137 mmol/L (ref 135–145)
Total Bilirubin: 0.4 mg/dL (ref 0.3–1.2)
Total Protein: 7.1 g/dL (ref 6.5–8.1)

## 2021-03-10 LAB — CBC
HCT: 40 % (ref 36.0–46.0)
Hemoglobin: 13.2 g/dL (ref 12.0–15.0)
MCH: 27.6 pg (ref 26.0–34.0)
MCHC: 33 g/dL (ref 30.0–36.0)
MCV: 83.7 fL (ref 80.0–100.0)
Platelets: 372 10*3/uL (ref 150–400)
RBC: 4.78 MIL/uL (ref 3.87–5.11)
RDW: 13.6 % (ref 11.5–15.5)
WBC: 16.6 10*3/uL — ABNORMAL HIGH (ref 4.0–10.5)
nRBC: 0 % (ref 0.0–0.2)

## 2021-03-10 LAB — LIPASE, BLOOD: Lipase: 37 U/L (ref 11–51)

## 2021-03-10 MED ORDER — ONDANSETRON 4 MG PO TBDP
4.0000 mg | ORAL_TABLET | Freq: Once | ORAL | Status: AC | PRN
  Administered 2021-03-10: 4 mg via ORAL
  Filled 2021-03-10: qty 1

## 2021-03-10 NOTE — ED Notes (Signed)
Patient transported to Ultrasound 

## 2021-03-10 NOTE — ED Notes (Signed)
No answer in lobby x3 for repeat vitals

## 2021-03-10 NOTE — ED Triage Notes (Signed)
Pt states ruq pain with vomiting that began tonight. Pt states she has known gallbladder issues but has not had gallbladder removed. Pt denies diarrhea and fever.

## 2021-05-06 ENCOUNTER — Other Ambulatory Visit: Payer: Self-pay

## 2021-05-06 ENCOUNTER — Emergency Department
Admission: EM | Admit: 2021-05-06 | Discharge: 2021-05-06 | Disposition: A | Discharge status: Home / Self Care | Payer: Medicaid Other | Attending: Student in an Organized Health Care Education/Training Program | Admitting: Student in an Organized Health Care Education/Training Program

## 2021-05-06 DIAGNOSIS — R519 Headache, unspecified: Secondary | ICD-10-CM | POA: Insufficient documentation

## 2021-05-06 DIAGNOSIS — Z5321 Procedure and treatment not carried out due to patient leaving prior to being seen by health care provider: Secondary | ICD-10-CM | POA: Insufficient documentation

## 2021-05-06 DIAGNOSIS — R040 Epistaxis: Secondary | ICD-10-CM | POA: Diagnosis not present

## 2021-05-06 LAB — BASIC METABOLIC PANEL
Anion gap: 13 (ref 5–15)
BUN: 7 mg/dL (ref 6–20)
CO2: 19 mmol/L — ABNORMAL LOW (ref 22–32)
Calcium: 8.8 mg/dL — ABNORMAL LOW (ref 8.9–10.3)
Chloride: 102 mmol/L (ref 98–111)
Creatinine, Ser: 0.77 mg/dL (ref 0.44–1.00)
GFR, Estimated: >60 mL/min (ref >60)
Glucose, Bld: 307 mg/dL — ABNORMAL HIGH (ref 70–99)
Potassium: 4 mmol/L (ref 3.5–5.1)
Sodium: 134 mmol/L — ABNORMAL LOW (ref 135–145)

## 2021-05-06 LAB — CBC
HCT: 42.6 % (ref 36.0–46.0)
Hemoglobin: 14.1 g/dL (ref 12.0–15.0)
MCH: 27.5 pg (ref 26.0–34.0)
MCHC: 33.1 g/dL (ref 30.0–36.0)
MCV: 83.2 fL (ref 80.0–100.0)
Platelets: 345 10*3/uL (ref 150–400)
RBC: 5.12 MIL/uL — ABNORMAL HIGH (ref 3.87–5.11)
RDW: 13.5 % (ref 11.5–15.5)
WBC: 14.8 10*3/uL — ABNORMAL HIGH (ref 4.0–10.5)
nRBC: 0 % (ref 0.0–0.2)

## 2021-05-06 NOTE — ED Triage Notes (Signed)
From home via [facility]. Headache today. NO home interventions to alleviate. Pt reports increased stress with nosebleed. No bleeding noted in triage. Family reports pt was shaking uncontrollably at home. Pt does have hx of seizures but per EMS pt was not post ictal upon arrival, and family reports activity was not consistent with usual seizure like activity.  ?

## 2021-05-06 NOTE — ED Notes (Signed)
Informed by sabra, ed registration that pt has left with friend.  ?

## 2021-07-19 ENCOUNTER — Emergency Department
Admission: EM | Admit: 2021-07-19 | Discharge: 2021-07-19 | Discharge status: Against Medical Advice | Payer: Medicaid Other | Attending: Emergency Medicine | Admitting: Emergency Medicine

## 2021-07-19 ENCOUNTER — Emergency Department: Payer: Medicaid Other

## 2021-07-19 ENCOUNTER — Other Ambulatory Visit: Payer: Self-pay

## 2021-07-19 DIAGNOSIS — Z5321 Procedure and treatment not carried out due to patient leaving prior to being seen by health care provider: Secondary | ICD-10-CM | POA: Diagnosis not present

## 2021-07-19 DIAGNOSIS — R1011 Right upper quadrant pain: Secondary | ICD-10-CM | POA: Insufficient documentation

## 2021-07-19 LAB — COMPREHENSIVE METABOLIC PANEL
ALT: 100 U/L — ABNORMAL HIGH (ref 0–44)
AST: 207 U/L — ABNORMAL HIGH (ref 15–41)
Albumin: 3.9 g/dL (ref 3.5–5.0)
Alkaline Phosphatase: 100 U/L (ref 38–126)
Anion gap: 13 (ref 5–15)
BUN: 9 mg/dL (ref 6–20)
CO2: 25 mmol/L (ref 22–32)
Calcium: 9.5 mg/dL (ref 8.9–10.3)
Chloride: 101 mmol/L (ref 98–111)
Creatinine, Ser: 0.57 mg/dL (ref 0.44–1.00)
GFR, Estimated: >60 mL/min (ref >60)
Glucose, Bld: 251 mg/dL — ABNORMAL HIGH (ref 70–99)
Potassium: 3.8 mmol/L (ref 3.5–5.1)
Sodium: 139 mmol/L (ref 135–145)
Total Bilirubin: 1.5 mg/dL — ABNORMAL HIGH (ref 0.3–1.2)
Total Protein: 7.7 g/dL (ref 6.5–8.1)

## 2021-07-19 LAB — CBC
HCT: 45.5 % (ref 36.0–46.0)
Hemoglobin: 15.3 g/dL — ABNORMAL HIGH (ref 12.0–15.0)
MCH: 27.6 pg (ref 26.0–34.0)
MCHC: 33.6 g/dL (ref 30.0–36.0)
MCV: 82.1 fL (ref 80.0–100.0)
Platelets: 377 10*3/uL (ref 150–400)
RBC: 5.54 MIL/uL — ABNORMAL HIGH (ref 3.87–5.11)
RDW: 13 % (ref 11.5–15.5)
WBC: 12.8 10*3/uL — ABNORMAL HIGH (ref 4.0–10.5)
nRBC: 0 % (ref 0.0–0.2)

## 2021-07-19 LAB — CBG MONITORING, ED: Glucose-Capillary: 229 mg/dL — ABNORMAL HIGH (ref 70–99)

## 2021-07-19 LAB — LIPASE, BLOOD: Lipase: 32 U/L (ref 11–51)

## 2021-07-19 NOTE — ED Triage Notes (Signed)
Pt presents via POV co RUQ pain starting this am unrelieved by PO Tylenol. Pt reports was supposed to have cholecystectomy months ago but was unable to schedule due to transportation issues. Reports pain extending into back.

## 2021-07-20 ENCOUNTER — Other Ambulatory Visit: Payer: Self-pay

## 2021-07-20 ENCOUNTER — Telehealth: Payer: Self-pay | Admitting: Emergency Medicine

## 2021-07-20 ENCOUNTER — Emergency Department: Payer: Medicaid Other

## 2021-07-20 ENCOUNTER — Emergency Department
Admission: EM | Admit: 2021-07-20 | Discharge: 2021-07-20 | Disposition: A | Discharge status: Home / Self Care | Payer: Medicaid Other | Attending: Emergency Medicine | Admitting: Emergency Medicine

## 2021-07-20 DIAGNOSIS — K8020 Calculus of gallbladder without cholecystitis without obstruction: Secondary | ICD-10-CM

## 2021-07-20 DIAGNOSIS — K8050 Calculus of bile duct without cholangitis or cholecystitis without obstruction: Secondary | ICD-10-CM

## 2021-07-20 DIAGNOSIS — R1011 Right upper quadrant pain: Secondary | ICD-10-CM | POA: Diagnosis present

## 2021-07-20 DIAGNOSIS — R110 Nausea: Secondary | ICD-10-CM

## 2021-07-20 DIAGNOSIS — K8070 Calculus of gallbladder and bile duct without cholecystitis without obstruction: Secondary | ICD-10-CM | POA: Diagnosis not present

## 2021-07-20 DIAGNOSIS — R824 Acetonuria: Secondary | ICD-10-CM | POA: Diagnosis not present

## 2021-07-20 LAB — COMPREHENSIVE METABOLIC PANEL
ALT: 270 U/L — ABNORMAL HIGH (ref 0–44)
AST: 373 U/L — ABNORMAL HIGH (ref 15–41)
Albumin: 3.8 g/dL (ref 3.5–5.0)
Alkaline Phosphatase: 122 U/L (ref 38–126)
Anion gap: 12 (ref 5–15)
BUN: 8 mg/dL (ref 6–20)
CO2: 21 mmol/L — ABNORMAL LOW (ref 22–32)
Calcium: 9.1 mg/dL (ref 8.9–10.3)
Chloride: 102 mmol/L (ref 98–111)
Creatinine, Ser: 0.57 mg/dL (ref 0.44–1.00)
GFR, Estimated: >60 mL/min (ref >60)
Glucose, Bld: 304 mg/dL — ABNORMAL HIGH (ref 70–99)
Potassium: 4.4 mmol/L (ref 3.5–5.1)
Sodium: 135 mmol/L (ref 135–145)
Total Bilirubin: 1 mg/dL (ref 0.3–1.2)
Total Protein: 7.5 g/dL (ref 6.5–8.1)

## 2021-07-20 LAB — CBC WITH DIFFERENTIAL/PLATELET
Abs Immature Granulocytes: 0.15 10*3/uL — ABNORMAL HIGH (ref 0.00–0.07)
Basophils Absolute: 0.1 10*3/uL (ref 0.0–0.1)
Basophils Relative: 1 %
Eosinophils Absolute: 0.1 10*3/uL (ref 0.0–0.5)
Eosinophils Relative: 1 %
HCT: 46.1 % — ABNORMAL HIGH (ref 36.0–46.0)
Hemoglobin: 14.8 g/dL (ref 12.0–15.0)
Immature Granulocytes: 2 %
Lymphocytes Relative: 29 %
Lymphs Abs: 2.5 10*3/uL (ref 0.7–4.0)
MCH: 27.1 pg (ref 26.0–34.0)
MCHC: 32.1 g/dL (ref 30.0–36.0)
MCV: 84.4 fL (ref 80.0–100.0)
Monocytes Absolute: 0.5 10*3/uL (ref 0.1–1.0)
Monocytes Relative: 6 %
Neutro Abs: 5.4 10*3/uL (ref 1.7–7.7)
Neutrophils Relative %: 61 %
Platelets: 289 10*3/uL (ref 150–400)
RBC: 5.46 MIL/uL — ABNORMAL HIGH (ref 3.87–5.11)
RDW: 13.3 % (ref 11.5–15.5)
WBC: 8.7 10*3/uL (ref 4.0–10.5)
nRBC: 0 % (ref 0.0–0.2)

## 2021-07-20 LAB — URINALYSIS, ROUTINE W REFLEX MICROSCOPIC
Bilirubin Urine: NEGATIVE
Glucose, UA: >=500 mg/dL — AB
Hgb urine dipstick: NEGATIVE
Ketones, ur: 20 mg/dL — AB
Leukocytes,Ua: NEGATIVE
Nitrite: NEGATIVE
Protein, ur: NEGATIVE mg/dL
Specific Gravity, Urine: 1.025 (ref 1.005–1.030)
pH: 5 (ref 5.0–8.0)

## 2021-07-20 LAB — LIPASE, BLOOD: Lipase: 29 U/L (ref 11–51)

## 2021-07-20 MED ORDER — ONDANSETRON 4 MG PO TBDP: 4.0000 mg | ORAL_TABLET | Freq: Three times a day (TID) | ORAL | 0 refills | Status: DC | PRN

## 2021-07-20 MED ORDER — LACTATED RINGERS IV BOLUS
1000.0000 mL | Freq: Once | INTRAVENOUS | Status: AC
  Administered 2021-07-20: 1000 mL via INTRAVENOUS

## 2021-07-20 NOTE — ED Notes (Signed)
Pt states she gave urine sample in triage

## 2021-07-20 NOTE — ED Provider Notes (Signed)
[facility] Provider Note    Event Date/Time   First MD Initiated Contact with Patient 07/20/21 1226     (approximate)   History   No chief complaint on file.   HPI  Sabrina Blevins is a 26 y.o. female who presents to the ED for evaluation of No chief complaint on file.   Patient with a history of morbid obesity, BMI in the 20s.  Denies any intra-abdominal surgical history.  Patient reports 6-10 months of "gallbladder problems" and she reports previously being diagnosed with gallstones.  Never followed up with a surgeon.  She presented to the ED last night due to severe RUQ abdominal pain.  Had blood work drawn, but had to leave due to her ride leaving and otherwise not having a way home if she were to stay. She got a call this morning from one of our nurses that her LFTs were deranged and recommended come into the ED.  She reports the pain is better now than it was last night, but still present and somewhat mild.  No fevers, emesis, diarrhea, dysuria, trauma, difficulty breathing or chest pain.  Reports some nausea and postprandial emesis of solids, but tolerating liquids okay.   Physical Exam   Triage Vital Signs: ED Triage Vitals  Enc Vitals Group     BP 07/20/21 1202 130/83     Pulse Rate 07/20/21 1202 85     Resp 07/20/21 1202 18     Temp 07/20/21 1202 98.6 F (37 C)     Temp Source 07/20/21 1202 Oral     SpO2 07/20/21 1202 96 %     Weight 07/20/21 1203 (!) 340 lb (154.2 kg)     Height 07/20/21 1203 _0  (1.727 m)     Head Circumference --      Peak Flow --      Pain Score 07/20/21 1203 5     Pain Loc --      Pain Edu? --      Excl. in [place]? --     Most recent vital signs: Vitals:   07/20/21 1315 07/20/21 1330  BP: 124/78 120/78  Pulse: 84 82  Resp: 18 17  Temp:    SpO2: 96% 97%    General: Awake, no distress.  Morbidly obese.  Looks well, pleasant and conversational. CV:  Good peripheral perfusion.  Resp:  Normal  effort.  Abd:  No distention.  Mild RUQ tenderness without guarding or peritoneal features. MSK:  No deformity noted.  Neuro:  No focal deficits appreciated. Other:     ED Results / Procedures / Treatments   Labs (all labs ordered are listed, but only abnormal results are displayed) Labs Reviewed  CBC WITH DIFFERENTIAL/PLATELET - Abnormal; Notable for the following components:      Result Value   RBC 5.46 (*)    HCT 46.1 (*)    Abs Immature Granulocytes 0.15 (*)    All other components within normal limits  COMPREHENSIVE METABOLIC PANEL - Abnormal; Notable for the following components:   CO2 21 (*)    Glucose, Bld 304 (*)    AST 373 (*)    ALT 270 (*)    All other components within normal limits  URINALYSIS, ROUTINE W REFLEX MICROSCOPIC - Abnormal; Notable for the following components:   Color, Urine YELLOW (*)    APPearance HAZY (*)    Glucose, UA >=500 (*)    Ketones, ur 20 (*)    Bacteria,  UA RARE (*)    All other components within normal limits  LIPASE, BLOOD    EKG   RADIOLOGY RUQ ultrasound reviewed and interpreted by me with cholelithiasis without signs of cholecystitis or biliary obstruction  Official radiology report(s): US ABDOMEN LIMITED RUQ (LIVER/GB)  Result Date: 07/20/2021 CLINICAL DATA:  Right upper quadrant pain for 24 hours with elevated labs. EXAM: ULTRASOUND ABDOMEN LIMITED RIGHT UPPER QUADRANT COMPARISON:  03/10/2021. FINDINGS: Gallbladder: Tiny gallstones measure up to 5 mm. No wall thickening or sonographic Murphy sign. Common bile duct: Diameter: 2 mm, within normal limits. No intrahepatic biliary ductal dilatation. Liver: Diffusely increased in echogenicity. Portal vein is patent on color Doppler imaging with normal direction of blood flow towards the liver. Other: None. IMPRESSION: 1. No acute findings. 2. Cholelithiasis. 3. Hepatic steatosis. Electronically Signed   By: Lorin Picket M.D.   On: 07/20/2021 13:42    PROCEDURES and  INTERVENTIONS:  Procedures  Medications  lactated ringers bolus 1,000 mL (1,000 mLs Intravenous New Bag/Given 07/20/21 1318)     IMPRESSION / MDM / ASSESSMENT AND PLAN / ED COURSE  I reviewed the triage vital signs and the nursing notes.  Differential diagnosis includes, but is not limited to, biliary colic, cholelithiasis, cholecystitis, choledocholithiasis, pancreatitis  {Patient presents with symptoms of an acute illness or injury that is potentially life-threatening.  Morbidly obese 26 year old woman presents to the ED with signs of biliary colic suitable for outpatient management with surgical follow-up to discuss elective cholecystectomy.  Has some mild tenderness, but no peritoneal features.  Blood work without leukocytosis.  LFTs are elevated, but alk phos and bilirubin are normal.  Lipase is normal.  Urine with ketones suggestive of dehydration, but no infectious features.  She received IV fluids for this.  RUQ ultrasound with signs of stones without biliary obstruction or cholecystitis.  We will discharge with surgical referral and symptomatic measures at home as well as return precautions for the ED.  Clinical Course as of 07/20/21 1358  Fri Jul 20, 2021  1358 Reassessed.  Reports being a little bit nauseous, but okay.  We discussed cholelithiasis without complicating features discussed following up with surgery and discussed return precautions [DS]    Clinical Course User Index [DS] Vladimir Crofts, MD     FINAL CLINICAL IMPRESSION(S) / ED DIAGNOSES   Final diagnoses:  Calculus of gallbladder without cholecystitis without obstruction  Biliary colic  Nausea in adult     Rx / DC Orders   ED Discharge Orders          Ordered    ondansetron (ZOFRAN-ODT) 4 MG disintegrating tablet  Every 8 hours PRN        07/20/21 1358             Note:  This document was prepared using Dragon voice recognition software and may include unintentional dictation errors.   Vladimir Crofts, MD 07/20/21 1359

## 2021-07-20 NOTE — Telephone Encounter (Signed)
Called patient due to left emergency department before provider exam to inquire about condition and follow up plans. She says she had to leave because her ride was leaving.  I told her that I would recommend that she be seen by a provider and that her labs were abnormal.  Says she will get seen, either here or elsewhere.

## 2021-07-20 NOTE — ED Triage Notes (Signed)
Pt presents to ER via POV from home. Pt states she was seen yesterday for gallbladder attack and RUQ pain. Pt had to leave due to transportation issues yesterday prior to getting US done.   Pt received a call this morning by ER MD to come in due to elevated liver enzymes.

## 2021-07-20 NOTE — Discharge Instructions (Signed)
Use Tylenol for pain and fevers.  Up to 1000 mg per dose, up to 4 times per day.  Do not take more than 4000 mg of Tylenol/acetaminophen within 24 hours..  

## 2021-08-02 ENCOUNTER — Encounter: Payer: Self-pay | Admitting: Surgery

## 2021-08-02 ENCOUNTER — Ambulatory Visit (INDEPENDENT_AMBULATORY_CARE_PROVIDER_SITE_OTHER): Payer: Medicaid Other | Admitting: Surgery

## 2021-08-02 VITALS — BP 140/90 | HR 80 | Temp 98.4°F | Ht 68.0 in | Wt 328.2 lb

## 2021-08-02 DIAGNOSIS — Z6841 Body Mass Index (BMI) 40.0 and over, adult: Secondary | ICD-10-CM | POA: Diagnosis not present

## 2021-08-02 DIAGNOSIS — K8010 Calculus of gallbladder with chronic cholecystitis without obstruction: Secondary | ICD-10-CM | POA: Diagnosis not present

## 2021-08-02 DIAGNOSIS — E1165 Type 2 diabetes mellitus with hyperglycemia: Secondary | ICD-10-CM | POA: Insufficient documentation

## 2021-08-02 NOTE — Progress Notes (Signed)
Patient ID: Sabrina Blevins, female   DOB: 01/11/1996, 26 y.o.   MRN: [id number]  Chief Complaint: Right upper quadrant pain  History of Present Illness Sabrina Blevins is a 26 y.o. female with a multiyear history of known gallstones, she had had some hard times, and had not pursued cholecystectomy.  Of late she her diet has been limited to essentially fruit and water.  She denies fevers and chills, she denies history of jaundice or hepatitis.  She is recently evaluated for an episode of pain that was a 10 out of 10.  She had an ultrasound which confirmed uncomplicated cholelithiasis.  She appears to be a type II diabetic secondary to morbid obesity which is currently uncontrolled.  Having been seen in the ED, the primary care provider referred to was booked through November.  Past Medical History Past Medical History:  Diagnosis Date   ADHD    Airborne allergy, current reaction    Asthma    Bipolar 1 disorder ([place])    Bipolar disorder ([place])    Cholecystitis    Morbid obesity ([place])    Seizures ([place])       Past Surgical History:  Procedure Laterality Date   ADENOIDECTOMY     TONSILLECTOMY      Allergies  Allergen Reactions   Bee Venom Anaphylaxis   Cinnamon Anaphylaxis   Contrast Media [Iodinated Contrast Media] Anaphylaxis   Depakote [Valproic Acid] Other (See Comments)    Lowers blood glucose quickly    Motrin [Ibuprofen] Anaphylaxis   Nutmeg Oil ([place] Oil) Anaphylaxis   Penicillins Anaphylaxis    Has patient had a PCN reaction causing immediate rash, facial/tongue/throat swelling, SOB or lightheadedness with hypotension: Yes Has patient had a PCN reaction causing severe rash involving mucus membranes or skin necrosis: No Has patient had a PCN reaction that required hospitalization: Unknown Has patient had a PCN reaction occurring within the last 10 years: Unknown If all of the above answers are "NO", then may proceed with Cephalosporin use.    Benadryl  [Diphenhydramine Hcl] Swelling    Pt states reaction is to GENERIC formula only - throat swelling - tolerates liquid benadryl and liquigels   Oxcarbazepine Other (See Comments)    Low bp, "bottoms out"   Topamax [Topiramate] Other (See Comments)    Drops BP per mother    Current Outpatient Medications  Medication Sig Dispense Refill   cetirizine (ZYRTEC ALLERGY) 10 MG tablet Take 1 tablet (10 mg total) by mouth daily for 14 days. 14 tablet 0   diphenhydrAMINE HCl (BENADRYL PO) Take 2 capsules by mouth once.     EPINEPHrine 0.3 mg/0.3 mL IJ SOAJ injection Inject 0.3 mg into the muscle as needed for anaphylaxis. 1 each 0   Multiple Vitamins-Minerals (ADULT GUMMY PO) Take by mouth.     ondansetron (ZOFRAN-ODT) 4 MG disintegrating tablet Take 1 tablet (4 mg total) by mouth every 8 (eight) hours as needed. 20 tablet 0   No current facility-administered medications for this visit.    Family History History reviewed. No pertinent family history.    Social History Social History   Tobacco Use   Smoking status: Never   Smokeless tobacco: Never  Vaping Use   Vaping Use: Never used  Substance Use Topics   Alcohol use: Yes   Drug use: Never        Review of Systems  Constitutional: Negative.   HENT: Negative.    Eyes: Negative.   Respiratory: Negative.  Cardiovascular: Negative.   Gastrointestinal:  Positive for abdominal pain, nausea and vomiting.  Genitourinary: Negative.   Skin: Negative.   Neurological:  Positive for dizziness and headaches.  Psychiatric/Behavioral: Negative.        Physical Exam Blood pressure 140/90, pulse 80, temperature 98.4 F (36.9 C), temperature source Oral, height 5\' 8"  (1.727 m), weight (!) 328 lb 3.2 oz (148.9 kg), last menstrual period 06/22/2021, SpO2 99 %. Last Weight  Most recent update: 08/02/2021 10:58 AM    Weight  148.9 kg (328 lb 3.2 oz)               CONSTITUTIONAL: Well developed, and nourished, appropriately responsive  and aware without distress.  Morbidly obese.  EYES: Sclera non-icteric.   EARS, NOSE, MOUTH AND THROAT:  The oropharynx is clear. Oral mucosa is pink and moist.   Hearing is intact to voice.  NECK: Trachea is midline, and there is no jugular venous distension.  LYMPH NODES:  Lymph nodes in the neck are not enlarged. RESPIRATORY:  Lungs are clear, and breath sounds are equal bilaterally. Normal respiratory effort without pathologic use of accessory muscles. CARDIOVASCULAR: Heart is regular in rate and rhythm. GI: The abdomen is obese, soft, nontender, and nondistended. There were no palpable masses. I did not appreciate hepatosplenomegaly. There were normal bowel sounds. MUSCULOSKELETAL:  Symmetrical muscle tone appreciated in all four extremities.    SKIN: Skin turgor is normal. No pathologic skin lesions appreciated.  NEUROLOGIC:  Motor and sensation appear grossly normal.  Cranial nerves are grossly without defect. PSYCH:  Alert and oriented to person, place and time. Affect is appropriate for situation.  Data Reviewed I have personally reviewed what is currently available of the patient's imaging, recent labs and medical records.   Labs:     Latest Ref Rng & Units 07/20/2021   12:10 PM 07/19/2021    7:26 PM 05/06/2021    2:21 AM  CBC  WBC 4.0 - 10.5 K/uL 8.7  12.8  14.8   Hemoglobin 12.0 - 15.0 g/dL 05/08/2021  06.2  37.6   Hematocrit 36.0 - 46.0 % 46.1  45.5  42.6   Platelets 150 - 400 K/uL 289  377  345       Latest Ref Rng & Units 07/20/2021   12:10 PM 07/19/2021    7:26 PM 05/06/2021    2:21 AM  CMP  Glucose 70 - 99 mg/dL 05/08/2021  151  761   BUN 6 - 20 mg/dL 8  9  7    Creatinine 0.44 - 1.00 mg/dL 607   3.71   Sodium 135 - 145 mmol/L 135  139  134   Potassium 3.5 - 5.1 mmol/L 4.4  3.8  4.0   Chloride 98 - 111 mmol/L 102  101  102   CO2 22 - 32 mmol/L 21  25  19    Calcium 8.9 - 10.3 mg/dL 9.1  9.5  8.8   Total Protein 6.5 - 8.1 g/dL 7.5  7.7    Total Bilirubin 0.3 - 1.2 mg/dL 1.0   1.5    Alkaline Phos 38 - 126 U/L 122  100    AST 15 - 41 U/L 373  207    ALT 0 - 44 U/L 270  100        Imaging: Radiology review:   CLINICAL DATA:  Right upper quadrant pain for 24 hours with elevated labs.   EXAM: ULTRASOUND ABDOMEN LIMITED RIGHT UPPER QUADRANT   COMPARISON:  03/10/2021.   FINDINGS: Gallbladder:   Tiny gallstones measure up to 5 mm. No wall thickening or sonographic Murphy sign.   Common bile duct:   Diameter: 2 mm, within normal limits. No intrahepatic biliary ductal dilatation.   Liver:   Diffusely increased in echogenicity. Portal vein is patent on color Doppler imaging with normal direction of blood flow towards the liver.   Other: None.   IMPRESSION: 1. No acute findings. 2. Cholelithiasis. 3. Hepatic steatosis.     Electronically Signed   By: Leanna Battles M.D.   On: 07/20/2021 13:42 Within last 24 hrs: No results found.  Assessment    Chronic calculus cholecystitis with biliary colic  Patient Active Problem List   Diagnosis Date Noted   Class 3 severe obesity with body mass index (BMI) of 45.0 to 49.9 in adult [facility]) 08/02/2021   Type 2 diabetes mellitus with hyperglycemia, without long-term current use of insulin (HCC) 08/02/2021   CCC (chronic calculous cholecystitis) 08/02/2021   PTSD (post-traumatic stress disorder) 03/13/2018   UTI (urinary tract infection) 03/13/2018   Noncompliance with treatment 03/13/2018   Major depressive disorder, recurrent episode with mixed features (HCC) 06/03/2017   Suicidal ideation 06/02/2017    Plan    Robotic cholecystectomy with ICG imaging.  This was discussed thoroughly.  Optimal plan is for robotic cholecystectomy utilizing ICG imaging. Risks and benefits have been discussed with the patient which include but are not limited to anesthesia, bleeding, infection, biliary ductal injury, resulting in leak or stenosis, other associated unanticipated injuries affiliated with  laparoscopic surgery.   We discussed the ideal of having her other medical conditions well controlled prior to this procedure.  We also discussed weight loss in terms of long-term assistance with her control of her diabetes.  We also discussed the increased risk of infection and wound complications with her diabetes being out of control preoperatively.  However due to the patient's severe intolerance of various foods secondary to her right upper quadrant pain precipitated, I believe we need to proceed with surgery rather than wait for complication to occur. Reviewed that removing the gallbladder will only address the symptoms related to the gallbladder itself.  I believe there is the desire to proceed, accepting the risks with understanding.  Questions elicited and answered to satisfaction.    No guarantees ever expressed or implied.   Face-to-face time spent with the patient and accompanying care providers(if present) was 45 minutes, with more than 50% of the time spent counseling, educating, and coordinating care of the patient.    These notes generated with voice recognition software. I apologize for typographical errors.  Campbell Lerner M.D., FACS 08/03/2021, 2:41 PM

## 2021-08-02 NOTE — Patient Instructions (Signed)
Our surgery scheduler Pamala Hurry will call you within 24-48 hours to get you scheduled. If you have not heard from her after 48 hours, please call our office. Have the blue sheet available when she calls to write down important information.   If you have any concerns or questions, please feel free to call our office.   Cholelithiasis  Cholelithiasis happens when gallstones form in the gallbladder. The gallbladder stores bile. Bile is a fluid that helps digest fats. Bile can harden and form into gallstones. If they cause a blockage, they can cause pain (gallbladder attack). What are the causes? This condition may be caused by: Some blood diseases, such as sickle cell anemia. Too much of a fat-like substance (cholesterol) in your bile. Not enough bile salts in your bile. These salts help the body absorb and digest fats. The gallbladder not emptying fully or often enough. This is common in pregnant women. What increases the risk? The following factors may make you more likely to develop this condition: Being female. Being pregnant many times. Eating a lot of fried foods, fat, and refined carbs (refined carbohydrates). Being very overweight (obese). Being older than age 43. Using medicines with female hormones in them for a long time. Losing weight fast. Having gallstones in your family. Having some health problems, such as diabetes, Crohn's disease, or liver disease. What are the signs or symptoms? Often, there may be gallstones but no symptoms. These gallstones are called silent gallstones. If a gallstone causes a blockage, you may get sudden pain. The pain: Can be in the upper right part of your belly (abdomen). Normally comes at night or after you eat. Can last an hour or more. Can spread to your right shoulder, back, or chest. Can feel like discomfort, burning, or fullness in the upper part of your belly (indigestion). If the blockage lasts more than a few hours, you can get an  infection or swelling. You may: Feel like you may vomit. Vomit. Feel bloated. Have belly pain for 5 hours or more. Feel tender in your belly, often in the upper right part and under your ribs. Have fever or chills. Have skin or the white parts of your eyes turn yellow (jaundice). Have dark pee (urine) or pale poop (stool). How is this treated? Treatment for this condition depends on how bad you feel. If you have symptoms, you may need: Home care, if symptoms are not very bad. Do not eat for 12-24 hours. Drink only water and clear liquids. Start to eat simple or clear foods after 1 or 2 days. Try broths and crackers. You may need medicines for pain or stomach upset or both. If you have an infection, you will need antibiotics. A hospital stay, if you have very bad pain or a very bad infection. Surgery to remove your gallbladder. You may need this if: Gallstones keep coming back. You have very bad symptoms. Medicines to break up gallstones. Medicines: Are best for small gallstones. May be used for up to 6-12 months. A procedure to find and take out gallstones or to break up gallstones. Follow these instructions at home: Medicines Take over-the-counter and prescription medicines only as told by your doctor. If you were prescribed an antibiotic medicine, take it as told by your doctor. Do not stop taking the antibiotic even if you start to feel better. Ask your doctor if the medicine prescribed to you requires you to avoid driving or using machinery. Eating and drinking Drink enough fluid to keep your urine  pale yellow. Drink water or clear fluids. This is important when you have pain. Eat healthy foods. Choose: Fewer fatty foods, such as fried foods. Fewer refined carbs. Avoid breads and grains that are highly processed, such as white bread and white rice. Choose whole grains, such as whole-wheat bread and brown rice. More fiber. Almonds, fresh fruit, and beans are healthy  sources. General instructions Keep a healthy weight. Keep all follow-up visits as told by your doctor. This is important. Where to find more information [employer] of Diabetes and Digestive and Kidney Diseases: [employer] Contact a doctor if: You have sudden pain in the upper right part of your belly. Pain might spread to your right shoulder, back, or chest. You have been diagnosed with gallstones that have no symptoms and you get: Belly pain. Discomfort, burning, or fullness in the upper part of your abdomen. You have dark urine or pale stools. Get help right away if: You have sudden pain in the upper right part of your abdomen, and the pain lasts more than 2 hours. You have pain in your abdomen, and: It lasts more than 5 hours. It keeps getting worse. You have a fever or chills. You keep feeling like you may vomit. You keep vomiting. Your skin or the white parts of your eyes turn yellow. Summary Cholelithiasis happens when gallstones form in the gallbladder. This condition may be caused by a blood disease, too much of a fat-like substance in the bile, or not enough bile salts in bile. Treatment for this condition depends on how bad you feel. If you have symptoms, do not eat or drink. You may need medicines. You may need a hospital stay for very bad pain or a very bad infection. You may need surgery if gallstones keep coming back or if you have very bad symptoms. This information is not intended to replace advice given to you by your health care provider. Make sure you discuss any questions you have with your health care provider. Document Revised: 03/26/2019 Document Reviewed: 12/28/2018 Elsevier Patient Education  2023 [employer].

## 2021-08-03 ENCOUNTER — Telehealth: Payer: Self-pay | Admitting: Surgery

## 2021-08-03 NOTE — Telephone Encounter (Signed)
Left message for patient to call, please inform her of the following regarding scheduled surgery:   Pre-Admission date/time, COVID Testing date and Surgery date.  Surgery Date: 08/10/21 Preadmission Testing Date: 08/07/21 (phone 8a-1p) Covid Testing Date: Not needed.    Also patient will need to call at [phone], between 1-3:00pm the day before surgery, to find out what time to arrive for surgery.

## 2021-08-06 NOTE — Telephone Encounter (Signed)
Outgoing call is made again, left another message for patient to call.   ?

## 2021-08-07 ENCOUNTER — Ambulatory Visit: Payer: Self-pay | Admitting: Surgery

## 2021-08-07 ENCOUNTER — Encounter
Admission: RE | Admit: 2021-08-07 | Discharge: 2021-08-07 | Disposition: A | Discharge status: Home / Self Care | Payer: Medicaid Other | Source: Ambulatory Visit | Attending: Surgery | Admitting: Surgery

## 2021-08-07 VITALS — Ht 68.0 in | Wt 340.0 lb

## 2021-08-07 DIAGNOSIS — K8010 Calculus of gallbladder with chronic cholecystitis without obstruction: Secondary | ICD-10-CM

## 2021-08-07 DIAGNOSIS — E1165 Type 2 diabetes mellitus with hyperglycemia: Secondary | ICD-10-CM

## 2021-08-07 HISTORY — DX: Type 2 diabetes mellitus without complications: E11.9

## 2021-08-07 NOTE — Telephone Encounter (Signed)
Left another message for patient to call.

## 2021-08-07 NOTE — Patient Instructions (Signed)
Your procedure is scheduled on: Friday August 10, 2021. Report to [facility] inside [facility] 2nd floor, stop by admissions desk before getting on elevator.  To find out your arrival time please call [phone] between 1PM - 3PM on Thursday August 09, 2021.  Remember: Instructions that are not followed completely may result in serious medical risk,  up to and including death, or upon the discretion of your surgeon and anesthesiologist your  surgery may need to be rescheduled.     _X__ 1. Do not eat food after midnight the night before your procedure.                 No chewing gum or hard candies. You may drink clear liquids up to 2 hours                 before you are scheduled to arrive for your surgery- DO not drink clear                 liquids within 2 hours of the start of your surgery.                 Clear Liquids include:  water,   __X__2.  On the morning of surgery brush your teeth with toothpaste and water, you                may rinse your mouth with mouthwash if you wish.  Do not swallow any toothpaste or mouthwash.     _X__ 3.  No Alcohol for 24 hours before or after surgery.   _X__ 4.  Do Not Smoke or use e-cigarettes For 24 Hours Prior to Your Surgery.                 Do not use any chewable tobacco products for at least 6 hours prior to                 Surgery.  _X__  5.  Do not use any recreational drugs (marijuana, cocaine, heroin, ecstasy, MDMA or other)                For at least one week prior to your surgery.  Combination of these drugs with anesthesia                May have life threatening results.  ____  6.  Bring all medications with you on the day of surgery if instructed.   __X__  7.  Notify your doctor if there is any change in your medical condition      (cold, fever, infections).     Do not wear jewelry, make-up, hairpins, clips or nail polish. Do not wear lotions, powders, or perfumes. You may wear deodorant. Do not shave  48 hours prior to surgery. Men may shave face and neck. Do not bring valuables to the hospital.    [facility] is not responsible for any belongings or valuables.  Contacts, dentures or bridgework may not be worn into surgery. Leave your suitcase in the car. After surgery it may be brought to your room. For patients admitted to the hospital, discharge time is determined by your treatment team.   Patients discharged the day of surgery will not be allowed to drive home.   Make arrangements for someone to be with you for the first 24 hours of your Same Day Discharge.   __X__ Take these medicines the morning of surgery with A SIP OF WATER:  1. None   2.   3.   4.  5.  6.  ____ Fleet Enema (as directed)   _X___ Use CHG Soap (or wipes) as directed  ____ Use Benzoyl Peroxide Gel as instructed  ____ Use inhalers on the day of surgery  ____ Stop metformin 2 days prior to surgery    ____ Take 1/2 of usual insulin dose the night before surgery. No insulin the morning          of surgery.   ____ Call your PCP, cardiologist, or Pulmonologist if taking Coumadin/Plavix/aspirin and ask when to stop before your surgery.   __X__ One Week prior to surgery- Stop Anti-inflammatories such as Ibuprofen, Aleve, Advil, Motrin, meloxicam (MOBIC), diclofenac, etodolac, ketorolac, Toradol, Daypro, piroxicam, Goody's or BC powders. OK TO USE TYLENOL IF NEEDED   __X__ Stop supplements until after surgery.    ____ Bring C-Pap to the hospital.    If you have any questions regarding your pre-procedure instructions,  Please call Pre-admit Testing at [phone]

## 2021-08-07 NOTE — Telephone Encounter (Signed)
Apparently patient has arrived for preadmit, but has yet to call back to the office.

## 2021-08-08 ENCOUNTER — Encounter
Admission: RE | Admit: 2021-08-08 | Discharge: 2021-08-08 | Disposition: A | Discharge status: Home / Self Care | Payer: Medicaid Other | Source: Ambulatory Visit | Attending: Surgery | Admitting: Surgery

## 2021-08-08 ENCOUNTER — Encounter: Payer: Self-pay | Admitting: Urgent Care

## 2021-08-08 DIAGNOSIS — K8010 Calculus of gallbladder with chronic cholecystitis without obstruction: Secondary | ICD-10-CM | POA: Diagnosis not present

## 2021-08-08 DIAGNOSIS — Z01812 Encounter for preprocedural laboratory examination: Secondary | ICD-10-CM | POA: Diagnosis not present

## 2021-08-08 DIAGNOSIS — Z789 Other specified health status: Secondary | ICD-10-CM | POA: Diagnosis not present

## 2021-08-08 DIAGNOSIS — N926 Irregular menstruation, unspecified: Secondary | ICD-10-CM | POA: Insufficient documentation

## 2021-08-08 DIAGNOSIS — Z319 Encounter for procreative management, unspecified: Secondary | ICD-10-CM

## 2021-08-08 LAB — COMPREHENSIVE METABOLIC PANEL
ALT: 25 U/L (ref 0–44)
AST: 26 U/L (ref 15–41)
Albumin: 3.5 g/dL (ref 3.5–5.0)
Alkaline Phosphatase: 86 U/L (ref 38–126)
Anion gap: 10 (ref 5–15)
BUN: 9 mg/dL (ref 6–20)
CO2: 24 mmol/L (ref 22–32)
Calcium: 9.1 mg/dL (ref 8.9–10.3)
Chloride: 101 mmol/L (ref 98–111)
Creatinine, Ser: 0.68 mg/dL (ref 0.44–1.00)
GFR, Estimated: >60 mL/min (ref >60)
Glucose, Bld: 384 mg/dL — ABNORMAL HIGH (ref 70–99)
Potassium: 3.8 mmol/L (ref 3.5–5.1)
Sodium: 135 mmol/L (ref 135–145)
Total Bilirubin: 0.6 mg/dL (ref 0.3–1.2)
Total Protein: 7.1 g/dL (ref 6.5–8.1)

## 2021-08-08 LAB — CBC WITH DIFFERENTIAL/PLATELET
Abs Immature Granulocytes: 0.29 10*3/uL — ABNORMAL HIGH (ref 0.00–0.07)
Basophils Absolute: 0.1 10*3/uL (ref 0.0–0.1)
Basophils Relative: 1 %
Eosinophils Absolute: 0.2 10*3/uL (ref 0.0–0.5)
Eosinophils Relative: 1 %
HCT: 40.7 % (ref 36.0–46.0)
Hemoglobin: 13.6 g/dL (ref 12.0–15.0)
Immature Granulocytes: 2 %
Lymphocytes Relative: 23 %
Lymphs Abs: 3.5 10*3/uL (ref 0.7–4.0)
MCH: 27.3 pg (ref 26.0–34.0)
MCHC: 33.4 g/dL (ref 30.0–36.0)
MCV: 81.6 fL (ref 80.0–100.0)
Monocytes Absolute: 0.7 10*3/uL (ref 0.1–1.0)
Monocytes Relative: 5 %
Neutro Abs: 10.3 10*3/uL — ABNORMAL HIGH (ref 1.7–7.7)
Neutrophils Relative %: 68 %
Platelets: 352 10*3/uL (ref 150–400)
RBC: 4.99 MIL/uL (ref 3.87–5.11)
RDW: 13.2 % (ref 11.5–15.5)
WBC: 15.1 10*3/uL — ABNORMAL HIGH (ref 4.0–10.5)
nRBC: 0 % (ref 0.0–0.2)

## 2021-08-08 LAB — HCG, QUANTITATIVE, PREGNANCY: hCG, Beta Chain, Quant, S: 1 m[IU]/mL (ref <5)

## 2021-08-08 LAB — HEMOGLOBIN A1C
Hgb A1c MFr Bld: 9.2 % — ABNORMAL HIGH (ref 4.8–5.6)
Mean Plasma Glucose: 217.34 mg/dL

## 2021-08-08 NOTE — Progress Notes (Signed)
  Perioperative Services: Pre-Admission/Anesthesia Testing  Abnormal Lab Notification    Date: 08/08/21  Name: Sabrina Blevins MRN:   [id number]  Re: Abnormal labs noted during PAT appointment   Provider(s) Notified: Campbell Lerner, MD Notification mode: Routed and/or faxed via [facility]   ABNORMAL LAB VALUE(S): Lab Results  Component Value Date   GLUCOSE 384 (H) 08/08/2021    Clinical Notes:  Patient with a T2DM diagnosis. She is not currently any type of medications to control her disease. Patient formerly on parenteral liraglutide, however she self discontinued this medication citing that her "Medicaid copay was too high". This was in 12/2020. Patient had not had liraglutide for "at least" 6 months before this. A1c was obtained today and found to be elevated at 9.2%. In efforts to reduce her risk of developing SSI or other potential perioperative complications, this communication is being sent in order to determine if patient is deemed to have adequate medical optimization, including preoperative glycemic control.   With that being said, the benefit of improving glycemic control must be weighed against the overall risk associated with delaying a necessary elective surgery for this patient.  Quentin Mulling, MSN, APRN, FNP-C, CEN [facility]  Peri-operative Services Nurse Practitioner Phone: [phone] 08/08/21 1:44 PM

## 2021-08-09 ENCOUNTER — Telehealth: Payer: Self-pay | Admitting: Surgery

## 2021-08-09 ENCOUNTER — Ambulatory Visit: Payer: Self-pay | Admitting: *Deleted

## 2021-08-09 NOTE — Telephone Encounter (Signed)
Surgery for 08/10/21 is cancelled due to patient's A1c not in control.  Sabrina Blevins has already spoken with the patient and she has been made aware that her surgery will have to be cancelled for now until her A1c is under better control.  Sabrina Blevins is also in the process of working on getting patient in to see primary care doctor as patient is not established.  Working on getting her worked in somewhere quickly as possible.  Patient to follow up with Dr. Claudine Mouton, pending her seeing PCP.

## 2021-08-09 NOTE — Telephone Encounter (Signed)
Summary: Elevated levels   An assistant with [facility] called in to try and get patient established as soon as possible because she was initially schedule for a cholecystectomy tomorrow but her Glucose number  is 384 and her A1C is 9.2 so they had to cancel for now until her diabetes is under control. Please assist patient further at phone number [phone]       Chief Complaint: elevated glucose and no PCP Symptoms: urinary frequency, increased thirst. Elevated A1c 9.2 08/08/21. Glucose reported as 384. Not taking diabetic medication due to no provider to refill Rx per patient  Frequency: 08/08/21 Pertinent Negatives: Patient denies dizziness, difficulty breathing, weakness Disposition: [x] ED /[] Urgent Care (no appt availability in office) / [] Appointment(In office/virtual)/ []  [facility]/ [] Home Care/ [] Refused Recommended Disposition /[] [facility]/ []  Follow-up with PCP Additional Notes:   Attempted to find PCP for patient . No availability to be see by Providers accepting new patient in [place] until November or December. Advised patient to go to ED for management of elevated glucose and request medication until PCP is established. Not sure of patient disposition at this time.           Reason for Disposition  Patient sounds very sick or weak to the triager  Answer Assessment - Initial Assessment Questions 1. BLOOD GLUCOSE: "What is your blood glucose level?"      Last reported 384 and A1c 9.2 per [facility]  2. ONSET: "When did you check the blood glucose?"     na 3. USUAL RANGE: "What is your glucose level usually?" (e.g., usual fasting morning value, usual evening value)     Patient is not checking glucose  4. KETONES: "Do you check for ketones (urine or blood test strips)?" If yes, ask: "What does the test show now?"      na 5. TYPE 1 or 2:  "Do you know what type of diabetes you have?"  (e.g., Type 1, Type 2, Gestational;  doesn't know)      na 6. INSULIN: "Do you take insulin?" "What type of insulin(s) do you use? What is the mode of delivery? (syringe, pen; injection or pump)?"      Yes does not have refills  7. DIABETES PILLS: "Do you take any pills for your diabetes?" If yes, ask: "Have you missed taking any pills recently?"     na 8. OTHER SYMPTOMS: "Do you have any symptoms?" (e.g., fever, frequent urination, difficulty breathing, dizziness, weakness, vomiting)     Frequent urination. Increase  thirst 9. PREGNANCY: "Is there any chance you are pregnant?" "When was your last menstrual period?"     na  Protocols used: Diabetes - High Blood Sugar-A-AH

## 2021-08-10 ENCOUNTER — Encounter: Admission: RE | Payer: Self-pay | Source: Home / Self Care

## 2021-08-10 ENCOUNTER — Emergency Department
Admission: EM | Admit: 2021-08-10 | Discharge: 2021-08-10 | Disposition: A | Discharge status: Home / Self Care | Payer: Medicaid Other | Source: Home / Self Care | Attending: Student in an Organized Health Care Education/Training Program | Admitting: Student in an Organized Health Care Education/Training Program

## 2021-08-10 ENCOUNTER — Ambulatory Visit: Admission: RE | Admit: 2021-08-10 | Payer: Medicaid Other | Source: Home / Self Care | Admitting: Surgery

## 2021-08-10 ENCOUNTER — Emergency Department: Payer: Medicaid Other

## 2021-08-10 ENCOUNTER — Other Ambulatory Visit: Payer: Self-pay

## 2021-08-10 DIAGNOSIS — R112 Nausea with vomiting, unspecified: Secondary | ICD-10-CM | POA: Insufficient documentation

## 2021-08-10 DIAGNOSIS — R1011 Right upper quadrant pain: Secondary | ICD-10-CM | POA: Insufficient documentation

## 2021-08-10 DIAGNOSIS — E1165 Type 2 diabetes mellitus with hyperglycemia: Secondary | ICD-10-CM | POA: Insufficient documentation

## 2021-08-10 DIAGNOSIS — R739 Hyperglycemia, unspecified: Secondary | ICD-10-CM

## 2021-08-10 LAB — URINALYSIS, ROUTINE W REFLEX MICROSCOPIC
Bilirubin Urine: NEGATIVE
Glucose, UA: >=500 mg/dL — AB
Ketones, ur: 5 mg/dL — AB
Leukocytes,Ua: NEGATIVE
Nitrite: NEGATIVE
Protein, ur: NEGATIVE mg/dL
RBC / HPF: >50 RBC/hpf — ABNORMAL HIGH (ref 0–5)
Specific Gravity, Urine: 1.014 (ref 1.005–1.030)
pH: 5 (ref 5.0–8.0)

## 2021-08-10 LAB — COMPREHENSIVE METABOLIC PANEL
ALT: 131 U/L — ABNORMAL HIGH (ref 0–44)
AST: 277 U/L — ABNORMAL HIGH (ref 15–41)
Albumin: 3.9 g/dL (ref 3.5–5.0)
Alkaline Phosphatase: 121 U/L (ref 38–126)
Anion gap: 10 (ref 5–15)
BUN: 10 mg/dL (ref 6–20)
CO2: 25 mmol/L (ref 22–32)
Calcium: 9.8 mg/dL (ref 8.9–10.3)
Chloride: 99 mmol/L (ref 98–111)
Creatinine, Ser: 0.63 mg/dL (ref 0.44–1.00)
GFR, Estimated: >60 mL/min (ref >60)
Glucose, Bld: 327 mg/dL — ABNORMAL HIGH (ref 70–99)
Potassium: 4.4 mmol/L (ref 3.5–5.1)
Sodium: 134 mmol/L — ABNORMAL LOW (ref 135–145)
Total Bilirubin: 1.9 mg/dL — ABNORMAL HIGH (ref 0.3–1.2)
Total Protein: 7.9 g/dL (ref 6.5–8.1)

## 2021-08-10 LAB — CBC
HCT: 44 % (ref 36.0–46.0)
Hemoglobin: 14.6 g/dL (ref 12.0–15.0)
MCH: 27.2 pg (ref 26.0–34.0)
MCHC: 33.2 g/dL (ref 30.0–36.0)
MCV: 82.1 fL (ref 80.0–100.0)
Platelets: 373 10*3/uL (ref 150–400)
RBC: 5.36 MIL/uL — ABNORMAL HIGH (ref 3.87–5.11)
RDW: 13.1 % (ref 11.5–15.5)
WBC: 11.9 10*3/uL — ABNORMAL HIGH (ref 4.0–10.5)
nRBC: 0 % (ref 0.0–0.2)

## 2021-08-10 LAB — CBG MONITORING, ED
Glucose-Capillary: 306 mg/dL — ABNORMAL HIGH (ref 70–99)
Glucose-Capillary: 235 mg/dL — ABNORMAL HIGH (ref 70–99)

## 2021-08-10 LAB — LIPASE, BLOOD: Lipase: 32 U/L (ref 11–51)

## 2021-08-10 LAB — POC URINE PREG, ED: Preg Test, Ur: NEGATIVE

## 2021-08-10 SURGERY — CHOLECYSTECTOMY, ROBOT-ASSISTED, LAPAROSCOPIC
Anesthesia: General

## 2021-08-10 MED ORDER — ONDANSETRON HCL 4 MG/2ML IJ SOLN
4.0000 mg | Freq: Once | INTRAMUSCULAR | Status: AC
  Administered 2021-08-10: 4 mg via INTRAVENOUS
  Filled 2021-08-10: qty 2

## 2021-08-10 MED ORDER — MORPHINE SULFATE (PF) 4 MG/ML IV SOLN
4.0000 mg | INTRAVENOUS | Status: DC | PRN
  Administered 2021-08-10: 4 mg via INTRAVENOUS
  Filled 2021-08-10: qty 1

## 2021-08-10 MED ORDER — ONDANSETRON 4 MG PO TBDP: 4.0000 mg | ORAL_TABLET | Freq: Three times a day (TID) | ORAL | 0 refills | Status: AC | PRN

## 2021-08-10 MED ORDER — SODIUM CHLORIDE 0.9 % IV BOLUS
1000.0000 mL | Freq: Once | INTRAVENOUS | Status: AC
  Administered 2021-08-10: 1000 mL via INTRAVENOUS

## 2021-08-10 MED ORDER — PANTOPRAZOLE SODIUM 40 MG IV SOLR
40.0000 mg | Freq: Once | INTRAVENOUS | Status: AC
  Administered 2021-08-10: 40 mg via INTRAVENOUS
  Filled 2021-08-10: qty 10

## 2021-08-10 MED ORDER — PANTOPRAZOLE SODIUM 20 MG PO TBEC: 20.0000 mg | DELAYED_RELEASE_TABLET | Freq: Every day | ORAL | 1 refills | Status: AC

## 2021-08-10 MED ORDER — INSULIN ASPART 100 UNIT/ML IJ SOLN
10.0000 [IU] | Freq: Once | INTRAMUSCULAR | Status: AC
  Administered 2021-08-10: 10 [IU] via SUBCUTANEOUS
  Filled 2021-08-10: qty 1

## 2021-08-10 MED ORDER — DICYCLOMINE HCL 10 MG PO CAPS
10.0000 mg | ORAL_CAPSULE | Freq: Once | ORAL | Status: AC
  Administered 2021-08-10: 10 mg via ORAL
  Filled 2021-08-10: qty 1

## 2021-08-11 ENCOUNTER — Emergency Department: Payer: Medicaid Other

## 2021-08-11 ENCOUNTER — Encounter: Payer: Self-pay | Admitting: Radiology

## 2021-08-11 ENCOUNTER — Inpatient Hospital Stay
Admission: EM | Admit: 2021-08-11 | Discharge: 2021-08-14 | DRG: 418 | Disposition: A | Discharge status: Home / Self Care | Payer: Medicaid Other | Attending: Hospitalist | Admitting: Hospitalist

## 2021-08-11 ENCOUNTER — Other Ambulatory Visit: Payer: Self-pay

## 2021-08-11 DIAGNOSIS — J4520 Mild intermittent asthma, uncomplicated: Secondary | ICD-10-CM | POA: Diagnosis present

## 2021-08-11 DIAGNOSIS — Z794 Long term (current) use of insulin: Secondary | ICD-10-CM | POA: Diagnosis not present

## 2021-08-11 DIAGNOSIS — Z79899 Other long term (current) drug therapy: Secondary | ICD-10-CM

## 2021-08-11 DIAGNOSIS — Z6841 Body Mass Index (BMI) 40.0 and over, adult: Secondary | ICD-10-CM | POA: Diagnosis not present

## 2021-08-11 DIAGNOSIS — Z888 Allergy status to other drugs, medicaments and biological substances status: Secondary | ICD-10-CM

## 2021-08-11 DIAGNOSIS — R7989 Other specified abnormal findings of blood chemistry: Secondary | ICD-10-CM | POA: Diagnosis present

## 2021-08-11 DIAGNOSIS — F319 Bipolar disorder, unspecified: Secondary | ICD-10-CM | POA: Diagnosis present

## 2021-08-11 DIAGNOSIS — Z880 Allergy status to penicillin: Secondary | ICD-10-CM

## 2021-08-11 DIAGNOSIS — Z87891 Personal history of nicotine dependence: Secondary | ICD-10-CM | POA: Diagnosis not present

## 2021-08-11 DIAGNOSIS — K8010 Calculus of gallbladder with chronic cholecystitis without obstruction: Principal | ICD-10-CM | POA: Diagnosis present

## 2021-08-11 DIAGNOSIS — K8020 Calculus of gallbladder without cholecystitis without obstruction: Secondary | ICD-10-CM

## 2021-08-11 DIAGNOSIS — F909 Attention-deficit hyperactivity disorder, unspecified type: Secondary | ICD-10-CM | POA: Diagnosis present

## 2021-08-11 DIAGNOSIS — Z91041 Radiographic dye allergy status: Secondary | ICD-10-CM

## 2021-08-11 DIAGNOSIS — Z7984 Long term (current) use of oral hypoglycemic drugs: Secondary | ICD-10-CM | POA: Diagnosis not present

## 2021-08-11 DIAGNOSIS — K8021 Calculus of gallbladder without cholecystitis with obstruction: Secondary | ICD-10-CM | POA: Diagnosis not present

## 2021-08-11 DIAGNOSIS — Z91148 Patient's other noncompliance with medication regimen for other reason: Secondary | ICD-10-CM | POA: Diagnosis not present

## 2021-08-11 DIAGNOSIS — E1165 Type 2 diabetes mellitus with hyperglycemia: Secondary | ICD-10-CM | POA: Diagnosis present

## 2021-08-11 DIAGNOSIS — K8050 Calculus of bile duct without cholangitis or cholecystitis without obstruction: Secondary | ICD-10-CM

## 2021-08-11 LAB — CBC WITH DIFFERENTIAL/PLATELET
Abs Immature Granulocytes: 0.2 10*3/uL — ABNORMAL HIGH (ref 0.00–0.07)
Basophils Absolute: 0.1 10*3/uL (ref 0.0–0.1)
Basophils Relative: 1 %
Eosinophils Absolute: 0.1 10*3/uL (ref 0.0–0.5)
Eosinophils Relative: 1 %
HCT: 41.4 % (ref 36.0–46.0)
Hemoglobin: 14 g/dL (ref 12.0–15.0)
Immature Granulocytes: 2 %
Lymphocytes Relative: 20 %
Lymphs Abs: 2.4 10*3/uL (ref 0.7–4.0)
MCH: 27.2 pg (ref 26.0–34.0)
MCHC: 33.8 g/dL (ref 30.0–36.0)
MCV: 80.5 fL (ref 80.0–100.0)
Monocytes Absolute: 0.7 10*3/uL (ref 0.1–1.0)
Monocytes Relative: 6 %
Neutro Abs: 8.5 10*3/uL — ABNORMAL HIGH (ref 1.7–7.7)
Neutrophils Relative %: 70 %
Platelets: 356 10*3/uL (ref 150–400)
RBC: 5.14 MIL/uL — ABNORMAL HIGH (ref 3.87–5.11)
RDW: 13.1 % (ref 11.5–15.5)
WBC: 11.9 10*3/uL — ABNORMAL HIGH (ref 4.0–10.5)
nRBC: 0 % (ref 0.0–0.2)

## 2021-08-11 LAB — COMPREHENSIVE METABOLIC PANEL
ALT: 228 U/L — ABNORMAL HIGH (ref 0–44)
AST: 380 U/L — ABNORMAL HIGH (ref 15–41)
Albumin: 3.7 g/dL (ref 3.5–5.0)
Alkaline Phosphatase: 135 U/L — ABNORMAL HIGH (ref 38–126)
Anion gap: 11 (ref 5–15)
BUN: 8 mg/dL (ref 6–20)
CO2: 24 mmol/L (ref 22–32)
Calcium: 9 mg/dL (ref 8.9–10.3)
Chloride: 100 mmol/L (ref 98–111)
Creatinine, Ser: 0.54 mg/dL (ref 0.44–1.00)
GFR, Estimated: >60 mL/min (ref >60)
Glucose, Bld: 309 mg/dL — ABNORMAL HIGH (ref 70–99)
Potassium: 3.6 mmol/L (ref 3.5–5.1)
Sodium: 135 mmol/L (ref 135–145)
Total Bilirubin: 2.5 mg/dL — ABNORMAL HIGH (ref 0.3–1.2)
Total Protein: 7.4 g/dL (ref 6.5–8.1)

## 2021-08-11 LAB — CBG MONITORING, ED
Glucose-Capillary: 288 mg/dL — ABNORMAL HIGH (ref 70–99)
Glucose-Capillary: 231 mg/dL — ABNORMAL HIGH (ref 70–99)
Glucose-Capillary: 192 mg/dL — ABNORMAL HIGH (ref 70–99)

## 2021-08-11 LAB — HEMOGLOBIN A1C
Hgb A1c MFr Bld: 9.1 % — ABNORMAL HIGH (ref 4.8–5.6)
Mean Plasma Glucose: 214.47 mg/dL

## 2021-08-11 LAB — URINALYSIS, ROUTINE W REFLEX MICROSCOPIC
Bilirubin Urine: NEGATIVE
Glucose, UA: >=500 mg/dL — AB
Ketones, ur: 20 mg/dL — AB
Leukocytes,Ua: NEGATIVE
Nitrite: NEGATIVE
Protein, ur: NEGATIVE mg/dL
Specific Gravity, Urine: 1.024 (ref 1.005–1.030)
pH: 5 (ref 5.0–8.0)

## 2021-08-11 LAB — GLUCOSE, CAPILLARY
Glucose-Capillary: 204 mg/dL — ABNORMAL HIGH (ref 70–99)
Glucose-Capillary: 163 mg/dL — ABNORMAL HIGH (ref 70–99)
Glucose-Capillary: 130 mg/dL — ABNORMAL HIGH (ref 70–99)

## 2021-08-11 LAB — LIPASE, BLOOD: Lipase: 35 U/L (ref 11–51)

## 2021-08-11 MED ORDER — OXYCODONE HCL 5 MG PO TABS
5.0000 mg | ORAL_TABLET | ORAL | Status: DC | PRN
  Administered 2021-08-11 – 2021-08-14 (×5): 5 mg via ORAL
  Filled 2021-08-11 (×5): qty 1

## 2021-08-11 MED ORDER — MORPHINE SULFATE (PF) 2 MG/ML IV SOLN
2.0000 mg | INTRAVENOUS | Status: DC | PRN
  Administered 2021-08-11 – 2021-08-13 (×8): 2 mg via INTRAVENOUS
  Filled 2021-08-11 (×8): qty 1

## 2021-08-11 MED ORDER — INSULIN ASPART 100 UNIT/ML IJ SOLN: 0.0000 [IU] | Freq: Three times a day (TID) | INTRAMUSCULAR | Status: DC

## 2021-08-11 MED ORDER — ONDANSETRON HCL 4 MG PO TABS: 4.0000 mg | ORAL_TABLET | Freq: Four times a day (QID) | ORAL | Status: DC | PRN

## 2021-08-11 MED ORDER — SODIUM CHLORIDE 0.9 % IV SOLN
INTRAVENOUS | Status: DC
Start: 2021-08-11 — End: 2021-08-14

## 2021-08-11 MED ORDER — ONDANSETRON HCL 4 MG/2ML IJ SOLN
4.0000 mg | INTRAMUSCULAR | Status: AC
  Administered 2021-08-11: 4 mg via INTRAVENOUS
  Filled 2021-08-11: qty 2

## 2021-08-11 MED ORDER — INSULIN ASPART 100 UNIT/ML IJ SOLN
0.0000 [IU] | INTRAMUSCULAR | Status: DC
  Administered 2021-08-11 (×2): 4 [IU] via SUBCUTANEOUS
  Administered 2021-08-11: 7 [IU] via SUBCUTANEOUS
  Administered 2021-08-11: 3 [IU] via SUBCUTANEOUS
  Administered 2021-08-12 (×3): 4 [IU] via SUBCUTANEOUS
  Administered 2021-08-12: 3 [IU] via SUBCUTANEOUS
  Filled 2021-08-11 (×8): qty 1

## 2021-08-11 MED ORDER — INSULIN GLARGINE-YFGN 100 UNIT/ML ~~LOC~~ SOLN
5.0000 [IU] | Freq: Once | SUBCUTANEOUS | Status: AC
  Administered 2021-08-11: 5 [IU] via SUBCUTANEOUS
  Filled 2021-08-11: qty 0.05

## 2021-08-11 MED ORDER — MORPHINE SULFATE (PF) 4 MG/ML IV SOLN
4.0000 mg | Freq: Once | INTRAVENOUS | Status: AC
  Administered 2021-08-11: 4 mg via INTRAVENOUS
  Filled 2021-08-11: qty 1

## 2021-08-11 MED ORDER — INSULIN GLARGINE-YFGN 100 UNIT/ML ~~LOC~~ SOLN
15.0000 [IU] | Freq: Every day | SUBCUTANEOUS | Status: DC
  Administered 2021-08-12 – 2021-08-14 (×2): 15 [IU] via SUBCUTANEOUS
  Filled 2021-08-11 (×3): qty 0.15

## 2021-08-11 MED ORDER — ONDANSETRON HCL 4 MG/2ML IJ SOLN
4.0000 mg | Freq: Four times a day (QID) | INTRAMUSCULAR | Status: DC | PRN
  Administered 2021-08-11 (×2): 4 mg via INTRAVENOUS
  Filled 2021-08-11 (×2): qty 2

## 2021-08-11 MED ORDER — PANTOPRAZOLE SODIUM 40 MG IV SOLR
40.0000 mg | INTRAVENOUS | Status: DC
  Administered 2021-08-12 – 2021-08-14 (×3): 40 mg via INTRAVENOUS
  Filled 2021-08-11 (×3): qty 10

## 2021-08-11 MED ORDER — INSULIN GLARGINE-YFGN 100 UNIT/ML ~~LOC~~ SOLN
10.0000 [IU] | Freq: Every day | SUBCUTANEOUS | Status: DC
  Administered 2021-08-11: 10 [IU] via SUBCUTANEOUS
  Filled 2021-08-11: qty 0.1

## 2021-08-11 MED ORDER — LACTATED RINGERS IV BOLUS
1000.0000 mL | Freq: Once | INTRAVENOUS | Status: AC
  Administered 2021-08-11: 1000 mL via INTRAVENOUS

## 2021-08-11 MED ORDER — PANTOPRAZOLE SODIUM 40 MG IV SOLR
40.0000 mg | Freq: Once | INTRAVENOUS | Status: AC
  Administered 2021-08-11: 40 mg via INTRAVENOUS
  Filled 2021-08-11: qty 10

## 2021-08-11 MED ORDER — BISACODYL 5 MG PO TBEC: 5.0000 mg | DELAYED_RELEASE_TABLET | Freq: Every day | ORAL | Status: DC | PRN

## 2021-08-11 MED ORDER — INSULIN ASPART 100 UNIT/ML IJ SOLN
5.0000 [IU] | Freq: Once | INTRAMUSCULAR | Status: AC
  Administered 2021-08-11: 5 [IU] via INTRAVENOUS
  Filled 2021-08-11: qty 1

## 2021-08-11 MED ORDER — INSULIN ASPART 100 UNIT/ML IJ SOLN: 0.0000 [IU] | Freq: Every day | INTRAMUSCULAR | Status: DC

## 2021-08-11 MED ORDER — ALBUTEROL SULFATE (2.5 MG/3ML) 0.083% IN NEBU
3.0000 mL | INHALATION_SOLUTION | Freq: Four times a day (QID) | RESPIRATORY_TRACT | Status: DC | PRN
Start: 2021-08-11 — End: 2021-08-14

## 2021-08-11 NOTE — ED Triage Notes (Signed)
Pt was seen earlier for RUQ pain, hyperglycemia. Pt states she has been in severe pain today. Pt states can't take the pain anymore. Pt states has been vomiting.

## 2021-08-12 DIAGNOSIS — K8021 Calculus of gallbladder without cholecystitis with obstruction: Secondary | ICD-10-CM | POA: Diagnosis not present

## 2021-08-12 LAB — HEPATIC FUNCTION PANEL
ALT: 241 U/L — ABNORMAL HIGH (ref 0–44)
AST: 171 U/L — ABNORMAL HIGH (ref 15–41)
Albumin: 2.8 g/dL — ABNORMAL LOW (ref 3.5–5.0)
Alkaline Phosphatase: 123 U/L (ref 38–126)
Bilirubin, Direct: 0.3 mg/dL — ABNORMAL HIGH (ref 0.0–0.2)
Indirect Bilirubin: 0.7 mg/dL (ref 0.3–0.9)
Total Bilirubin: 1 mg/dL (ref 0.3–1.2)
Total Protein: 5.8 g/dL — ABNORMAL LOW (ref 6.5–8.1)

## 2021-08-12 LAB — GLUCOSE, CAPILLARY
Glucose-Capillary: 121 mg/dL — ABNORMAL HIGH (ref 70–99)
Glucose-Capillary: 125 mg/dL — ABNORMAL HIGH (ref 70–99)
Glucose-Capillary: 155 mg/dL — ABNORMAL HIGH (ref 70–99)
Glucose-Capillary: 157 mg/dL — ABNORMAL HIGH (ref 70–99)
Glucose-Capillary: 175 mg/dL — ABNORMAL HIGH (ref 70–99)
Glucose-Capillary: 181 mg/dL — ABNORMAL HIGH (ref 70–99)

## 2021-08-12 LAB — CBC
HCT: 38.8 % (ref 36.0–46.0)
Hemoglobin: 12.8 g/dL (ref 12.0–15.0)
MCH: 27.8 pg (ref 26.0–34.0)
MCHC: 33 g/dL (ref 30.0–36.0)
MCV: 84.3 fL (ref 80.0–100.0)
Platelets: 295 10*3/uL (ref 150–400)
RBC: 4.6 MIL/uL (ref 3.87–5.11)
RDW: 13.3 % (ref 11.5–15.5)
WBC: 11.2 10*3/uL — ABNORMAL HIGH (ref 4.0–10.5)
nRBC: 0 % (ref 0.0–0.2)

## 2021-08-12 LAB — HIV ANTIBODY (ROUTINE TESTING W REFLEX): HIV Screen 4th Generation wRfx: NONREACTIVE

## 2021-08-12 MED ORDER — INSULIN ASPART 100 UNIT/ML IJ SOLN
0.0000 [IU] | Freq: Three times a day (TID) | INTRAMUSCULAR | Status: DC
  Administered 2021-08-13 – 2021-08-14 (×2): 7 [IU] via SUBCUTANEOUS
  Filled 2021-08-12 (×2): qty 1

## 2021-08-13 ENCOUNTER — Inpatient Hospital Stay: Payer: Medicaid Other | Admitting: Anesthesiology

## 2021-08-13 ENCOUNTER — Encounter
Admission: EM | Disposition: A | Discharge status: Home / Self Care | Payer: Self-pay | Source: Home / Self Care | Attending: Hospitalist

## 2021-08-13 ENCOUNTER — Other Ambulatory Visit: Payer: Self-pay

## 2021-08-13 ENCOUNTER — Encounter: Payer: Self-pay | Admitting: Internal Medicine

## 2021-08-13 DIAGNOSIS — K8010 Calculus of gallbladder with chronic cholecystitis without obstruction: Secondary | ICD-10-CM

## 2021-08-13 DIAGNOSIS — K8021 Calculus of gallbladder without cholecystitis with obstruction: Secondary | ICD-10-CM | POA: Diagnosis not present

## 2021-08-13 LAB — BASIC METABOLIC PANEL
Anion gap: 3 — ABNORMAL LOW (ref 5–15)
BUN: <5 mg/dL — ABNORMAL LOW (ref 6–20)
CO2: 27 mmol/L (ref 22–32)
Calcium: 8.3 mg/dL — ABNORMAL LOW (ref 8.9–10.3)
Chloride: 108 mmol/L (ref 98–111)
Creatinine, Ser: 0.48 mg/dL (ref 0.44–1.00)
GFR, Estimated: >60 mL/min (ref >60)
Glucose, Bld: 185 mg/dL — ABNORMAL HIGH (ref 70–99)
Potassium: 3.8 mmol/L (ref 3.5–5.1)
Sodium: 138 mmol/L (ref 135–145)

## 2021-08-13 LAB — GLUCOSE, CAPILLARY
Glucose-Capillary: 236 mg/dL — ABNORMAL HIGH (ref 70–99)
Glucose-Capillary: 287 mg/dL — ABNORMAL HIGH (ref 70–99)
Glucose-Capillary: 238 mg/dL — ABNORMAL HIGH (ref 70–99)
Glucose-Capillary: 306 mg/dL — ABNORMAL HIGH (ref 70–99)

## 2021-08-13 LAB — CBC
HCT: 37.9 % (ref 36.0–46.0)
Hemoglobin: 12 g/dL (ref 12.0–15.0)
MCH: 27.1 pg (ref 26.0–34.0)
MCHC: 31.7 g/dL (ref 30.0–36.0)
MCV: 85.6 fL (ref 80.0–100.0)
Platelets: 274 10*3/uL (ref 150–400)
RBC: 4.43 MIL/uL (ref 3.87–5.11)
RDW: 13.1 % (ref 11.5–15.5)
WBC: 10.4 10*3/uL (ref 4.0–10.5)
nRBC: 0 % (ref 0.0–0.2)

## 2021-08-13 LAB — MAGNESIUM: Magnesium: 1.9 mg/dL (ref 1.7–2.4)

## 2021-08-13 SURGERY — CHOLECYSTECTOMY, ROBOT-ASSISTED, LAPAROSCOPIC
Anesthesia: General

## 2021-08-13 MED ORDER — INDOCYANINE GREEN 25 MG IV SOLR
2.5000 mg | Freq: Once | INTRAVENOUS | Status: DC
  Filled 2021-08-13: qty 10

## 2021-08-13 MED ORDER — PROPOFOL 10 MG/ML IV BOLUS
INTRAVENOUS | Status: DC | PRN
  Administered 2021-08-13: 200 mg via INTRAVENOUS

## 2021-08-13 MED ORDER — SUGAMMADEX SODIUM 500 MG/5ML IV SOLN
INTRAVENOUS | Status: DC | PRN
  Administered 2021-08-13: 500 mg via INTRAVENOUS

## 2021-08-13 MED ORDER — CIPROFLOXACIN IN D5W 400 MG/200ML IV SOLN
400.0000 mg | INTRAVENOUS | Status: AC
  Administered 2021-08-13: 400 mg via INTRAVENOUS

## 2021-08-13 MED ORDER — PHENYLEPHRINE HCL (PRESSORS) 10 MG/ML IV SOLN
INTRAVENOUS | Status: AC
  Filled 2021-08-13: qty 1

## 2021-08-13 MED ORDER — INDOCYANINE GREEN 25 MG IV SOLR
INTRAVENOUS | Status: DC | PRN
  Administered 2021-08-13: 2.5 mg via INTRAVENOUS

## 2021-08-13 MED ORDER — BUPIVACAINE-EPINEPHRINE (PF) 0.25% -1:200000 IJ SOLN
INTRAMUSCULAR | Status: DC | PRN
  Administered 2021-08-13: 30 mL
  Administered 2021-08-13: 20 mL

## 2021-08-13 MED ORDER — ROCURONIUM BROMIDE 100 MG/10ML IV SOLN
INTRAVENOUS | Status: DC | PRN
  Administered 2021-08-13: 50 mg via INTRAVENOUS
  Administered 2021-08-13: 10 mg via INTRAVENOUS
  Administered 2021-08-13: 40 mg via INTRAVENOUS

## 2021-08-13 MED ORDER — DEXAMETHASONE SODIUM PHOSPHATE 10 MG/ML IJ SOLN
INTRAMUSCULAR | Status: DC | PRN
  Administered 2021-08-13: 5 mg via INTRAVENOUS

## 2021-08-13 MED ORDER — ACETAMINOPHEN 10 MG/ML IV SOLN
INTRAVENOUS | Status: AC
  Filled 2021-08-13: qty 100

## 2021-08-13 MED ORDER — FENTANYL CITRATE (PF) 100 MCG/2ML IJ SOLN
25.0000 ug | INTRAMUSCULAR | Status: DC | PRN
  Administered 2021-08-13 (×3): 25 ug via INTRAVENOUS

## 2021-08-13 MED ORDER — 0.9 % SODIUM CHLORIDE (POUR BTL) OPTIME
TOPICAL | Status: DC | PRN
  Administered 2021-08-13: 120 mL

## 2021-08-13 MED ORDER — ORAL CARE MOUTH RINSE: 15.0000 mL | Freq: Once | OROMUCOSAL | Status: DC

## 2021-08-13 MED ORDER — INSULIN ASPART 100 UNIT/ML IJ SOLN
4.0000 [IU] | Freq: Once | INTRAMUSCULAR | Status: AC
  Administered 2021-08-13: 4 [IU] via SUBCUTANEOUS

## 2021-08-13 MED ORDER — MIDAZOLAM HCL 2 MG/2ML IJ SOLN
INTRAMUSCULAR | Status: DC | PRN
  Administered 2021-08-13: 2 mg via INTRAVENOUS

## 2021-08-13 MED ORDER — CHLORHEXIDINE GLUCONATE 0.12 % MT SOLN: 15.0000 mL | Freq: Once | OROMUCOSAL | Status: DC

## 2021-08-13 MED ORDER — FENTANYL CITRATE (PF) 100 MCG/2ML IJ SOLN
INTRAMUSCULAR | Status: AC
  Administered 2021-08-13: 25 ug via INTRAVENOUS
  Filled 2021-08-13: qty 2

## 2021-08-13 MED ORDER — LIDOCAINE HCL (CARDIAC) PF 100 MG/5ML IV SOSY
PREFILLED_SYRINGE | INTRAVENOUS | Status: DC | PRN
  Administered 2021-08-13: 100 mg via INTRAVENOUS

## 2021-08-13 MED ORDER — CIPROFLOXACIN IN D5W 400 MG/200ML IV SOLN
INTRAVENOUS | Status: AC
  Filled 2021-08-13: qty 200

## 2021-08-13 MED ORDER — FENTANYL CITRATE (PF) 100 MCG/2ML IJ SOLN
INTRAMUSCULAR | Status: AC
  Filled 2021-08-13: qty 2

## 2021-08-13 MED ORDER — ACETAMINOPHEN 10 MG/ML IV SOLN
INTRAVENOUS | Status: DC | PRN
  Administered 2021-08-13: 1000 mg via INTRAVENOUS

## 2021-08-13 MED ORDER — CIPROFLOXACIN IN D5W 400 MG/200ML IV SOLN: 400.0000 mg | Freq: Once | INTRAVENOUS | Status: DC

## 2021-08-13 MED ORDER — MIDAZOLAM HCL 2 MG/2ML IJ SOLN
INTRAMUSCULAR | Status: AC
  Filled 2021-08-13: qty 2

## 2021-08-13 MED ORDER — SUCCINYLCHOLINE CHLORIDE 200 MG/10ML IV SOSY
PREFILLED_SYRINGE | INTRAVENOUS | Status: DC | PRN
  Administered 2021-08-13: 200 mg via INTRAVENOUS

## 2021-08-13 MED ORDER — ONDANSETRON HCL 4 MG/2ML IJ SOLN
INTRAMUSCULAR | Status: DC | PRN
  Administered 2021-08-13 (×2): 4 mg via INTRAVENOUS

## 2021-08-13 MED ORDER — PROPOFOL 10 MG/ML IV BOLUS
INTRAVENOUS | Status: AC
  Filled 2021-08-13: qty 20

## 2021-08-13 MED ORDER — SODIUM CHLORIDE 0.9 % IV SOLN: INTRAVENOUS | Status: DC

## 2021-08-13 MED ORDER — FENTANYL CITRATE (PF) 100 MCG/2ML IJ SOLN
INTRAMUSCULAR | Status: DC | PRN
Start: 2021-08-13 — End: 2021-08-13
  Administered 2021-08-13 (×2): 50 ug via INTRAVENOUS
  Administered 2021-08-13: 100 ug via INTRAVENOUS
  Administered 2021-08-13: 50 ug via INTRAVENOUS

## 2021-08-13 MED ORDER — FAMOTIDINE 20 MG PO TABS: 20.0000 mg | ORAL_TABLET | Freq: Once | ORAL | Status: DC

## 2021-08-13 MED ORDER — SODIUM CHLORIDE 0.9 % IR SOLN
Status: DC | PRN
  Administered 2021-08-13: 3000 mL

## 2021-08-13 MED ORDER — SUCCINYLCHOLINE CHLORIDE 200 MG/10ML IV SOSY: PREFILLED_SYRINGE | INTRAVENOUS | Status: DC | PRN

## 2021-08-13 MED ORDER — ONDANSETRON HCL 4 MG/2ML IJ SOLN: 4.0000 mg | Freq: Once | INTRAMUSCULAR | Status: DC | PRN

## 2021-08-13 MED ORDER — INSULIN ASPART 100 UNIT/ML IJ SOLN
INTRAMUSCULAR | Status: AC
  Filled 2021-08-13: qty 1

## 2021-08-13 SURGICAL SUPPLY — 48 items
"PENCIL ELECTRO HAND CTR " (MISCELLANEOUS) ×2 IMPLANT
ADH SKN CLS APL DERMABOND .7 (GAUZE/BANDAGES/DRESSINGS) ×2
BAG PRESSURE INF REUSE 3000 (BAG) IMPLANT
CLIP LIGATING HEM O LOK PURPLE (MISCELLANEOUS) ×4 IMPLANT
COVER TIP SHEARS 8 DVNC (MISCELLANEOUS) ×2 IMPLANT
COVER TIP SHEARS 8MM DA VINCI (MISCELLANEOUS) ×1
DERMABOND ADVANCED (GAUZE/BANDAGES/DRESSINGS) ×1
DERMABOND ADVANCED .7 DNX12 (GAUZE/BANDAGES/DRESSINGS) ×2 IMPLANT
DRAPE ARM DVNC X/XI (DISPOSABLE) ×8 IMPLANT
DRAPE COLUMN DVNC XI (DISPOSABLE) ×2 IMPLANT
DRAPE DA VINCI XI ARM (DISPOSABLE) ×4
DRAPE DA VINCI XI COLUMN (DISPOSABLE) ×1
ELECT CAUTERY BLADE 6.4 (BLADE) ×3 IMPLANT
GLOVE ORTHO TXT STRL SZ7.5 (GLOVE) ×6 IMPLANT
GOWN STRL REUS W/ TWL LRG LVL3 (GOWN DISPOSABLE) ×4 IMPLANT
GOWN STRL REUS W/ TWL XL LVL3 (GOWN DISPOSABLE) ×4 IMPLANT
GOWN STRL REUS W/TWL LRG LVL3 (GOWN DISPOSABLE) ×6
GOWN STRL REUS W/TWL XL LVL3 (GOWN DISPOSABLE) ×6
GRASPER SUT TROCAR 14GX15 (MISCELLANEOUS) IMPLANT
IRRIGATION STRYKERFLOW (MISCELLANEOUS) IMPLANT
IRRIGATOR STRYKERFLOW (MISCELLANEOUS)
IRRIGATOR SUCT 8 DISP DVNC XI (IRRIGATION / IRRIGATOR) IMPLANT
IRRIGATOR SUCTION 8MM XI DISP (IRRIGATION / IRRIGATOR) ×1
IV NS IRRIG 3000ML ARTHROMATIC (IV SOLUTION) IMPLANT
KIT PINK PAD W/HEAD ARE REST (MISCELLANEOUS) ×3 IMPLANT
KIT PINK PAD W/HEAD ARM REST (MISCELLANEOUS) ×2 IMPLANT
KIT TURNOVER KIT A (KITS) ×3 IMPLANT
LABEL OR SOLS (LABEL) ×3 IMPLANT
MANIFOLD NEPTUNE II (INSTRUMENTS) ×3 IMPLANT
NDL INSUFF 14G 150MM VS150000 (NEEDLE) IMPLANT
NEEDLE HYPO 22GX1.5 SAFETY (NEEDLE) ×3 IMPLANT
NEEDLE INSUFFLATION 150MM (ENDOMECHANICALS) ×1 IMPLANT
NS IRRIG 500ML POUR BTL (IV SOLUTION) ×3 IMPLANT
PACK LAP CHOLECYSTECTOMY (MISCELLANEOUS) ×3 IMPLANT
PENCIL ELECTRO HAND CTR (MISCELLANEOUS) ×3 IMPLANT
SEAL CANN UNIV 5-8 DVNC XI (MISCELLANEOUS) ×8 IMPLANT
SEAL XI 5MM-8MM UNIVERSAL (MISCELLANEOUS) ×4
SET TUBE SMOKE EVAC HIGH FLOW (TUBING) ×3 IMPLANT
SOLUTION ELECTROLUBE (MISCELLANEOUS) ×3 IMPLANT
SPIKE FLUID TRANSFER (MISCELLANEOUS) ×3 IMPLANT
SUT MNCRL 4-0 (SUTURE) ×6
SUT MNCRL 4-0 27XMFL (SUTURE) ×4
SUT VICRYL 0 AB UR-6 (SUTURE) ×3 IMPLANT
SUTURE MNCRL 4-0 27XMF (SUTURE) ×2 IMPLANT
SYS BAG RETRIEVAL 10MM (BASKET) ×3
SYSTEM BAG RETRIEVAL 10MM (BASKET) ×2 IMPLANT
TROCAR Z-THREAD FIOS 11X100 BL (TROCAR) ×1 IMPLANT
WATER STERILE IRR 500ML POUR (IV SOLUTION) ×3 IMPLANT

## 2021-08-13 NOTE — Progress Notes (Signed)
PROGRESS NOTE    Sabrina Blevins  ZOX:[id number] DOB: 06-27-1995 DOA: 08/11/2021 PCP: Pcp, No  211A/211A-AA   Assessment & Plan:   Principal Problem:   Cholelithiases   Sabrina Blevins is a 26 y.o. female with medical history significant of morbid obesity, diabetes noncompliant with Victoza, bipolar disorder, mild intermittent asthma, presented with worsening of RUQ abdominal pain.   Patient has chronic recurrent symptomatic gallstone, for the last 6+ month, when she has had multiple episodes of RUQ pain and she has been followed by general surgery.  Most recent flareup was last week when the patient developed RUQ cramping-like abdominal pain, worsening with eating regular food, denies any fever or chills.  Feeling nauseous but no vomiting no diarrhea.  Yesterday, surgeon scheduled patient for elective cholecystectomy, however it was found the patient glucose has been poorly controlled fingerstick> 300 and anesthesia had to cancel the procedure.  Overnight, patient came back to ED complaining about worsening of RUQ abdominal pain, blood work showed trending up of LFTs and bilirubin.  Patient was given insulin, multiple bouts of morphine and Zofran overnight.  And MRCP negative for choledocholithiasis.   Symptomatic cholelithiasis --MRCP negative for choledocholithiasis Plan: --lap chole today --clear liquid diet --cont MIVF@50  --pain control per current regimen  Acute transaminitis, improved Acute bilirubinemia, improved -suspect passing of CBD stone -Hold off statins   IDDM, poorly controlled with hyperglycemia -Noncompliant with diabetes medications, A1c 9.2. Plan: --cont glargine 15u daily --ACHS and SSI   Morbid obesity --advised to establish with outpatient weight loss center   DVT prophylaxis: SCD/Compression stockings Code Status: Full code  Family Communication: spouse updated at bedside today Level of care: Med-Surg Dispo:   The patient is from:  home Anticipated d/c is to: home Anticipated d/c date is: tomorrow   Subjective and Interval History:  Pt underwent lap chole today.  Had significant pain afterwards.   Objective: Vitals:   08/13/21 1230 08/13/21 1252 08/13/21 1313 08/13/21 1421  BP: (!) 153/95 140/83 (!) 147/97 (!) 139/96  Pulse: 90 94 99 96  Resp: 14 14 16 16   Temp:  98.1 F (36.7 C) 98.6 F (37 C) 98.7 F (37.1 C)  TempSrc:  Oral Oral Oral  SpO2: 96% 96% 97% 93%  Weight:      Height:        Intake/Output Summary (Last 24 hours) at 08/13/2021 1607 Last data filed at 08/13/2021 1416 Gross per 24 hour  Intake 3861.6 ml  Output 2 ml  Net 3859.6 ml   Filed Weights   08/11/21 0006  Weight: (!) 150 kg    Examination:   Constitutional: NAD, AAOx3 HEENT: conjunctivae and lids normal, EOMI CV: No cyanosis.   RESP: normal respiratory effort, on RA Neuro: II - XII grossly intact.   Psych: depressed mood and affect.  Appropriate judgement and reason   Data Reviewed: I have personally reviewed following labs and imaging studies  CBC: Recent Labs  Lab 08/08/21 1003 08/10/21 1105 08/11/21 0102 08/12/21 0551 08/13/21 0505  WBC 15.1* 11.9* 11.9* 11.2* 10.4  NEUTROABS 10.3*  --  8.5*  --   --   HGB 13.6 14.6 14.0 12.8 12.0  HCT 40.7 44.0 41.4 38.8 37.9  MCV 81.6 82.1 80.5 84.3 85.6  PLT 352 373 356 295 274   Basic Metabolic Panel: Recent Labs  Lab 08/08/21 1003 08/10/21 1105 08/11/21 0102 08/13/21 0505  NA 135 134* 135 138  K 3.8 4.4 3.6 3.8  CL 101 99 100 108  CO2 24 25 24 27   GLUCOSE 384* 327* 309* 185*  BUN 9 10 8  <5*  CREATININE 0.68 0.63 0.54 0.48  CALCIUM 9.1 9.8 9.0 8.3*  MG  --   --   --  1.9   GFR: Estimated Creatinine Clearance: 166.8 mL/min (by C-G formula based on SCr of 0.48 mg/dL). Liver Function Tests: Recent Labs  Lab 08/08/21 1003 08/10/21 1105 08/11/21 0102 08/12/21 0551  AST 26 277* 380* 171*  ALT 25 131* 228* 241*  ALKPHOS 86 121 135* 123  BILITOT 0.6 1.9*  2.5* 1.0  PROT 7.1 7.9 7.4 5.8*  ALBUMIN 3.5 3.9 3.7 2.8*   Recent Labs  Lab 08/10/21 1105 08/11/21 0102  LIPASE 32 35   No results for input(s): "AMMONIA" in the last 168 hours. Coagulation Profile: No results for input(s): "INR", "PROTIME" in the last 168 hours. Cardiac Enzymes: No results for input(s): "CKTOTAL", "CKMB", "CKMBINDEX", "TROPONINI" in the last 168 hours. BNP (last 3 results) No results for input(s): "PROBNP" in the last 8760 hours. HbA1C: Recent Labs    08/11/21 0102  HGBA1C 9.1*   CBG: Recent Labs  Lab 08/12/21 1136 08/12/21 1538 08/12/21 2212 08/13/21 1124 08/13/21 1227  GLUCAP 157* 125* 175* 236* 287*   Lipid Profile: No results for input(s): "CHOL", "HDL", "LDLCALC", "TRIG", "CHOLHDL", "LDLDIRECT" in the last 72 hours. Thyroid Function Tests: No results for input(s): "TSH", "T4TOTAL", "FREET4", "T3FREE", "THYROIDAB" in the last 72 hours. Anemia Panel: No results for input(s): "VITAMINB12", "FOLATE", "FERRITIN", "TIBC", "IRON", "RETICCTPCT" in the last 72 hours. Sepsis Labs: No results for input(s): "PROCALCITON", "LATICACIDVEN" in the last 168 hours.  No results found for this or any previous visit (from the past 240 hour(s)).    Radiology Studies: No results found.   Scheduled Meds:  insulin aspart       insulin aspart  0-20 Units Subcutaneous TID WC   insulin glargine-yfgn  15 Units Subcutaneous Daily   pantoprazole (PROTONIX) IV  40 mg Intravenous Q24H   Continuous Infusions:  sodium chloride 100 mL/hr at 08/13/21 1302   sodium chloride       LOS: 2 days     Darlin Priestly, MD [facility] If 7PM-7AM, please contact night-coverage 08/13/2021, 4:07 PM

## 2021-08-13 NOTE — TOC Initial Note (Addendum)
Transition of Care [facility]) - Initial/Assessment Note    Patient Details  Name: Sabrina Blevins MRN: [id number] Date of Birth: 11-21-1995  Transition of Care [facility]) CM/SW Contact:    Chapman Fitch, RN Phone Number: 08/13/2021, 3:06 PM  Clinical Narrative:                       Transition of Care [facility]) Screening Note   Patient Details  Name: Sabrina Blevins Date of Birth: 1995-08-26   Transition of Care [facility]) CM/SW Contact:    Chapman Fitch, RN Phone Number: 08/13/2021, 3:06 PM    Transition of Care Department [facility]) has reviewed patient and no TOC needs have been identified at this time. We will continue to monitor patient advancement through interdisciplinary progression rounds. If new patient transition needs arise, please place a TOC consult.   No PCP listed. Encouraged patient to follow up with medicaid to determine who her assigned provided is   Patient Goals and CMS Choice        Expected Discharge Plan and Services                                                Prior Living Arrangements/Services                       Activities of Daily Living Home Assistive Devices/Equipment: None ADL Screening (condition at time of admission) Patient's cognitive ability adequate to safely complete daily activities?: Yes Is the patient deaf or have difficulty hearing?: No Does the patient have difficulty seeing, even when wearing glasses/contacts?: No Does the patient have difficulty concentrating, remembering, or making decisions?: No Patient able to express need for assistance with ADLs?: No Does the patient have difficulty dressing or bathing?: No Independently performs ADLs?: Yes (appropriate for developmental age) Does the patient have difficulty walking or climbing stairs?: Yes Weakness of Legs: None Weakness of Arms/Hands: None  Permission Sought/Granted                  Emotional Assessment               Admission diagnosis:  Morbid obesity (HCC) [E66.01] Biliary colic [K80.50] Hyperbilirubinemia [E80.6] Cholelithiases [K80.20] Elevated LFTs [R79.89] Poorly controlled diabetes mellitus (HCC) [E11.65] Patient Active Problem List   Diagnosis Date Noted   Cholelithiases 08/11/2021   Class 3 severe obesity with body mass index (BMI) of 45.0 to 49.9 in adult (HCC) 08/02/2021   Type 2 diabetes mellitus with hyperglycemia, without long-term current use of insulin (HCC) 08/02/2021   CCC (chronic calculous cholecystitis) 08/02/2021   PTSD (post-traumatic stress disorder) 03/13/2018   UTI (urinary tract infection) 03/13/2018   Noncompliance with treatment 03/13/2018   Major depressive disorder, recurrent episode with mixed features (HCC) 06/03/2017   Suicidal ideation 06/02/2017   PCP:  Oneita Hurt, No Pharmacy:   [id number] [address], [place] - [place] AT [facility] [address] [place] [address] Phone: [phone] Fax: [phone]     Social Determinants of Health (SDOH) Interventions    Readmission Risk Interventions     No data to display

## 2021-08-14 DIAGNOSIS — K8021 Calculus of gallbladder without cholecystitis with obstruction: Secondary | ICD-10-CM | POA: Diagnosis not present

## 2021-08-14 LAB — SURGICAL PATHOLOGY

## 2021-08-14 LAB — BASIC METABOLIC PANEL
Anion gap: 6 (ref 5–15)
BUN: 5 mg/dL — ABNORMAL LOW (ref 6–20)
CO2: 23 mmol/L (ref 22–32)
Calcium: 8.5 mg/dL — ABNORMAL LOW (ref 8.9–10.3)
Chloride: 109 mmol/L (ref 98–111)
Creatinine, Ser: 0.47 mg/dL (ref 0.44–1.00)
GFR, Estimated: >60 mL/min (ref >60)
Glucose, Bld: 253 mg/dL — ABNORMAL HIGH (ref 70–99)
Potassium: 3.6 mmol/L (ref 3.5–5.1)
Sodium: 138 mmol/L (ref 135–145)

## 2021-08-14 LAB — CBC
HCT: 38.2 % (ref 36.0–46.0)
Hemoglobin: 12.5 g/dL (ref 12.0–15.0)
MCH: 27.7 pg (ref 26.0–34.0)
MCHC: 32.7 g/dL (ref 30.0–36.0)
MCV: 84.7 fL (ref 80.0–100.0)
Platelets: 307 10*3/uL (ref 150–400)
RBC: 4.51 MIL/uL (ref 3.87–5.11)
RDW: 13 % (ref 11.5–15.5)
WBC: 15.7 10*3/uL — ABNORMAL HIGH (ref 4.0–10.5)
nRBC: 0 % (ref 0.0–0.2)

## 2021-08-14 LAB — MAGNESIUM: Magnesium: 1.8 mg/dL (ref 1.7–2.4)

## 2021-08-14 LAB — GLUCOSE, CAPILLARY: Glucose-Capillary: 220 mg/dL — ABNORMAL HIGH (ref 70–99)

## 2021-08-14 MED ORDER — ORAL CARE MOUTH RINSE: 15.0000 mL | OROMUCOSAL | Status: DC | PRN

## 2021-08-14 MED ORDER — GLIPIZIDE 10 MG PO TABS: 10.0000 mg | ORAL_TABLET | Freq: Two times a day (BID) | ORAL | 2 refills | Status: AC

## 2021-08-14 MED ORDER — LIRAGLUTIDE 18 MG/3ML ~~LOC~~ SOPN: 0.6000 mg | PEN_INJECTOR | Freq: Every day | SUBCUTANEOUS | 2 refills | Status: AC

## 2021-08-14 MED ORDER — OXYCODONE-ACETAMINOPHEN 5-325 MG PO TABS
1.0000 | ORAL_TABLET | Freq: Four times a day (QID) | ORAL | 0 refills | Status: DC | PRN
Start: 2021-08-14 — End: 2021-09-04

## 2021-08-14 MED ORDER — CYCLOBENZAPRINE HCL 5 MG PO TABS
5.0000 mg | ORAL_TABLET | Freq: Three times a day (TID) | ORAL | 0 refills | Status: AC | PRN
Start: 2021-08-14 — End: ?

## 2021-08-14 MED ORDER — METFORMIN HCL 500 MG PO TABS: 1000.0000 mg | ORAL_TABLET | Freq: Two times a day (BID) | ORAL | 2 refills | Status: AC

## 2021-08-14 NOTE — Discharge Summary (Signed)
Physician Discharge Summary   Sabrina Blevins  female DOB: 1995/08/07  RJJ:[id number]  PCP: Pcp, No  Admit date: 08/11/2021 Discharge date: 08/14/2021  Admitted From: home Disposition:  home CODE STATUS: Full code  Discharge Instructions     No wound care   Complete by: As directed       Hospital Course:  For full details, please see H&P, progress notes, consult notes and ancillary notes.  Briefly,  Sabrina Blevins is a 26 y.o. female with medical history significant of morbid obesity, diabetes, bipolar disorder, mild intermittent asthma, presented with worsening of RUQ abdominal pain.   Patient has chronic recurrent symptomatic gallstone for the last 6+ months, with multiple episodes of RUQ pain and she has been followed by general surgery. Surgeon had scheduled patient for elective cholecystectomy, however it was found the patient glucose has been poorly controlled fingerstick> 300 and anesthesia had to cancel the procedure.   patient came back to ED complaining about worsening of RUQ abdominal pain, blood work showed trending up of LFTs and bilirubin.  MRCP negative for choledocholithiasis.  Pt was admitted for inpatient lap chole.   Symptomatic cholelithiasis S/p lap cholecystectomy on 08/13/21 with Dr. Claudine Mouton --MRCP negative for choledocholithiasis --pt had significant pain after surgery so was kept overnight for IVF and diet advancement.  The day after surgery, pt's pain was controlled and tolerating diet, so pt was discharged. --outpatient f/u with GenSurg in 2 weeks.   Acute transaminitis, improved Acute bilirubinemia, improved -suspect passing of CBD stone  IDDM, poorly controlled with hyperglycemia -A1c 9.2.  Pt was prescribed Victoza by PCP but only took 1 dose.   --Pt received glargine 15u daily and SSI while inpatient. --Pt did not wish to start insulin yet, and given that pt hasn't tried and failed non-insulin hypoglycemics, it's reasonable to try  other alternatives first. --pt was advised to take her Victoza.  Home metformin was increased from 500 mg daily to 1000 mg BID.  Added glipizide 10 mg BID.  3 months Rx of all 3 medications provided at discharge.  Pt is advised to follow up with PCP to check A1c and adjust diabetic regimen accordingly.   Morbid obesity, BMI 50 --Pt was advised to establish with outpatient weight loss center   Discharge Diagnoses:  Principal Problem:   Cholelithiases   30 Day Unplanned Readmission Risk Score    Flowsheet Row ED to Hosp-Admission (Current) from 08/11/2021 in [facility]  30 Day Unplanned Readmission Risk Score (%) 14.36 Filed at 08/14/2021 0801       This score is the patient's risk of an unplanned readmission within 30 days of being discharged (0 -100%). The score is based on dignosis, age, lab data, medications, orders, and past utilization.   Low:  0-14.9   Medium: 15-21.9   High: 22-29.9   Extreme: 30 and above         Discharge Instructions:  Allergies as of 08/14/2021       Reactions   Bee Venom Anaphylaxis   Cinnamon Anaphylaxis   Contrast Media [iodinated Contrast Media] Anaphylaxis   Depakote [valproic Acid] Other (See Comments)   Lowers blood glucose quickly    Motrin [ibuprofen] Anaphylaxis   Nutmeg Oil (myristica Oil) Anaphylaxis   Penicillins Anaphylaxis   Has patient had a PCN reaction causing immediate rash, facial/tongue/throat swelling, SOB or lightheadedness with hypotension: Yes Has patient had a PCN reaction causing severe rash involving mucus membranes or skin necrosis:  No Has patient had a PCN reaction that required hospitalization: Unknown Has patient had a PCN reaction occurring within the last 10 years: Unknown If all of the above answers are "NO", then may proceed with Cephalosporin use.   Benadryl [diphenhydramine Hcl] Swelling   Pt states reaction is to GENERIC formula only - throat swelling - tolerates liquid  benadryl and liquigels   Oxcarbazepine Other (See Comments)   Low bp, "bottoms out"   Topamax [topiramate] Other (See Comments)   Drops BP per mother        Medication List     TAKE these medications    cyclobenzaprine 5 MG tablet Commonly known as: FLEXERIL Take 1 tablet (5 mg total) by mouth 3 (three) times daily as needed for muscle spasms.   EPINEPHrine 0.3 mg/0.3 mL Soaj injection Commonly known as: EPI-PEN Inject 0.3 mg into the muscle as needed for anaphylaxis.   glipiZIDE 10 MG tablet Commonly known as: GLUCOTROL Take 1 tablet (10 mg total) by mouth 2 (two) times daily.   lipase/protease/amylase 41937 UNITS Cpep capsule Commonly known as: CREON Take 1-2 capsules by mouth See admin instructions. Take 2 capsules by mouth three times daily with meals and 1 capsule twice daily with snacks   liraglutide 18 MG/3ML Sopn Commonly known as: VICTOZA Inject 0.6 mg into the skin daily.   metFORMIN 500 MG tablet Commonly known as: GLUCOPHAGE Take 2 tablets (1,000 mg total) by mouth 2 (two) times daily. What changed: how much to take   ondansetron 4 MG disintegrating tablet Commonly known as: ZOFRAN-ODT Take 1 tablet (4 mg total) by mouth every 8 (eight) hours as needed.   oxyCODONE-acetaminophen 5-325 MG tablet Commonly known as: Percocet Take 1 tablet by mouth every 6 (six) hours as needed for severe pain or moderate pain.   pantoprazole 20 MG tablet Commonly known as: Protonix Take 1 tablet (20 mg total) by mouth daily.         Follow-up Information     Donovan Kail, PA-C. Schedule an appointment as soon as possible for a visit in 2 week(s).   Specialty: Physician Assistant Contact information: [address] [place] [address] [phone]                 Allergies  Allergen Reactions   Bee Venom Anaphylaxis   Cinnamon Anaphylaxis   Contrast Media [Iodinated Contrast Media] Anaphylaxis   Depakote [Valproic Acid] Other (See  Comments)    Lowers blood glucose quickly    Motrin [Ibuprofen] Anaphylaxis   Nutmeg Oil (Myristica Oil) Anaphylaxis   Penicillins Anaphylaxis    Has patient had a PCN reaction causing immediate rash, facial/tongue/throat swelling, SOB or lightheadedness with hypotension: Yes Has patient had a PCN reaction causing severe rash involving mucus membranes or skin necrosis: No Has patient had a PCN reaction that required hospitalization: Unknown Has patient had a PCN reaction occurring within the last 10 years: Unknown If all of the above answers are "NO", then may proceed with Cephalosporin use.    Benadryl [Diphenhydramine Hcl] Swelling    Pt states reaction is to GENERIC formula only - throat swelling - tolerates liquid benadryl and liquigels   Oxcarbazepine Other (See Comments)    Low bp, "bottoms out"   Topamax [Topiramate] Other (See Comments)    Drops BP per mother     The results of significant diagnostics from this hospitalization (including imaging, microbiology, ancillary and laboratory) are listed below for reference.   Consultations:  Procedures/Studies: MR ABDOMEN MRCP WO CONTRAST  Result Date: 08/11/2021 CLINICAL DATA:  Right upper quadrant pain with cholelithiasis. EXAM: MRI ABDOMEN WITHOUT CONTRAST  (INCLUDING MRCP) TECHNIQUE: Multiplanar multisequence MR imaging of the abdomen was performed. Heavily T2-weighted images of the biliary and pancreatic ducts were obtained, and three-dimensional MRCP images were rendered by post processing. COMPARISON:  Ultrasound exam 08/10/2021 FINDINGS: Lower chest: Unremarkable. Hepatobiliary: Liver measures 25.4 cm craniocaudal length. No focal abnormality in the liver parenchyma. Diffuse loss of signal intensity in the liver parenchyma is compatible with fatty deposition. Numerous layering tiny 2-3 mm gallstones evident. No substantial gallbladder wall thickening. No substantial pericholecystic fluid. No intra or extrahepatic biliary duct  dilatation. No choledocholithiasis. Pancreas: No focal mass lesion. No dilatation of the main duct. No intraparenchymal cyst. No peripancreatic edema. Spleen:  No splenomegaly. No focal mass lesion. Adrenals/Urinary Tract: No adrenal nodule or mass. Kidneys unremarkable. Stomach/Bowel: Stomach is unremarkable. No gastric wall thickening. No evidence of outlet obstruction. Duodenum is normally positioned as is the ligament of Treitz. No small bowel wall thickening. No small bowel or colonic dilatation within the visualized abdomen. Vascular/Lymphatic: No abdominal aortic aneurysm. No abdominal lymphadenopathy. Other:  No intraperitoneal free fluid. Musculoskeletal: No suspicious marrow signal abnormality IMPRESSION: 1. Cholelithiasis without substantial gallbladder wall thickening or pericholecystic fluid. No intra or extrahepatic biliary duct dilatation. No choledocholithiasis. 2. Hepatic steatosis. Electronically Signed   By: Kennith Center M.D.   On: 08/11/2021 05:14   US ABDOMEN LIMITED RUQ (LIVER/GB)  Result Date: 08/10/2021 CLINICAL DATA:  Right upper quadrant pain. EXAM: ULTRASOUND ABDOMEN LIMITED RIGHT UPPER QUADRANT COMPARISON:  Right upper quadrant abdominal ultrasound 07/20/2021 FINDINGS: Gallbladder: There again multiple gallstones measuring up to 8 mm. No sonographic Murphy's sign. No gallbladder wall thickening, pericholecystic fluid. Common bile duct: Diameter: 4 to 7 mm. No intrahepatic or extrahepatic biliary ductal dilatation. Liver: No focal lesion identified. Smooth liver contours. There is again increased echogenicity and attenuation of the liver parenchyma. Within normal limits in parenchymal echogenicity. Portal vein is patent on color Doppler imaging with normal direction of blood flow towards the liver. Other: None. IMPRESSION: 1. No significant change from prior. 2. Cholelithiasis. 3. Fatty infiltration of the liver. Electronically Signed   By: Neita Garnet M.D.   On: 08/10/2021 12:05    US ABDOMEN LIMITED RUQ (LIVER/GB)  Result Date: 07/20/2021 CLINICAL DATA:  Right upper quadrant pain for 24 hours with elevated labs. EXAM: ULTRASOUND ABDOMEN LIMITED RIGHT UPPER QUADRANT COMPARISON:  03/10/2021. FINDINGS: Gallbladder: Tiny gallstones measure up to 5 mm. No wall thickening or sonographic Murphy sign. Common bile duct: Diameter: 2 mm, within normal limits. No intrahepatic biliary ductal dilatation. Liver: Diffusely increased in echogenicity. Portal vein is patent on color Doppler imaging with normal direction of blood flow towards the liver. Other: None. IMPRESSION: 1. No acute findings. 2. Cholelithiasis. 3. Hepatic steatosis. Electronically Signed   By: Leanna Battles M.D.   On: 07/20/2021 13:42      Labs: BNP (last 3 results) No results for input(s): "BNP" in the last 8760 hours. Basic Metabolic Panel: Recent Labs  Lab 08/08/21 1003 08/10/21 1105 08/11/21 0102 08/13/21 0505 08/14/21 0423  NA 135 134* 135 138 138  K 3.8 4.4 3.6 3.8 3.6  CL 101 99 100 108 109  CO2 24 25 24 27 23   GLUCOSE 384* 327* 309* 185* 253*  BUN 9 10 8  <5* 5*  CREATININE 0.68 0.63 0.54 0.48 0.47  CALCIUM 9.1 9.8 9.0 8.3* 8.5*  MG  --   --   --  1.9 1.8   Liver Function Tests: Recent Labs  Lab 08/08/21 1003 08/10/21 1105 08/11/21 0102 08/12/21 0551  AST 26 277* 380* 171*  ALT 25 131* 228* 241*  ALKPHOS 86 121 135* 123  BILITOT 0.6 1.9* 2.5* 1.0  PROT 7.1 7.9 7.4 5.8*  ALBUMIN 3.5 3.9 3.7 2.8*   Recent Labs  Lab 08/10/21 1105 08/11/21 0102  LIPASE 32 35   No results for input(s): "AMMONIA" in the last 168 hours. CBC: Recent Labs  Lab 08/08/21 1003 08/10/21 1105 08/11/21 0102 08/12/21 0551 08/13/21 0505 08/14/21 0423  WBC 15.1* 11.9* 11.9* 11.2* 10.4 15.7*  NEUTROABS 10.3*  --  8.5*  --   --   --   HGB 13.6 14.6 14.0 12.8 12.0 12.5  HCT 40.7 44.0 41.4 38.8 37.9 38.2  MCV 81.6 82.1 80.5 84.3 85.6 84.7  PLT 352 373 356 295 274 307   Cardiac Enzymes: No results for  input(s): "CKTOTAL", "CKMB", "CKMBINDEX", "TROPONINI" in the last 168 hours. BNP: Invalid input(s): "POCBNP" CBG: Recent Labs  Lab 08/13/21 1124 08/13/21 1227 08/13/21 1626 08/13/21 2051 08/14/21 0758  GLUCAP 236* 287* 238* 306* 220*   D-Dimer No results for input(s): "DDIMER" in the last 72 hours. Hgb A1c No results for input(s): "HGBA1C" in the last 72 hours. Lipid Profile No results for input(s): "CHOL", "HDL", "LDLCALC", "TRIG", "CHOLHDL", "LDLDIRECT" in the last 72 hours. Thyroid function studies No results for input(s): "TSH", "T4TOTAL", "T3FREE", "THYROIDAB" in the last 72 hours.  Invalid input(s): "FREET3" Anemia work up No results for input(s): "VITAMINB12", "FOLATE", "FERRITIN", "TIBC", "IRON", "RETICCTPCT" in the last 72 hours. Urinalysis    Component Value Date/Time   COLORURINE AMBER (A) 08/11/2021 0312   APPEARANCEUR CLEAR (A) 08/11/2021 0312   LABSPEC 1.024 08/11/2021 0312   PHURINE 5.0 08/11/2021 0312   GLUCOSEU >=500 (A) 08/11/2021 0312   HGBUR LARGE (A) 08/11/2021 0312   BILIRUBINUR NEGATIVE 08/11/2021 0312   KETONESUR 20 (A) 08/11/2021 0312   PROTEINUR NEGATIVE 08/11/2021 0312   NITRITE NEGATIVE 08/11/2021 0312   LEUKOCYTESUR NEGATIVE 08/11/2021 0312   Sepsis Labs Recent Labs  Lab 08/11/21 0102 08/12/21 0551 08/13/21 0505 08/14/21 0423  WBC 11.9* 11.2* 10.4 15.7*   Microbiology No results found for this or any previous visit (from the past 240 hour(s)).   Total time spend on discharging this patient, including the last patient exam, discussing the hospital stay, instructions for ongoing care as it relates to all pertinent caregivers, as well as preparing the medical discharge records, prescriptions, and/or referrals as applicable, is 45 minutes.    Darlin Priestly, MD  [facility] 08/14/2021, 8:56 AM

## 2021-08-15 NOTE — Anesthesia Postprocedure Evaluation (Signed)
Anesthesia Post Note  Patient: Sabrina Blevins  Procedure(s) Performed: XI ROBOTIC ASSISTED LAPAROSCOPIC CHOLECYSTECTOMY INDOCYANINE GREEN FLUORESCENCE IMAGING (ICG)  Patient location during evaluation: PACU Anesthesia Type: General Level of consciousness: awake and alert Pain management: pain level controlled Vital Signs Assessment: post-procedure vital signs reviewed and stable Respiratory status: spontaneous breathing, nonlabored ventilation, respiratory function stable and patient connected to nasal cannula oxygen Cardiovascular status: blood pressure returned to baseline and stable Postop Assessment: no apparent nausea or vomiting Anesthetic complications: no   No notable events documented.   Last Vitals:  Vitals:   08/14/21 0451 08/14/21 0800  BP: (!) 144/87 119/73  Pulse: 81 88  Resp: 16 20  Temp: 36.8 C 36.6 C  SpO2: 97% 98%    Last Pain:  Vitals:   08/14/21 0900  TempSrc:   PainSc: 0-No pain                 Yevette Edwards

## 2021-08-16 DIAGNOSIS — R1110 Vomiting, unspecified: Secondary | ICD-10-CM | POA: Insufficient documentation

## 2021-08-16 DIAGNOSIS — Z5321 Procedure and treatment not carried out due to patient leaving prior to being seen by health care provider: Secondary | ICD-10-CM | POA: Diagnosis not present

## 2021-08-16 DIAGNOSIS — G8918 Other acute postprocedural pain: Secondary | ICD-10-CM | POA: Insufficient documentation

## 2021-08-16 DIAGNOSIS — R109 Unspecified abdominal pain: Secondary | ICD-10-CM | POA: Diagnosis present

## 2021-08-16 DIAGNOSIS — R519 Headache, unspecified: Secondary | ICD-10-CM | POA: Insufficient documentation

## 2021-08-16 DIAGNOSIS — R739 Hyperglycemia, unspecified: Secondary | ICD-10-CM | POA: Insufficient documentation

## 2021-08-16 DIAGNOSIS — R42 Dizziness and giddiness: Secondary | ICD-10-CM | POA: Insufficient documentation

## 2021-08-16 DIAGNOSIS — F419 Anxiety disorder, unspecified: Secondary | ICD-10-CM | POA: Diagnosis not present

## 2021-08-16 DIAGNOSIS — D72829 Elevated white blood cell count, unspecified: Secondary | ICD-10-CM | POA: Diagnosis not present

## 2021-08-16 DIAGNOSIS — R0789 Other chest pain: Secondary | ICD-10-CM | POA: Insufficient documentation

## 2021-08-16 LAB — CBC
HCT: 42.1 % (ref 36.0–46.0)
Hemoglobin: 13.7 g/dL (ref 12.0–15.0)
MCH: 27.6 pg (ref 26.0–34.0)
MCHC: 32.5 g/dL (ref 30.0–36.0)
MCV: 84.7 fL (ref 80.0–100.0)
Platelets: 326 10*3/uL (ref 150–400)
RBC: 4.97 MIL/uL (ref 3.87–5.11)
RDW: 12.9 % (ref 11.5–15.5)
WBC: 16 10*3/uL — ABNORMAL HIGH (ref 4.0–10.5)
nRBC: 0 % (ref 0.0–0.2)

## 2021-08-16 LAB — BASIC METABOLIC PANEL
Anion gap: 12 (ref 5–15)
BUN: 9 mg/dL (ref 6–20)
CO2: 27 mmol/L (ref 22–32)
Calcium: 9.4 mg/dL (ref 8.9–10.3)
Chloride: 98 mmol/L (ref 98–111)
Creatinine, Ser: 0.66 mg/dL (ref 0.44–1.00)
GFR, Estimated: >60 mL/min (ref >60)
Glucose, Bld: 209 mg/dL — ABNORMAL HIGH (ref 70–99)
Potassium: 3.6 mmol/L (ref 3.5–5.1)
Sodium: 137 mmol/L (ref 135–145)

## 2021-08-16 NOTE — ED Provider Triage Note (Signed)
Emergency Medicine Provider Triage Evaluation Note  Sabrina Blevins , a 26 y.o. female  was evaluated in triage.  Pt complains of abdominal pain and erythema of incision site from cholecystectomy 4 days ago. No fever..   Physical Exam  LMP 08/10/2021 (Approximate)  Gen:   Awake, no distress   Resp:  Normal effort  MSK:   Moves extremities without difficulty  Other:    Medical Decision Making  Medically screening exam initiated at 6:44 PM.  Appropriate orders placed.  Sabrina Blevins was informed that the remainder of the evaluation will be completed by another provider, this initial triage assessment does not replace that evaluation, and the importance of remaining in the ED until their evaluation is complete.    Chinita Pester, FNP 08/16/21 1844

## 2021-08-16 NOTE — ED Notes (Signed)
One set of cultures and lactic sent if needed

## 2021-08-16 NOTE — ED Triage Notes (Signed)
Pt comes pov for possible cellulitis after gallbladder surgery 4 days ago. Pt does not want any narcotics and has not taken her pain meds today due to how it made her feel. Pt has reddened area around belly button incision that is hot to the touch. Pt had tylenol at about 1pm.

## 2021-08-17 ENCOUNTER — Emergency Department: Payer: Medicaid Other

## 2021-08-17 ENCOUNTER — Other Ambulatory Visit: Payer: Self-pay

## 2021-08-17 ENCOUNTER — Emergency Department
Admission: EM | Admit: 2021-08-17 | Discharge: 2021-08-17 | Disposition: A | Discharge status: Home / Self Care | Payer: Medicaid Other | Attending: Emergency Medicine | Admitting: Emergency Medicine

## 2021-08-17 ENCOUNTER — Emergency Department
Admission: EM | Admit: 2021-08-17 | Discharge: 2021-08-17 | Discharge status: Against Medical Advice | Payer: Medicaid Other | Source: Home / Self Care

## 2021-08-17 DIAGNOSIS — R1110 Vomiting, unspecified: Secondary | ICD-10-CM | POA: Insufficient documentation

## 2021-08-17 DIAGNOSIS — R42 Dizziness and giddiness: Secondary | ICD-10-CM | POA: Insufficient documentation

## 2021-08-17 DIAGNOSIS — Z5321 Procedure and treatment not carried out due to patient leaving prior to being seen by health care provider: Secondary | ICD-10-CM | POA: Insufficient documentation

## 2021-08-17 DIAGNOSIS — F419 Anxiety disorder, unspecified: Secondary | ICD-10-CM | POA: Insufficient documentation

## 2021-08-17 DIAGNOSIS — R0789 Other chest pain: Secondary | ICD-10-CM | POA: Insufficient documentation

## 2021-08-17 DIAGNOSIS — G8918 Other acute postprocedural pain: Secondary | ICD-10-CM

## 2021-08-17 DIAGNOSIS — R519 Headache, unspecified: Secondary | ICD-10-CM | POA: Insufficient documentation

## 2021-08-17 LAB — HEPATIC FUNCTION PANEL
ALT: 61 U/L — ABNORMAL HIGH (ref 0–44)
AST: 24 U/L (ref 15–41)
Albumin: 3.8 g/dL (ref 3.5–5.0)
Alkaline Phosphatase: 94 U/L (ref 38–126)
Bilirubin, Direct: 0.1 mg/dL (ref 0.0–0.2)
Indirect Bilirubin: 0.7 mg/dL (ref 0.3–0.9)
Total Bilirubin: 0.8 mg/dL (ref 0.3–1.2)
Total Protein: 7.3 g/dL (ref 6.5–8.1)

## 2021-08-17 LAB — POC URINE PREG, ED: Preg Test, Ur: NEGATIVE

## 2021-08-17 LAB — LIPASE, BLOOD: Lipase: 30 U/L (ref 11–51)

## 2021-08-17 LAB — CBG MONITORING, ED: Glucose-Capillary: 154 mg/dL — ABNORMAL HIGH (ref 70–99)

## 2021-08-17 MED ORDER — CLINDAMYCIN HCL 300 MG PO CAPS: 300.0000 mg | ORAL_CAPSULE | Freq: Three times a day (TID) | ORAL | 0 refills | Status: AC

## 2021-08-17 NOTE — ED Triage Notes (Addendum)
Pt states she accidentally took prescribed doses of oxycodone and cyclobenzaprine together, states she mistook the later for antibiotic. Pt Aox4, ambulatory. Pt reports episode of emesis, lightheadedness, headache, and dizziness, chest tightness- states s/s could be due to anxiety over taking wrong medication. PT denies SI/HI- states was purely accidental.

## 2021-08-17 NOTE — ED Notes (Signed)
Pt ambulatory from lobby with SO in no acute distress.

## 2021-08-17 NOTE — ED Notes (Signed)
Pt states she feels "a lot better" and wishes to leave, MSE reviewed with pt.

## 2021-08-17 NOTE — ED Notes (Signed)
Pt has not returned to lobby 

## 2021-08-17 NOTE — ED Provider Notes (Signed)
[facility] Provider Note    Event Date/Time   First MD Initiated Contact with Patient 08/17/21 0112     (approximate)   History   Post-op Problem   HPI  Kaycie Pegues is a 26 y.o. female with a history of cholelithiasis status postcholecystectomy several days ago who presents with persistent right-sided abdominal pain as well as a burning and hot sensation around the incisions.  She states that she was prescribed oxycodone although does not feel well when she takes it and would prefer not to take it.  She denies any fever or chills.  She has not had any vomiting or diarrhea.      Physical Exam   Triage Vital Signs: ED Triage Vitals [08/16/21 1844]  Enc Vitals Group     BP (!) 138/91     Pulse Rate 90     Resp 18     Temp 98.3 F (36.8 C)     Temp Source Oral     SpO2 100 %     Weight      Height      Head Circumference      Peak Flow      Pain Score 10     Pain Loc      Pain Edu?      Excl. in GC?     Most recent vital signs: Vitals:   08/17/21 0117 08/17/21 0414  BP: (!) 93/42 133/72  Pulse: 78 76  Resp: 18 16  Temp:    SpO2: 98% 96%     General: Awake, no distress.  CV:  Good peripheral perfusion.  Resp:  Normal effort.  Abd:  Soft with moderate right upper quadrant and right mid abdominal tenderness.  The remainder of the abdomen is soft and nontender.  No peritoneal signs. Other:  Laparoscopic incision sites to bilateral sides of the abdomen and the umbilical area clean dry and intact.  Umbilical site has an area of approximately 10 cm of faint erythema and induration around it.   ED Results / Procedures / Treatments   Labs (all labs ordered are listed, but only abnormal results are displayed) Labs Reviewed  CBC - Abnormal; Notable for the following components:      Result Value   WBC 16.0 (*)    All other components within normal limits  BASIC METABOLIC PANEL - Abnormal; Notable for the following components:    Glucose, Bld 209 (*)    All other components within normal limits  HEPATIC FUNCTION PANEL - Abnormal; Notable for the following components:   ALT 61 (*)    All other components within normal limits  CBG MONITORING, ED - Abnormal; Notable for the following components:   Glucose-Capillary 154 (*)    All other components within normal limits  LIPASE, BLOOD  POC URINE PREG, ED     EKG     RADIOLOGY  CT abdomen/pelvis: I independently viewed and interpreted the images; there is no evidence of abscess, fluid collection, bowel obstruction, or other acute abnormality  PROCEDURES:  Critical Care performed: No  Procedures   MEDICATIONS ORDERED IN ED: Medications - No data to display   IMPRESSION / MDM / ASSESSMENT AND PLAN / ED COURSE  I reviewed the triage vital signs and the nursing notes.  26 year old female with PMH as noted above presents with persistent right sided abdominal pain as well as incision site pain after a cholecystectomy several days ago.  I reviewed the past  medical records.  I confirmed that the patient had a laparoscopic cholecystectomy on 6/26 by Dr. Claudine Mouton which was uncomplicated.  On exam the patient is overall well-appearing.  Her vital signs are normal.  She is afebrile.  The abdomen is soft but she does have some right upper mid abdominal tenderness.  There is faint erythema around the periumbilical incision site.  Differential diagnosis includes, but is not limited to, normal postoperative pain, seroma, hematoma, abscess, infection, perforation or other postoperative complication.  The area around the incision site is consistent with mild cellulitis.  Patient's presentation is most consistent with acute presentation with potential threat to life or bodily function.  Lab work-up shows leukocytosis.  Electrolytes are unremarkable.  We will obtain a CT abdomen/pelvis to further evaluate.  The patient reports a severe contrast allergy and declines CT  with a pretreatment protocol so we will obtain a noncontrast CT although this may be somewhat limited.  ----------------------------------------- 5:12 AM on 08/17/2021 -----------------------------------------  I added on LFTs and lipase which are normal.  CT shows no evidence of abscess, other fluid collection, or other concerning postoperative findings.  There is infiltration in the subcutaneous fat of the anterior abdominal wall which is normal for a postoperative patient.  I consulted Dr. Maia Plan from general surgery and discussed the patient with him.  He agrees that he would start antibiotics for possible cellulitis but that otherwise the patient was appropriate for discharge home with close follow-up.  He recommended that she call Dr. Benson Norway office in the morning.  On reassessment, the patient was comfortable.  I counseled her on the results of the work-up and plan of care.  She is in agreement.  She feels well to go home.  I gave her thorough return precautions and she expressed understanding.  The patient states that she already has tramadol at home and is willing to try it instead of the oxycodone.  I feel that she may tolerate this better.  Given her strong penicillin allergy she is hesitant to try cephalosporins so I will give clindamycin for treatment of cellulitis.  I instructed her to call Dr. Claudine Mouton to arrange for follow-up and she agrees.   FINAL CLINICAL IMPRESSION(S) / ED DIAGNOSES   Final diagnoses:  Post-operative pain     Rx / DC Orders   ED Discharge Orders          Ordered    clindamycin (CLEOCIN) 300 MG capsule  3 times daily        08/17/21 2979             Note:  This document was prepared using Dragon voice recognition software and may include unintentional dictation errors.    Dionne Bucy, MD 08/17/21 [phone]

## 2021-08-17 NOTE — Discharge Instructions (Addendum)
Take the tramadol that you have at home, 50 mg up to every 6 hours as needed for pain.  Take the antibiotic prescribed today and finish the full 7-day course.  Call Dr. Benson Norway office today to inform him of the symptoms you are having and arrange for follow-up.  Return to the ER immediately for new, worsening, or persistent severe pain, vomiting, fever, worsening of the rash to your abdomen, any opening of the wounds, drainage of pus or other fluid, or any other new or worsening symptoms that concern you.

## 2021-08-20 ENCOUNTER — Emergency Department
Admission: EM | Admit: 2021-08-20 | Discharge: 2021-08-20 | Disposition: A | Discharge status: Home / Self Care | Payer: Medicaid Other | Attending: Emergency Medicine | Admitting: Emergency Medicine

## 2021-08-20 ENCOUNTER — Emergency Department: Payer: Medicaid Other

## 2021-08-20 DIAGNOSIS — J45909 Unspecified asthma, uncomplicated: Secondary | ICD-10-CM | POA: Insufficient documentation

## 2021-08-20 DIAGNOSIS — S0990XA Unspecified injury of head, initial encounter: Secondary | ICD-10-CM | POA: Diagnosis present

## 2021-08-20 DIAGNOSIS — W228XXA Striking against or struck by other objects, initial encounter: Secondary | ICD-10-CM | POA: Insufficient documentation

## 2021-08-20 DIAGNOSIS — S060X0A Concussion without loss of consciousness, initial encounter: Secondary | ICD-10-CM

## 2021-08-20 DIAGNOSIS — E119 Type 2 diabetes mellitus without complications: Secondary | ICD-10-CM | POA: Diagnosis not present

## 2021-08-20 MED ORDER — ONDANSETRON 4 MG PO TBDP
4.0000 mg | ORAL_TABLET | Freq: Once | ORAL | Status: AC
Start: 2021-08-20 — End: 2021-08-20
  Administered 2021-08-20: 4 mg via ORAL
  Filled 2021-08-20: qty 1

## 2021-08-20 MED ORDER — ACETAMINOPHEN 325 MG PO TABS
650.0000 mg | ORAL_TABLET | Freq: Once | ORAL | Status: AC
  Administered 2021-08-20: 650 mg via ORAL
  Filled 2021-08-20: qty 2
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
## 2021-08-20 NOTE — Discharge Instructions (Addendum)
-  Please review the educational material provided.  Avoid activities that exacerbate your symptoms.  Follow-up with your primary care provider as needed.  -You may take Tylenol or ibuprofen as needed for pain.  -Return to the emergency department anytime if you begin to experience any new or worsening symptoms.

## 2021-08-30 ENCOUNTER — Encounter: Payer: Medicaid Other | Admitting: Physician Assistant

## 2021-09-04 ENCOUNTER — Encounter: Payer: Self-pay | Admitting: Physician Assistant

## 2021-09-04 ENCOUNTER — Ambulatory Visit (INDEPENDENT_AMBULATORY_CARE_PROVIDER_SITE_OTHER): Payer: Medicaid Other | Admitting: Physician Assistant

## 2021-09-04 VITALS — BP 130/83 | HR 97 | Temp 98.2°F | Wt 318.4 lb

## 2021-09-04 DIAGNOSIS — Z09 Encounter for follow-up examination after completed treatment for conditions other than malignant neoplasm: Secondary | ICD-10-CM

## 2021-09-04 DIAGNOSIS — K8010 Calculus of gallbladder with chronic cholecystitis without obstruction: Secondary | ICD-10-CM

## 2021-09-04 NOTE — Progress Notes (Signed)
[facility] POST-OP OFFICE VISIT  09/04/2021  HPI: Sabrina Blevins is a 26 y.o. female 22 days s/p robotic assisted laparoscopic cholecystectomy for [facility] with Dr Claudine Mouton   She is overall doing well She does have induration and soreness at the camera port site; worse with movement; using tylenol. She is worried about a hernia No fever, chills, nausea, emesis Some initial diarrhea but this has resolved No other complaints   Vital signs: BP 130/83   Pulse 97   Temp 98.2 F (36.8 C) (Oral)   Wt (!) 318 lb 6.4 oz (144.4 kg)   LMP 08/10/2021 (Approximate)   SpO2 99%   BMI 48.41 kg/m    Physical Exam: Constitutional: Well appearing female, NAD Abdomen: Obese, Soft, tenderness over the camera port otherwise non-tender, non-distended, no rebound/guarding Skin: Laparoscopic incisions are healing well, no erythema or drainage. At the camera port site there is an expected amount of induration present for this point in the post-operative course, no evidence of hernia   Assessment/Plan: This is a 25 y.o. female 22 days s/p robotic assisted laparoscopic cholecystectomy for [facility]    - Pain control prn  - Reviewed wound care recommendation  - Reviewed lifting restrictions; 4-6 weeks total  - Reviewed surgical pathology; Acute on chronic cholecystitis  - She can follow up on as needed basis; She understands to call with questions/concerns  -- Lynden Oxford, PA-C [facility] 09/04/2021, 3:12 PM M-F: 7am - 4pm

## 2021-09-04 NOTE — Patient Instructions (Signed)

## 2022-06-23 ENCOUNTER — Other Ambulatory Visit: Payer: Self-pay

## 2022-06-23 ENCOUNTER — Emergency Department
Admission: EM | Admit: 2022-06-23 | Discharge: 2022-06-24 | Disposition: A | Discharge status: Home / Self Care | Payer: Medicaid Other | Attending: Emergency Medicine | Admitting: Emergency Medicine

## 2022-06-23 DIAGNOSIS — E119 Type 2 diabetes mellitus without complications: Secondary | ICD-10-CM | POA: Diagnosis not present

## 2022-06-23 DIAGNOSIS — T782XXA Anaphylactic shock, unspecified, initial encounter: Secondary | ICD-10-CM | POA: Diagnosis not present

## 2022-06-23 DIAGNOSIS — T7840XA Allergy, unspecified, initial encounter: Secondary | ICD-10-CM | POA: Diagnosis present

## 2022-06-23 MED ORDER — DEXAMETHASONE SODIUM PHOSPHATE 10 MG/ML IJ SOLN
10.0000 mg | Freq: Once | INTRAMUSCULAR | Status: AC
  Administered 2022-06-23: 10 mg via INTRAVENOUS
  Filled 2022-06-23: qty 1

## 2022-06-23 MED ORDER — FAMOTIDINE IN NACL 20-0.9 MG/50ML-% IV SOLN
20.0000 mg | Freq: Once | INTRAVENOUS | Status: AC
  Administered 2022-06-23: 20 mg via INTRAVENOUS
  Filled 2022-06-23: qty 50

## 2022-06-23 MED ORDER — PREDNISONE 20 MG PO TABS: 40.0000 mg | ORAL_TABLET | Freq: Every day | ORAL | 0 refills | Status: AC

## 2022-06-23 MED ORDER — EPINEPHRINE 0.3 MG/0.3ML IJ SOAJ
INTRAMUSCULAR | Status: AC
  Filled 2022-06-23: qty 0.3

## 2022-06-23 MED ORDER — EPINEPHRINE 0.3 MG/0.3ML IJ SOAJ
0.3000 mg | Freq: Once | INTRAMUSCULAR | Status: AC
Start: 2022-06-23 — End: 2022-06-23
  Administered 2022-06-23: 0.3 mg via INTRAMUSCULAR

## 2022-06-23 MED ORDER — DIPHENHYDRAMINE HCL 25 MG PO CAPS: 50.0000 mg | ORAL_CAPSULE | Freq: Four times a day (QID) | ORAL | 0 refills | Status: AC

## 2022-06-23 MED ORDER — EPINEPHRINE 0.3 MG/0.3ML IJ SOAJ: 0.3000 mg | INTRAMUSCULAR | 3 refills | Status: AC | PRN

## 2022-06-23 NOTE — ED Provider Notes (Signed)
[facility] Provider Note    Event Date/Time   First MD Initiated Contact with Patient 06/23/22 2305     (approximate)   History   Chief Complaint: Allergic Reaction   HPI  Sabrina Blevins is a 27 y.o. female with a past history of diabetes, obesity, bipolar disorder who comes ED complaining of chest tightness, facial swelling, skin itchiness that started shortly after eating a white chocolate raspberry truffle dessert.  She has multiple food allergies.  She took Benadryl 50 mg at home.  Prior to this she was in her usual state of health.     Physical Exam   Triage Vital Signs: ED Triage Vitals  Enc Vitals Group     BP 06/23/22 2211 (!) 140/100     Pulse Rate 06/23/22 2211 99     Resp 06/23/22 2211 18     Temp 06/23/22 2211 98.4 F (36.9 C)     Temp Source 06/23/22 2211 Oral     SpO2 06/23/22 2211 98 %     Weight --      Height --      Head Circumference --      Peak Flow --      Pain Score 06/23/22 2210 0     Pain Loc --      Pain Edu? --      Excl. in GC? --     Most recent vital signs: Vitals:   06/23/22 2211 06/23/22 2225  BP: (!) 140/100 122/80  Pulse: 99 91  Resp: 18 17  Temp: 98.4 F (36.9 C)   SpO2: 98% 99%    General: Awake, no distress.  CV:  Good peripheral perfusion.  Regular rate and rhythm Resp:  Normal effort.  Clear to auscultation without wheezing or stridor Abd:  No distention.  Soft nontender Other:  Strong voice, not muffled or hoarse.  Oropharynx normal without tongue swelling or elevation.  Floor mouth is soft.   ED Results / Procedures / Treatments   Labs (all labs ordered are listed, but only abnormal results are displayed) Labs Reviewed - No data to display   EKG    RADIOLOGY    PROCEDURES:  .Critical Care  Performed by: Sharman Cheek, MD Authorized by: Sharman Cheek, MD   Critical care provider statement:    Critical care time (minutes):  35   Critical care time was  exclusive of:  Separately billable procedures and treating other patients   Critical care was necessary to treat or prevent imminent or life-threatening deterioration of the following conditions:  Respiratory failure and circulatory failure   Critical care was time spent personally by me on the following activities:  Development of treatment plan with patient or surrogate, discussions with consultants, evaluation of patient's response to treatment, examination of patient, obtaining history from patient or surrogate, ordering and performing treatments and interventions, ordering and review of laboratory studies, ordering and review of radiographic studies, pulse oximetry, re-evaluation of patient's condition and review of old charts    MEDICATIONS ORDERED IN ED: Medications  EPINEPHrine (EPI-PEN) injection 0.3 mg (0.3 mg Intramuscular Given 06/23/22 2212)  dexamethasone (DECADRON) injection 10 mg (10 mg Intravenous Given 06/23/22 2228)  famotidine (PEPCID) IVPB 20 mg premix (20 mg Intravenous New Bag/Given 06/23/22 2232)     IMPRESSION / MDM / ASSESSMENT AND PLAN / ED COURSE  I reviewed the triage vital signs and the nursing notes.  Patient's presentation is most consistent with acute presentation with potential  threat to life or bodily function.  Patient presented to the ED with clinically apparent anaphylactic reaction.  On arrival she was given EpiPen along with Decadron and IV Pepcid.  On reassessment afterward, she has some facial flushing but otherwise symptoms are quickly improving.  Will continue to monitor to ensure no relapse of her anaphylactic symptoms.  Medication sent to her pharmacy.       FINAL CLINICAL IMPRESSION(S) / ED DIAGNOSES   Final diagnoses:  Anaphylaxis, initial encounter     Rx / DC Orders   ED Discharge Orders          Ordered    predniSONE (DELTASONE) 20 MG tablet  Daily with breakfast        06/23/22 2319    EPINEPHrine 0.3 mg/0.3 mL IJ SOAJ injection  As  needed        06/23/22 2319    diphenhydrAMINE (BENADRYL) 25 mg capsule  Every 6 hours       Note to Pharmacy: Dye free   06/23/22 2319    Ambulatory Referral to Primary Care (Establish Care)        06/23/22 2320             Note:  This document was prepared using Dragon voice recognition software and may include unintentional dictation errors.   Sharman Cheek, MD 06/23/22 2325

## 2022-06-23 NOTE — Discharge Instructions (Addendum)
Take medications as prescribed.  See your doctor this week for a follow-up visit.  Come back with any new or worsening symptoms.

## 2022-06-23 NOTE — ED Triage Notes (Addendum)
Pt had truffle raspberry ice-cream and c/o facial/lip swelling, tight chest, and itchiness x 30 mins ago. Pt took 2 benadryl pills. Pt has history of anaphylaxis.

## 2022-06-24 NOTE — ED Provider Notes (Signed)
Patient wants to leave now completely asymptomatic.  Plan for discharge return precautions given.   Pilar Jarvis, MD 06/24/22 [phone]

## 2022-08-09 ENCOUNTER — Ambulatory Visit: Payer: Medicaid Other | Admitting: Nurse Practitioner

## 2022-12-25 IMAGING — CR DG CHEST 2V
2 series · 2 of 2 positions shown · non-contrast
Comparison: Radiograph 06/01/2020

CLINICAL DATA: Chest pain.

EXAM:
CHEST - 2 VIEW

[chest pa]
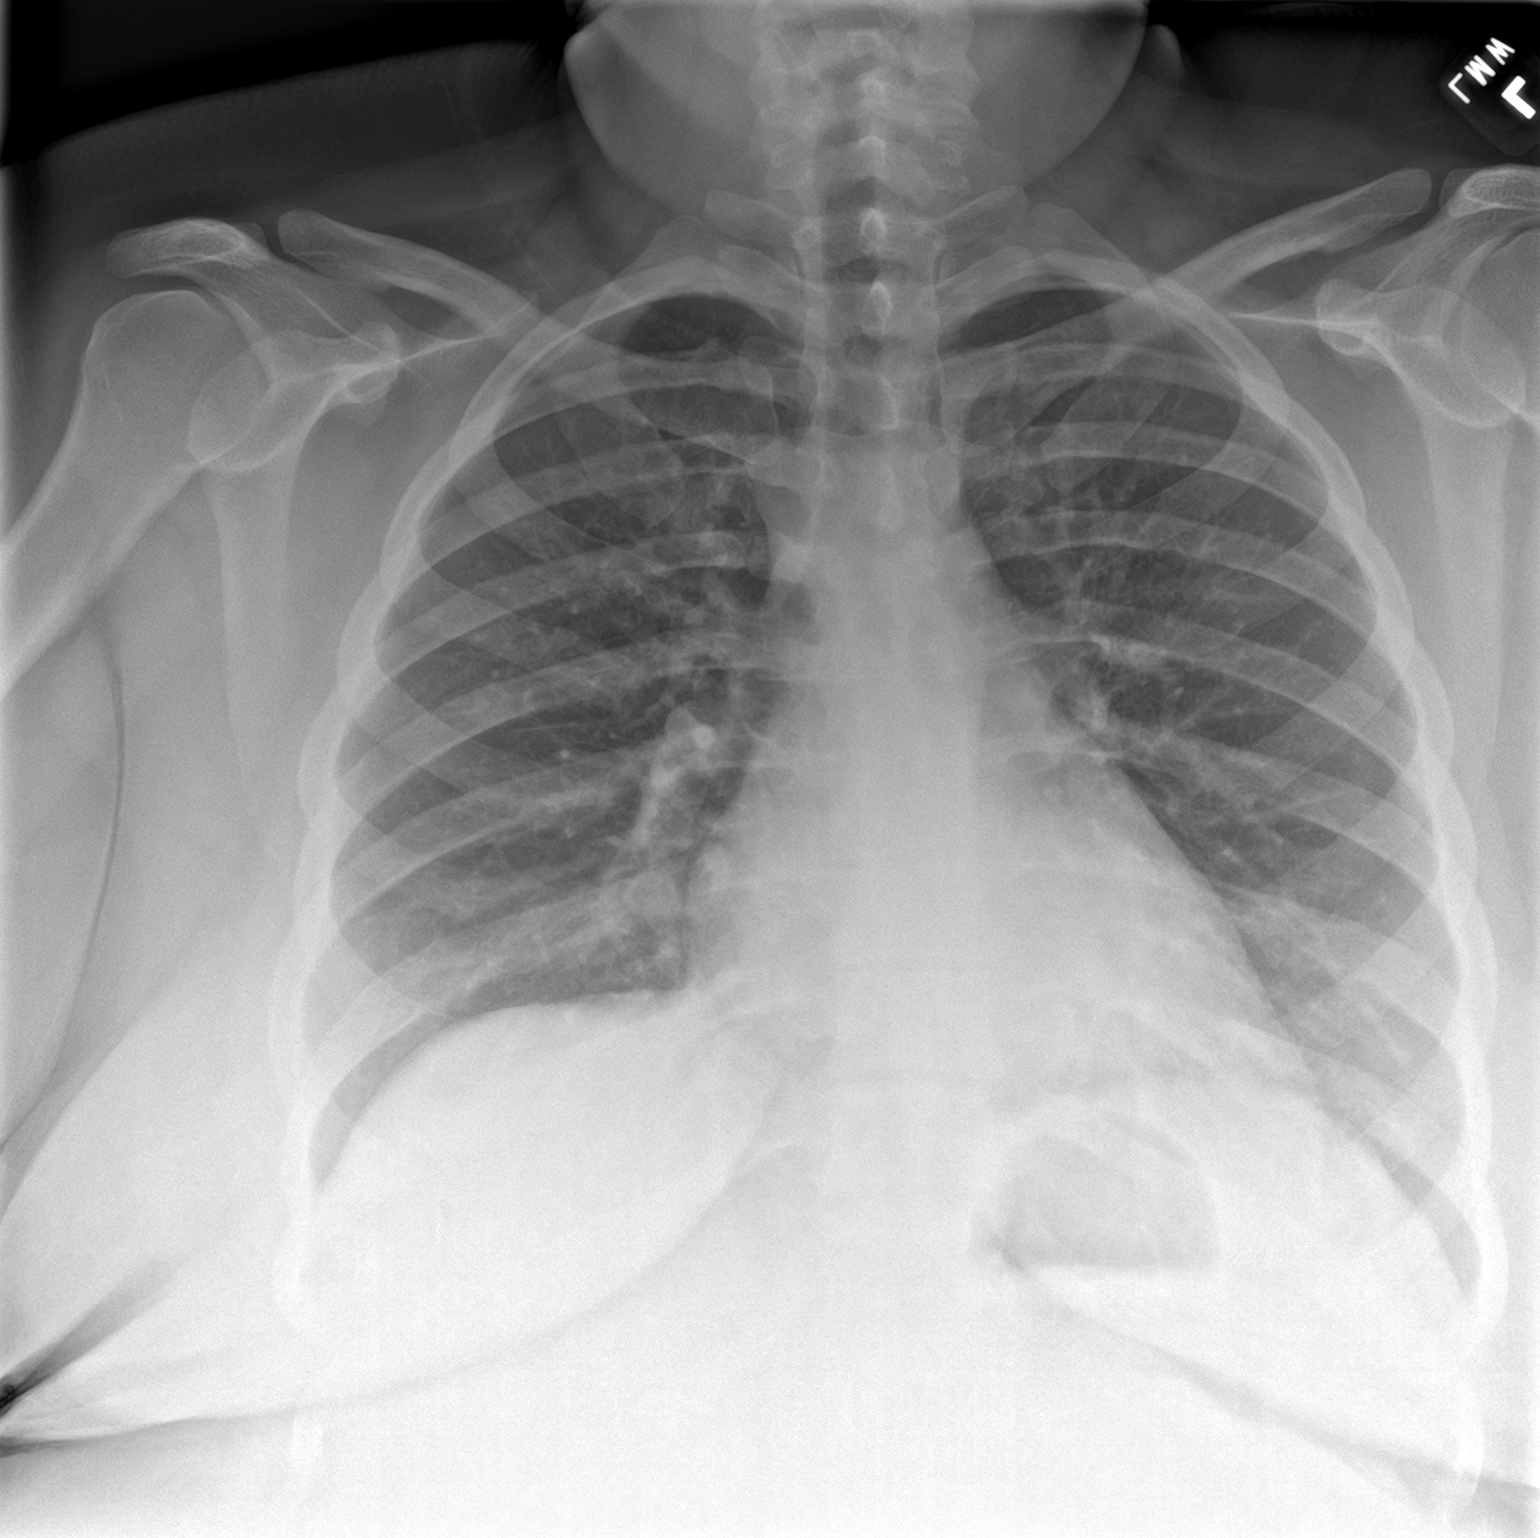

[chest lat]
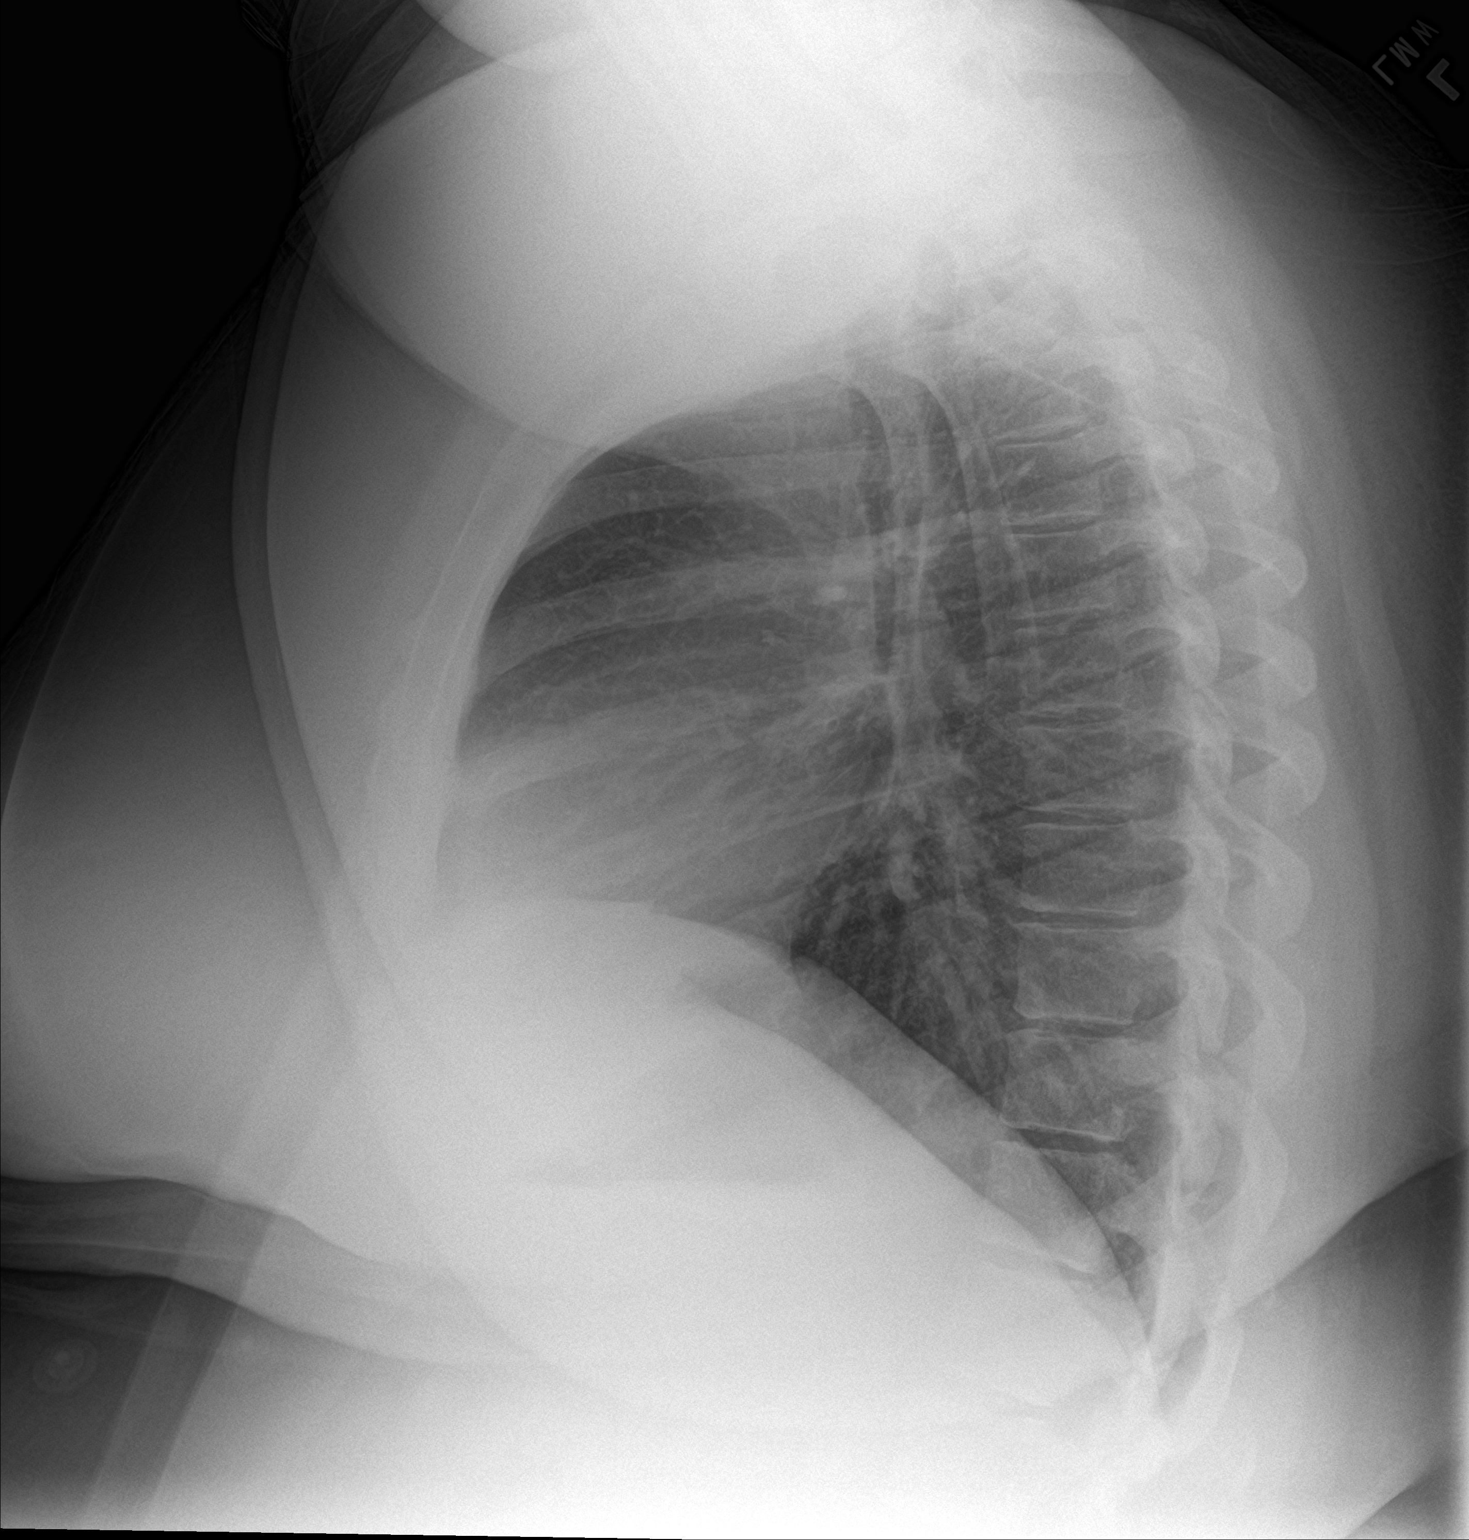

[2 of 2 positions shown; findings below may reference images not displayed]

FINDINGS: The cardiomediastinal contours are normal. The lungs are clear.
Pulmonary vasculature is normal. No consolidation, pleural effusion,
or pneumothorax. No acute osseous abnormalities are seen.
IMPRESSION: Negative radiographs of the chest.
